# Patient Record
Sex: Male | Born: 1960 | Race: Black or African American | Hispanic: No | Marital: Single | Smoking: Current every day smoker
Health system: Southern US, Community
[De-identification: ages and names within clinical notes are randomized; demographics above are authoritative.]

## PROBLEM LIST (undated history)

## (undated) DIAGNOSIS — K219 Gastro-esophageal reflux disease without esophagitis: Secondary | ICD-10-CM

## (undated) DIAGNOSIS — J45909 Unspecified asthma, uncomplicated: Secondary | ICD-10-CM

## (undated) DIAGNOSIS — F1020 Alcohol dependence, uncomplicated: Secondary | ICD-10-CM

## (undated) DIAGNOSIS — J449 Chronic obstructive pulmonary disease, unspecified: Secondary | ICD-10-CM

## (undated) DIAGNOSIS — I10 Essential (primary) hypertension: Secondary | ICD-10-CM

## (undated) DIAGNOSIS — F1910 Other psychoactive substance abuse, uncomplicated: Secondary | ICD-10-CM

---

## 2004-10-13 ENCOUNTER — Emergency Department (HOSPITAL_COMMUNITY): Admission: EM | Admit: 2004-10-13 | Discharge: 2004-10-13 | Payer: Self-pay | Admitting: Emergency Medicine

## 2004-10-15 ENCOUNTER — Emergency Department (HOSPITAL_COMMUNITY): Admission: EM | Admit: 2004-10-15 | Discharge: 2004-10-15 | Payer: Self-pay | Admitting: Emergency Medicine

## 2004-10-18 ENCOUNTER — Emergency Department (HOSPITAL_COMMUNITY): Admission: EM | Admit: 2004-10-18 | Discharge: 2004-10-18 | Payer: Self-pay | Admitting: Emergency Medicine

## 2004-10-25 ENCOUNTER — Emergency Department (HOSPITAL_COMMUNITY): Admission: EM | Admit: 2004-10-25 | Discharge: 2004-10-25 | Payer: Self-pay | Admitting: Emergency Medicine

## 2008-01-20 ENCOUNTER — Emergency Department (HOSPITAL_COMMUNITY): Admission: EM | Admit: 2008-01-20 | Discharge: 2008-01-20 | Payer: Self-pay | Admitting: Emergency Medicine

## 2014-03-24 ENCOUNTER — Encounter (HOSPITAL_COMMUNITY): Payer: Self-pay | Admitting: Emergency Medicine

## 2014-03-24 ENCOUNTER — Emergency Department (HOSPITAL_COMMUNITY)
Admission: EM | Admit: 2014-03-24 | Discharge: 2014-03-24 | Disposition: A | Discharge status: Home / Self Care | Payer: Self-pay | Attending: Emergency Medicine | Admitting: Emergency Medicine

## 2014-03-24 DIAGNOSIS — L720 Epidermal cyst: Secondary | ICD-10-CM | POA: Insufficient documentation

## 2014-03-24 DIAGNOSIS — Z720 Tobacco use: Secondary | ICD-10-CM | POA: Insufficient documentation

## 2014-03-24 DIAGNOSIS — J45909 Unspecified asthma, uncomplicated: Secondary | ICD-10-CM | POA: Insufficient documentation

## 2014-03-24 HISTORY — DX: Unspecified asthma, uncomplicated: J45.909

## 2014-03-24 MED ORDER — LIDOCAINE HCL (PF) 1 % IJ SOLN
5.0000 mL | Freq: Once | INTRAMUSCULAR | Status: DC
  Filled 2014-03-24: qty 5

## 2014-03-24 MED ORDER — SULFAMETHOXAZOLE-TRIMETHOPRIM 800-160 MG PO TABS: 1.0000 | ORAL_TABLET | Freq: Two times a day (BID) | ORAL | Status: DC

## 2014-03-24 NOTE — ED Provider Notes (Signed)
CSN: [id number]     Arrival date & time 03/24/14  1941 History   First MD Initiated Contact with Patient 03/24/14 [phone]     Chief Complaint  Patient presents with  . Abscess     (Consider location/radiation/quality/duration/timing/severity/associated sxs/prior Treatment) HPI  Jesus Walker is a 53 y.o. male who presents to the Emergency Department complaining of recurring nodule to the right neck.  He states the symptoms have been present for two days.  He states the nodule usually swells up and then ruptures spontaneously.  He denies fever, chills, difficulty swallowing or breathing.     Past Medical History  Diagnosis Date  . Asthma    History reviewed. No pertinent past surgical history. History reviewed. No pertinent family history. History  Substance Use Topics  . Smoking status: Current Every Day Smoker -- 1.00 packs/day    Types: Cigarettes  . Smokeless tobacco: Not on file  . Alcohol Use: 7.2 oz/week    12 Cans of beer per week    Review of Systems  Constitutional: Negative for fever and chills.  Gastrointestinal: Negative for nausea and vomiting.  Musculoskeletal: Negative for arthralgias and joint swelling.  Skin: Positive for color change.       Abscess   Hematological: Negative for adenopathy.  All other systems reviewed and are negative.     Allergies  Review of patient's allergies indicates no known allergies.  Home Medications   Prior to Admission medications   Not on File   BP 177/71  Pulse 86  Temp(Src) 98.6 F (37 C) (Oral)  Resp 18  Ht 5\' 2"  (1.575 m)  Wt 130 lb (58.968 kg)  BMI 23.77 kg/m2  SpO2 97% Physical Exam  Nursing note and vitals reviewed. Constitutional: He is oriented to person, place, and time. He appears well-developed and well-nourished. No distress.  HENT:  Head: Normocephalic and atraumatic.  Mouth/Throat: Uvula is midline, oropharynx is clear and moist and mucous membranes are normal.  Neck: Normal range of motion.  Neck supple.  Cardiovascular: Normal rate, regular rhythm and normal heart sounds.   No murmur heard. Pulmonary/Chest: Effort normal and breath sounds normal. No respiratory distress.  Neurological: He is alert and oriented to person, place, and time. He exhibits normal muscle tone. Coordination normal.  Skin: Skin is warm and dry. No erythema.  Localized fluctuance nodule to the right superior neck just below the jawline.  No significant erythema, no drainage    ED Course  Procedures (including critical care time) Labs Review Labs Reviewed - No data to display  Imaging Review No results found.   EKG Interpretation None     INCISION AND DRAINAGE Performed by: Hale Bogus. Consent: Verbal consent obtained. Risks and benefits: risks, benefits and alternatives were discussed Type: abscess  Body area: right neck Anesthesia: local infiltration  Incision was made with a #11 scalpel.  Local anesthetic: lidocaine 1 % w/o epinephrine  Anesthetic total: 3 ml  Complexity: complex Blunt dissection to break up loculations  Drainage: purulent  Drainage amount: small Packing material: 1/4 in iodoform gauze  Patient tolerance: Patient tolerated the procedure well with no immediate complications.    MDM   Final diagnoses:  Epidermal cyst of neck   reoccurring abscess to the right neck.  Pt agrees to warm wet compresses, packing removal in 2 days, bactrim and recheck if needed    Joanie Duprey L. Vanessa [place], PA-C 03/25/14 2017

## 2014-03-24 NOTE — ED Notes (Signed)
Pt did not want to stay to have vitals reassessed- said he "needed to go."

## 2014-03-24 NOTE — Discharge Instructions (Signed)
Epidermal Cyst An epidermal cyst is usually a small, painless lump under the skin. Cysts often occur on the face, neck, stomach, chest, or genitals. The cyst may be filled with a bad smelling paste. Do not pop your cyst. Popping the cyst can cause pain and puffiness (swelling). HOME CARE   Only take medicines as told by your doctor.  Take your medicine (antibiotics) as told. Finish it even if you start to feel better. GET HELP RIGHT AWAY IF:  Your cyst is tender, red, or puffy.  You are not getting better, or you are getting worse.  You have any questions or concerns. MAKE SURE YOU:  Understand these instructions.  Will watch your condition.  Will get help right away if you are not doing well or get worse. Document Released: 06/26/2004 Document Revised: 11/18/2011 Document Reviewed: 11/25/2010 Exit[employer] Patient Information 2015 [employer]. This information is not intended to replace advice given to you by your health care provider. Make sure you discuss any questions you have with your health care provider.  

## 2014-03-24 NOTE — ED Notes (Signed)
Pt c/o abcess on right side of neck starting two days ago. Abscess is est. 1 in in diameter, just below jawline. Pt denies SOB (has asthma so some SOB is his baseline). Pt denies fevers or any other symptoms.

## 2014-03-28 NOTE — ED Provider Notes (Signed)
Medical screening examination/treatment/procedure(s) were performed by non-physician practitioner and as supervising physician I was immediately available for consultation/collaboration.   EKG Interpretation None        Maudry Diego, MD 03/28/14 [phone]

## 2014-05-02 ENCOUNTER — Emergency Department (HOSPITAL_COMMUNITY)
Admission: EM | Admit: 2014-05-02 | Discharge: 2014-05-02 | Disposition: A | Discharge status: Home / Self Care | Payer: Self-pay | Attending: Emergency Medicine | Admitting: Emergency Medicine

## 2014-05-02 ENCOUNTER — Emergency Department (HOSPITAL_COMMUNITY): Payer: Self-pay

## 2014-05-02 ENCOUNTER — Encounter (HOSPITAL_COMMUNITY): Payer: Self-pay | Admitting: Emergency Medicine

## 2014-05-02 DIAGNOSIS — Z792 Long term (current) use of antibiotics: Secondary | ICD-10-CM | POA: Insufficient documentation

## 2014-05-02 DIAGNOSIS — J40 Bronchitis, not specified as acute or chronic: Secondary | ICD-10-CM

## 2014-05-02 DIAGNOSIS — J45909 Unspecified asthma, uncomplicated: Secondary | ICD-10-CM | POA: Insufficient documentation

## 2014-05-02 DIAGNOSIS — Z79899 Other long term (current) drug therapy: Secondary | ICD-10-CM | POA: Insufficient documentation

## 2014-05-02 LAB — CBC WITH DIFFERENTIAL/PLATELET
BASOS ABS: 0 10*3/uL (ref 0.0–0.1)
Basophils Relative: 0 % (ref 0–1)
EOS PCT: 2 % (ref 0–5)
Eosinophils Absolute: 0.1 10*3/uL (ref 0.0–0.7)
HCT: 48 % (ref 39.0–52.0)
HEMOGLOBIN: 16.7 g/dL (ref 13.0–17.0)
Lymphocytes Relative: 30 % (ref 12–46)
Lymphs Abs: 1.9 10*3/uL (ref 0.7–4.0)
MCH: 34.3 pg — ABNORMAL HIGH (ref 26.0–34.0)
MCHC: 34.8 g/dL (ref 30.0–36.0)
MCV: 98.6 fL (ref 78.0–100.0)
MONOS PCT: 10 % (ref 3–12)
Monocytes Absolute: 0.6 10*3/uL (ref 0.1–1.0)
Neutro Abs: 3.9 10*3/uL (ref 1.7–7.7)
Neutrophils Relative %: 58 % (ref 43–77)
PLATELETS: 209 10*3/uL (ref 150–400)
RBC: 4.87 MIL/uL (ref 4.22–5.81)
RDW: 12.5 % (ref 11.5–15.5)
WBC: 6.6 10*3/uL (ref 4.0–10.5)

## 2014-05-02 LAB — BASIC METABOLIC PANEL
Anion gap: 10 (ref 5–15)
BUN: 7 mg/dL (ref 6–23)
CO2: 32 meq/L (ref 19–32)
Calcium: 10.1 mg/dL (ref 8.4–10.5)
Chloride: 96 mEq/L (ref 96–112)
Creatinine, Ser: 0.97 mg/dL (ref 0.50–1.35)
GFR calc Af Amer: >90 mL/min (ref >90)
GFR calc non Af Amer: >90 mL/min (ref >90)
GLUCOSE: 102 mg/dL — AB (ref 70–99)
POTASSIUM: 5.4 meq/L — AB (ref 3.7–5.3)
SODIUM: 138 meq/L (ref 137–147)

## 2014-05-02 LAB — TROPONIN I: Troponin I: <0.3 ng/mL (ref <0.30)

## 2014-05-02 MED ORDER — ALBUTEROL SULFATE HFA 108 (90 BASE) MCG/ACT IN AERS
1.0000 | INHALATION_SPRAY | RESPIRATORY_TRACT | Status: DC
  Administered 2014-05-02: 2 via RESPIRATORY_TRACT
  Filled 2014-05-02: qty 6.7

## 2014-05-02 MED ORDER — PREDNISONE 50 MG PO TABS: 50.0000 mg | ORAL_TABLET | Freq: Every day | ORAL | Status: DC

## 2014-05-02 NOTE — ED Notes (Signed)
Pt reports productive cough,chills x3 months. nad noted. Pt denies any known fevers.

## 2014-05-02 NOTE — ED Provider Notes (Signed)
CSN: [id number]     Arrival date & time 05/02/14  9485 History   First MD Initiated Contact with Patient 05/02/14 [phone]     Chief Complaint  Patient presents with  . Cough     (Consider location/radiation/quality/duration/timing/severity/associated sxs/prior Treatment) HPI Comments: Pt comes in with cough and left chest pain intermittently over the last 3 months. Denies fevers. Hasn't tried anything for the symptoms. States that the pain on his left chest is an aching and comes and goes, worse with palpation. Denies any medical problem but states that he hasn't gone to a doctor in several years. He came in today because family convinced him. He states that a lot of this feels like his asthma. He hasn't used any medications.  No n/v, diaphoresis with symptoms  The history is provided by the patient. No language interpreter was used.    Past Medical History  Diagnosis Date  . Asthma    History reviewed. No pertinent past surgical history. History reviewed. No pertinent family history. History  Substance Use Topics  . Smoking status: Current Every Day Smoker -- 1.00 packs/day    Types: Cigarettes  . Smokeless tobacco: Not on file  . Alcohol Use: 7.2 oz/week    12 Cans of beer per week    Review of Systems  All other systems reviewed and are negative.     Allergies  Review of patient's allergies indicates no known allergies.  Home Medications   Prior to Admission medications   Medication Sig Start Date End Date Taking? Authorizing Provider  sulfamethoxazole-trimethoprim (SEPTRA DS) 800-160 MG per tablet Take 1 tablet by mouth 2 (two) times daily. For 10 days Patient not taking: Reported on 05/02/2014 03/24/14   Tammy L. Triplett, PA-C   BP 180/66 mmHg  Pulse 92  Temp(Src) 97.6 F (36.4 C) (Oral)  Resp 18  Ht 5\' 2"  (1.575 m)  Wt 135 lb (61.236 kg)  BMI 24.69 kg/m2  SpO2 97% Physical Exam  Constitutional: He is oriented to person, place, and time. He appears  well-developed and well-nourished.  HENT:  Head: Normocephalic and atraumatic.  Left Ear: External ear normal.  Neck: Normal range of motion. Neck supple.  Cardiovascular: Normal rate and regular rhythm.   Pulmonary/Chest: Effort normal and breath sounds normal.  Musculoskeletal: Normal range of motion.  Neurological: He is alert and oriented to person, place, and time.  Skin: Skin is warm and dry.  Psychiatric: He has a normal mood and affect.  Nursing note and vitals reviewed.   ED Course  Procedures (including critical care time) Labs Review Labs Reviewed  CBC WITH DIFFERENTIAL - Abnormal; Notable for the following:    MCH 34.3 (*)    All other components within normal limits  BASIC METABOLIC PANEL - Abnormal; Notable for the following:    Potassium 5.4 (*)    Glucose, Bld 102 (*)    All other components within normal limits  TROPONIN I    Imaging Review Dg Chest 2 View  05/02/2014   CLINICAL DATA:  Cough, asthma, smoker  EXAM: CHEST  2 VIEW  COMPARISON:  None.  FINDINGS: Cardiomediastinal silhouette is unremarkable. No acute infiltrate or pleural effusion. No pulmonary edema. Central mild bronchitic changes. Bony thorax is unremarkable.  IMPRESSION: No acute infiltrate or pulmonary edema. Central mild bronchitic changes.   Electronically Signed   By: Lahoma Crocker M.D.   On: 05/02/2014 10:43     Date: 05/02/2014  Rate: 76  Rhythm: normal sinus rhythm  QRS Axis: left  Intervals: QT prolonged  ST/T Wave abnormalities: early repolarization  Conduction Disutrbances:none  Narrative Interpretation:   Old EKG Reviewed: none available    MDM   Final diagnoses:  Bronchitis    Likely acute on chronic bronchitis. Will give inhaler for the symptoms. Don't think antibiotics are needed at this time.    Glendell Docker, NP 05/02/14 [place], MD 05/02/14 [phone]

## 2015-12-26 ENCOUNTER — Emergency Department (HOSPITAL_COMMUNITY): Payer: Medicaid Other

## 2015-12-26 ENCOUNTER — Encounter (HOSPITAL_COMMUNITY): Payer: Self-pay | Admitting: Emergency Medicine

## 2015-12-26 ENCOUNTER — Emergency Department (HOSPITAL_COMMUNITY)
Admission: EM | Admit: 2015-12-26 | Discharge: 2015-12-26 | Disposition: A | Discharge status: Home / Self Care | Payer: Medicaid Other | Attending: Emergency Medicine | Admitting: Emergency Medicine

## 2015-12-26 DIAGNOSIS — R911 Solitary pulmonary nodule: Secondary | ICD-10-CM | POA: Insufficient documentation

## 2015-12-26 DIAGNOSIS — Z79899 Other long term (current) drug therapy: Secondary | ICD-10-CM | POA: Insufficient documentation

## 2015-12-26 DIAGNOSIS — R0789 Other chest pain: Secondary | ICD-10-CM | POA: Diagnosis not present

## 2015-12-26 DIAGNOSIS — R079 Chest pain, unspecified: Secondary | ICD-10-CM | POA: Diagnosis present

## 2015-12-26 DIAGNOSIS — J9819 Other pulmonary collapse: Secondary | ICD-10-CM

## 2015-12-26 DIAGNOSIS — R591 Generalized enlarged lymph nodes: Secondary | ICD-10-CM | POA: Diagnosis not present

## 2015-12-26 DIAGNOSIS — R072 Precordial pain: Secondary | ICD-10-CM

## 2015-12-26 DIAGNOSIS — I10 Essential (primary) hypertension: Secondary | ICD-10-CM | POA: Diagnosis not present

## 2015-12-26 DIAGNOSIS — R590 Localized enlarged lymph nodes: Secondary | ICD-10-CM

## 2015-12-26 DIAGNOSIS — J45909 Unspecified asthma, uncomplicated: Secondary | ICD-10-CM | POA: Diagnosis not present

## 2015-12-26 DIAGNOSIS — J189 Pneumonia, unspecified organism: Secondary | ICD-10-CM | POA: Diagnosis not present

## 2015-12-26 DIAGNOSIS — R918 Other nonspecific abnormal finding of lung field: Secondary | ICD-10-CM | POA: Diagnosis not present

## 2015-12-26 DIAGNOSIS — F17210 Nicotine dependence, cigarettes, uncomplicated: Secondary | ICD-10-CM | POA: Insufficient documentation

## 2015-12-26 HISTORY — DX: Essential (primary) hypertension: I10

## 2015-12-26 LAB — BASIC METABOLIC PANEL
ANION GAP: 6 (ref 5–15)
BUN: 8 mg/dL (ref 6–20)
CO2: 29 mmol/L (ref 22–32)
Calcium: 8.2 mg/dL — ABNORMAL LOW (ref 8.9–10.3)
Chloride: 95 mmol/L — ABNORMAL LOW (ref 101–111)
Creatinine, Ser: 0.79 mg/dL (ref 0.61–1.24)
GFR calc Af Amer: >60 mL/min (ref >60)
GLUCOSE: 117 mg/dL — AB (ref 65–99)
POTASSIUM: 3.1 mmol/L — AB (ref 3.5–5.1)
Sodium: 130 mmol/L — ABNORMAL LOW (ref 135–145)

## 2015-12-26 LAB — CBC
HCT: 36 % — ABNORMAL LOW (ref 39.0–52.0)
Hemoglobin: 12.1 g/dL — ABNORMAL LOW (ref 13.0–17.0)
MCH: 33.5 pg (ref 26.0–34.0)
MCHC: 33.6 g/dL (ref 30.0–36.0)
MCV: 99.7 fL (ref 78.0–100.0)
PLATELETS: 182 10*3/uL (ref 150–400)
RBC: 3.61 MIL/uL — ABNORMAL LOW (ref 4.22–5.81)
RDW: 12.2 % (ref 11.5–15.5)
WBC: 10.6 10*3/uL — AB (ref 4.0–10.5)

## 2015-12-26 LAB — I-STAT TROPONIN, ED: TROPONIN I, POC: 0 ng/mL (ref 0.00–0.08)

## 2015-12-26 LAB — D-DIMER, QUANTITATIVE (NOT AT ARMC): D DIMER QUANT: 0.84 ug{FEU}/mL — AB (ref 0.00–0.50)

## 2015-12-26 MED ORDER — SODIUM CHLORIDE 0.9 % IV BOLUS (SEPSIS)
1000.0000 mL | Freq: Once | INTRAVENOUS | Status: AC
Start: 2015-12-26 — End: 2015-12-26
  Administered 2015-12-26: 1000 mL via INTRAVENOUS

## 2015-12-26 MED ORDER — IOPAMIDOL (ISOVUE-370) INJECTION 76%
100.0000 mL | Freq: Once | INTRAVENOUS | Status: AC | PRN
  Administered 2015-12-26: 100 mL via INTRAVENOUS

## 2015-12-26 MED ORDER — DEXTROSE 5 % IV SOLN
1.0000 g | Freq: Once | INTRAVENOUS | Status: AC
  Administered 2015-12-26: 1 g via INTRAVENOUS
  Filled 2015-12-26: qty 10

## 2015-12-26 MED ORDER — LEVOFLOXACIN 750 MG PO TABS: 750.0000 mg | ORAL_TABLET | Freq: Every day | ORAL | 0 refills | Status: DC

## 2015-12-26 MED ORDER — AZITHROMYCIN 500 MG IV SOLR
500.0000 mg | Freq: Once | INTRAVENOUS | Status: AC
  Administered 2015-12-26: 500 mg via INTRAVENOUS
  Filled 2015-12-26: qty 500

## 2015-12-26 NOTE — Consult Note (Signed)
Requesting physician: Montine Circle, ED PA  Primary Care Physician: No PCP Per Patient  Reason for consultation: Chest pain, pneumonia, possible lung mass   History of Present Illness: Patient is a 55 year old man with history of tobacco abuse who comes into the hospital today with a 12 hour history of chest pain and productive cough. Chest pain is not related to exertion, he cannot pinpoint a particular area that bothers him the most, no shortness of breath. In the ED EKG shows no acute ischemia, first troponin is negative. CT chest was obtained to rule out pulmonary embolus and this shows a complete right middle lobe collapse with central occlusion of the right middle lobe bronchus, central obstructing process is a concern, right hilar and subcarinal lymphadenopathy. There is also 2.4 cm irregular nodular opacity in the posterior left lung that could represent metastatic disease or infectious process. Consultation has been requested for potential admission.  Allergies:  No Known Allergies    Past Medical History:  Diagnosis Date  . Asthma   . Hypertension     History reviewed. No pertinent surgical history.  Scheduled Meds:  Continuous Infusions: . azithromycin 500 mg (12/26/15 1343)   PRN Meds:.  Social History:  reports that he has been smoking Cigarettes.  He has been smoking about 1.00 pack per day. He has never used smokeless tobacco. He reports that he drinks about 7.2 oz of alcohol per week . He reports that he does not use drugs.  Family history: No history of cardiac disease or stroke in parents.  Review of Systems:  Constitutional: Denies fever, chills, diaphoresis, appetite change and fatigue.  HEENT: Denies photophobia, eye pain, redness, hearing loss, ear pain, congestion, sore throat, rhinorrhea, sneezing, mouth sores, trouble swallowing, neck pain, neck stiffness and tinnitus.   Respiratory: Denies SOB, DOE, , chest tightness,  and wheezing.     Cardiovascular: Denies chest pain, palpitations and leg swelling.  Gastrointestinal: Denies nausea, vomiting, abdominal pain, diarrhea, constipation, blood in stool and abdominal distention.  Genitourinary: Denies dysuria, urgency, frequency, hematuria, flank pain and difficulty urinating.  Endocrine: Denies: hot or cold intolerance, sweats, changes in hair or nails, polyuria, polydipsia. Musculoskeletal: Denies myalgias, back pain, joint swelling, arthralgias and gait problem.  Skin: Denies pallor, rash and wound.  Neurological: Denies dizziness, seizures, syncope, weakness, light-headedness, numbness and headaches.  Hematological: Denies adenopathy. Easy bruising, personal or family bleeding history  Psychiatric/Behavioral: Denies suicidal ideation, mood changes, confusion, nervousness, sleep disturbance and agitation   Physical Exam: Blood pressure 110/74, pulse 109, temperature 98.3 F (36.8 C), temperature source Oral, resp. rate 17, height '5\' 1"'$  (1.549 m), weight 52.6 kg (116 lb), SpO2 98 %. General: Alert, awake, oriented 3, no acute distress, appears chronically ill and malnourished HEENT: Normocephalic, atraumatic, pupils equal and reactive to light, extra movements intact Neck: Supple, no JVD, no lymphadenopathy, no bruits, no goiter Cardiovascular: Regular rate and rhythm, no murmurs, rubs or gallops Lungs: Clear to auscultation bilaterally Abdomen: Soft, nontender, nondistended, positive bowel sounds next line extremities: No clubbing, cyanosis or edema, positive pulses Neurologic: Grossly intact and nonfocal  Labs on Admission:  Results for orders placed or performed during the hospital encounter of 12/26/15 (from the past 48 hour(s))  Basic metabolic panel     Status: Abnormal   Collection Time: 12/26/15  8:15 AM  Result Value Ref Range   Sodium 130 (L) 135 - 145 mmol/L   Potassium 3.1 (L) 3.5 - 5.1 mmol/L   Chloride 95 (L) 101 -  111 mmol/L   CO2 29 22 - 32 mmol/L    Glucose, Bld 117 (H) 65 - 99 mg/dL   BUN 8 6 - 20 mg/dL   Creatinine, Ser 0.64 0.61 - 1.24 mg/dL   Calcium 8.2 (L) 8.9 - 10.3 mg/dL   GFR calc non Af Amer >60 >60 mL/min   GFR calc Af Amer >60 >60 mL/min    Comment: (NOTE) The eGFR has been calculated using the CKD EPI equation. This calculation has not been validated in all clinical situations. eGFR's persistently <60 mL/min signify possible Chronic Kidney Disease.    Anion gap 6 5 - 15  CBC     Status: Abnormal   Collection Time: 12/26/15  8:15 AM  Result Value Ref Range   WBC 10.6 (H) 4.0 - 10.5 K/uL   RBC 3.61 (L) 4.22 - 5.81 MIL/uL   Hemoglobin 12.1 (L) 13.0 - 17.0 g/dL   HCT 61.4 (L) 01.2 - 03.5 %   MCV 99.7 78.0 - 100.0 fL   MCH 33.5 26.0 - 34.0 pg   MCHC 33.6 30.0 - 36.0 g/dL   RDW 81.3 62.5 - 22.6 %   Platelets 182 150 - 400 K/uL  D-dimer, quantitative (not at [facility])     Status: Abnormal   Collection Time: 12/26/15  8:15 AM  Result Value Ref Range   D-Dimer, Quant 0.84 (H) 0.00 - 0.50 ug/mL-FEU    Comment: (NOTE) At the manufacturer cut-off of 0.50 ug/mL FEU, this assay has been documented to exclude PE with a sensitivity and negative predictive value of 97 to 99%.  At this time, this assay has not been approved by the FDA to exclude DVT/VTE. Results should be correlated with clinical presentation.   I-stat troponin, ED     Status: None   Collection Time: 12/26/15  8:17 AM  Result Value Ref Range   Troponin i, poc 0.00 0.00 - 0.08 ng/mL   Comment 3            Comment: Due to the release kinetics of cTnI, a negative result within the first hours of the onset of symptoms does not rule out myocardial infarction with certainty. If myocardial infarction is still suspected, repeat the test at appropriate intervals.     Radiological Exams on Admission: Dg Chest 2 View  Result Date: 12/26/2015 CLINICAL DATA:  Chest pain. EXAM: CHEST  2 VIEW COMPARISON:  05/02/2014. FINDINGS: Mediastinum hilar structures are  normal. Elliptical density is noted over the right moderate fissure. 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 Right lower lobe atelectasis and/or infiltrate. Heart size normal. Thoracic spine scoliosis. IMPRESSION: 1. Prominent elliptical density noted over the right minor fissure. 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 2. Right lower lobe atelectasis and or infiltrate. Follow-up chest x-rays recommended to demonstrate clearing of these findings. Electronically Signed   By: Maisie Fus  Register   On: 12/26/2015 09:18  Ct Angio Chest Pe W Or Wo Contrast  Result Date: 12/26/2015 CLINICAL DATA:  Left anterior side chest pain with left shoulder pain for 2 months. EXAM: CT ANGIOGRAPHY CHEST WITH CONTRAST TECHNIQUE: Multidetector CT imaging of the chest was performed using the standard protocol during bolus administration of intravenous contrast. Multiplanar CT image reconstructions and MIPs were obtained to evaluate the vascular anatomy. CONTRAST:  100 cc Isovue 370 down COMPARISON:  None. FINDINGS: Cardiovascular: Heart size normal. No pericardial effusion. No thoracic aortic dissection. No filling defects in the opacified pulmonary arteries to suggest the presence of an acute pulmonary  embolus. Mediastinum/Nodes: 14 mm short axis subcarinal lymph node evident. Otherwise no mediastinal lymphadenopathy. No left hilar lymphadenopathy. 17 mm short axis right hilar lymph node identified. Abnormal soft tissue attenuation identified in the inferior right hilum is confluent with the right basilar collapse/consolidation. Lungs/Pleura: Complete collapse right middle lobe noted. Right middle lobe bronchus is obliterated centrally. Patchy airspace disease noted posterior right lung base. Nodular peripheral airspace opacity in the left lower lobe measures 2.4 cm. Upper Abdomen: Unremarkable. Musculoskeletal: Bone windows reveal no worrisome lytic or sclerotic osseous lesions. IMPRESSION: 1. Complete right middle lobe collapse  with central occlusion of the right middle lobe bronchus. Although no discrete lesion is evident, central obstructing process is a concern. 2. Right hilar and subcarinal lymphadenopathy. 3. PET-CT may prove helpful to assess hypermetabolism has neoplastic process is a concern. 4. 2.4 cm irregular nodular opacity posterior left lung appears less nodular on the sagittal projections. This could represent metastatic disease or infectious process. This could also be further assessed at PET-CT imaging. 5. No CT evidence for acute pulmonary embolus. Electronically Signed   By: Misty Stanley M.D.   On: 12/26/2015 12:55   Assessment/Plan Active Problems:   Chest pain   Lung mass   CAP (community acquired pneumonia)    Chest pain/lung mass/community-acquired pneumonia -In this patient with long-standing tobacco abuse, primary lung malignancy is certainly a consideration and at the top of the list. -He also may have a postobstructive pneumonia, especially given his productive cough. -In absence of signs and symptoms concerning for sepsis, I believe it is safe to have him discharged home today to complete an outpatient course of antibiotics for his community-acquired pneumonia. I would suggest Levaquin 750 mg for 8 days. -It is important that he have follow-up arranged with oncology to further discuss workup for this potential lung malignancy. Patient has been informed of importance of compliance.  Time Spent on Consultation: 65 minutes  [facility]  [phone] 12/26/2015, 2:29 PM

## 2015-12-26 NOTE — ED Notes (Signed)
Pt ambulated in ER.  Pt states he did not become more short of breath than normal, but states if he goes a long ways he does get short of breath.

## 2015-12-26 NOTE — ED Notes (Signed)
Pt aware CT scanner is down until 12 for updating.  Provided him and significant other with crackers and gingerale.  Will order lunch tray due to wait time.  Pt in agreement with waiting.  Denies any needs at this time.

## 2015-12-26 NOTE — ED Triage Notes (Signed)
C/o chest pain and congestion for last 3-4 weeks.  C/o pressure.  Rates pain 10/10 early morning and pain decreases 5/10.  Pain radiates to left arm and lower back.  Not on any ASA currently.

## 2015-12-26 NOTE — ED Provider Notes (Signed)
1435:  T/C to Pulmonology Dr. Luan Pulling, case discussed, including:  HPI, pertinent PM/SHx, VS/PE, dx testing, ED course and treatment:  Agrees pt needs further evaluation with bronchoscopy, states he will have office call pt to schedule for next week.  T/C to Heme/Onc Dr. Whitney Muse, case discussed, including:  HPI, pertinent PM/SHx, VS/PE, dx testing, ED course and treatment:  Agrees pt needs f/u for pulmonary nodule, states she will have office call pt to schedule appt.    Francine Graven, DO 12/26/15 1456

## 2015-12-26 NOTE — ED Provider Notes (Signed)
[facility] DEPT Provider Note   CSN: [id number] Arrival date & time: 12/26/15  [id number]  First Provider Contact:  First MD Initiated Contact with Patient 12/26/15 [phone]        History   Chief Complaint Chief Complaint  Patient presents with  . Chest Pain    3 weeks    HPI Jesus Walker is a 55 y.o. male.  Patient with PMH of asthma and HTN presents to the ED with a chief complaint of chest pain.  He reports having gradually worsening chest pain x 1 month.  States that he has pain in the center of his chest and on the side of his ribs.  He denies having any wheezing, but has had a cough.  He denies any associated fevers, chills, nausea, or vomiting.  The symptoms are worsened with exertion and with inspiration.  He has tried taking aleve with no relief.  He denies any history of ACS/PE.  He denies any other associated symptoms.   The history is provided by the patient. No language interpreter was used.    Past Medical History:  Diagnosis Date  . Asthma   . Hypertension     There are no active problems to display for this patient.   History reviewed. No pertinent surgical history.     Home Medications    Prior to Admission medications   Medication Sig Start Date End Date Taking? Authorizing Provider  predniSONE (DELTASONE) 50 MG tablet Take 1 tablet (50 mg total) by mouth daily. 05/02/14   Glendell Docker, NP  sulfamethoxazole-trimethoprim (SEPTRA DS) 800-160 MG per tablet Take 1 tablet by mouth 2 (two) times daily. For 10 days Patient not taking: Reported on 05/02/2014 03/24/14   Kem Parkinson, PA-C    Family History History reviewed. No pertinent family history.  Social History Social History  Substance Use Topics  . Smoking status: Current Every Day Smoker    Packs/day: 1.00    Types: Cigarettes  . Smokeless tobacco: Never Used  . Alcohol use 7.2 oz/week    12 Cans of beer per week     Allergies   Review of patient's allergies indicates no known  allergies.   Review of Systems Review of Systems  Respiratory: Positive for shortness of breath.   Cardiovascular: Positive for chest pain.  All other systems reviewed and are negative.    Physical Exam Updated Vital Signs BP 162/66 (BP Location: Right Arm)   Pulse 104   Temp 98.3 F (36.8 C) (Oral)   Resp 22   Ht 5\' 1"  (1.549 m)   Wt 52.6 kg   SpO2 100%   BMI 21.92 kg/m   Physical Exam  Constitutional: He is oriented to person, place, and time. He appears well-developed and well-nourished.  HENT:  Head: Normocephalic and atraumatic.  Eyes: Conjunctivae and EOM are normal. Pupils are equal, round, and reactive to light. Right eye exhibits no discharge. Left eye exhibits no discharge. No scleral icterus.  Neck: Normal range of motion. Neck supple. No JVD present.  Cardiovascular: Regular rhythm and normal heart sounds.  Exam reveals no gallop and no friction rub.   No murmur heard. tachycardic  Pulmonary/Chest: Effort normal and breath sounds normal. No respiratory distress. He has no wheezes. He has no rales. He exhibits no tenderness.  Abdominal: Soft. He exhibits no distension and no mass. There is no tenderness. There is no rebound and no guarding.  Musculoskeletal: Normal range of motion. He exhibits no edema or tenderness.  Neurological: He is alert and oriented to person, place, and time.  Skin: Skin is warm and dry.  Psychiatric: He has a normal mood and affect. His behavior is normal. Judgment and thought content normal.  Nursing note and vitals reviewed.    ED Treatments / Results  Labs (all labs ordered are listed, but only abnormal results are displayed) Labs Reviewed  CBC - Abnormal; Notable for the following:       Result Value   WBC 10.6 (*)    RBC 3.61 (*)    Hemoglobin 12.1 (*)    HCT 36.0 (*)    All other components within normal limits  BASIC METABOLIC PANEL  D-DIMER, QUANTITATIVE (NOT AT [facility])  I-STAT TROPOININ, ED    EKG  EKG  Interpretation  Date/Time:  Wednesday December 26 2015 07:57:52 EDT Ventricular Rate:  104 PR Interval:    QRS Duration: 85 QT Interval:  335 QTC Calculation: 441 R Axis:   73 Text Interpretation:  Sinus tachycardia When compared with ECG of 05/02/2014 Rate faster QT has shortened Confirmed by [facility]  MD, Nunzio Cory ([id number]) on 12/26/2015 8:14:21 AM       Radiology No results found.  Procedures Procedures (including critical care time)  Medications Ordered in ED Medications - No data to display   Initial Impression / Assessment and Plan / ED Course  I have reviewed the triage vital signs and the nursing notes.  Pertinent labs & imaging results that were available during my care of the patient were reviewed by me and considered in my medical decision making (see chart for details).  Clinical Course    Patient with pleuritic chest pain x 1 month.  Gradually worsening.  Hx of asthma, but no wheezing.  Mildly tachycardic.  No leg swelling.  Cannot use PERC, will check d-dimer.    CT PE study as above.  Patient and workup discussed with Dr. Thurnell Garbe, who recommends hospitalist admission.  Patient may need bronchoscopy.  Will also start rocephin and azithromycin for pneumonia.  No recent hospitalizations.    2:26 PM Patient discussed with Dr. Jerilee Hoh from Hospitalist team.  Recommends treatment of pneumonia on outpatient basis with levaquin and follow-up with oncology.  Will consult formally on patient.    Will consult oncology to arrange for close follow-up.  Discussed patient with Dr. Whitney Muse of oncology who will see the patient in follow-up.  Dr. Thurnell Garbe also discussed the patient with Dr. Luan Pulling of pulmonology who will see patient for bronchoscopy.  Patient is not hypoxic or vomiting.  Stable for discharge and outpatient follow-up. Final Clinical Impressions(s) / ED Diagnoses   Final diagnoses:  Community acquired pneumonia  Lung nodule  Hilar lymphadenopathy  Collapsed lung     New Prescriptions New Prescriptions   LEVOFLOXACIN (LEVAQUIN) 750 MG TABLET    Take 1 tablet (750 mg total) by mouth daily.     Montine Circle, PA-C 12/26/15 [place], DO 12/29/15 [id number]

## 2016-01-03 ENCOUNTER — Ambulatory Visit (HOSPITAL_COMMUNITY)
Admission: RE | Admit: 2016-01-03 | Discharge: 2016-01-03 | Disposition: A | Discharge status: Home / Self Care | Payer: Medicaid Other | Source: Ambulatory Visit | Attending: Pulmonary Disease | Admitting: Pulmonary Disease

## 2016-01-03 ENCOUNTER — Encounter (HOSPITAL_COMMUNITY)
Admission: RE | Disposition: A | Discharge status: Home / Self Care | Payer: Self-pay | Source: Ambulatory Visit | Attending: Pulmonary Disease

## 2016-01-03 ENCOUNTER — Encounter (HOSPITAL_COMMUNITY): Payer: Self-pay | Admitting: Anesthesiology

## 2016-01-03 DIAGNOSIS — J45909 Unspecified asthma, uncomplicated: Secondary | ICD-10-CM | POA: Diagnosis not present

## 2016-01-03 DIAGNOSIS — Z79899 Other long term (current) drug therapy: Secondary | ICD-10-CM | POA: Insufficient documentation

## 2016-01-03 DIAGNOSIS — J9819 Other pulmonary collapse: Secondary | ICD-10-CM | POA: Diagnosis not present

## 2016-01-03 DIAGNOSIS — I10 Essential (primary) hypertension: Secondary | ICD-10-CM | POA: Diagnosis not present

## 2016-01-03 DIAGNOSIS — F17210 Nicotine dependence, cigarettes, uncomplicated: Secondary | ICD-10-CM | POA: Insufficient documentation

## 2016-01-03 DIAGNOSIS — M419 Scoliosis, unspecified: Secondary | ICD-10-CM | POA: Insufficient documentation

## 2016-01-03 DIAGNOSIS — R918 Other nonspecific abnormal finding of lung field: Secondary | ICD-10-CM | POA: Diagnosis present

## 2016-01-03 HISTORY — PX: FLEXIBLE BRONCHOSCOPY: SHX5094

## 2016-01-03 HISTORY — PX: BRONCHIAL BRUSHINGS: SHX5108

## 2016-01-03 HISTORY — PX: BRONCHIAL WASHINGS: SHX5105

## 2016-01-03 SURGERY — BRONCHOSCOPY, FLEXIBLE
Anesthesia: Moderate Sedation

## 2016-01-03 MED ORDER — IPRATROPIUM-ALBUTEROL 0.5-2.5 (3) MG/3ML IN SOLN
RESPIRATORY_TRACT | Status: AC
  Filled 2016-01-03: qty 3

## 2016-01-03 MED ORDER — MIDAZOLAM HCL 10 MG/2ML IJ SOLN
INTRAMUSCULAR | Status: DC | PRN
  Administered 2016-01-03 (×4): 2.5 mg via INTRAVENOUS

## 2016-01-03 MED ORDER — LIDOCAINE HCL (PF) 2 % IJ SOLN
INTRAMUSCULAR | Status: DC | PRN
  Administered 2016-01-03: 5 mL via INTRADERMAL

## 2016-01-03 MED ORDER — SODIUM CHLORIDE 0.9 % IV SOLN
INTRAVENOUS | Status: DC
Start: 2016-01-03 — End: 2016-01-03

## 2016-01-03 MED ORDER — MIDAZOLAM HCL 5 MG/5ML IJ SOLN
INTRAMUSCULAR | Status: AC
  Filled 2016-01-03: qty 5

## 2016-01-03 MED ORDER — IPRATROPIUM-ALBUTEROL 0.5-2.5 (3) MG/3ML IN SOLN
3.0000 mL | RESPIRATORY_TRACT | Status: DC | PRN
  Administered 2016-01-03: 3 mL via RESPIRATORY_TRACT

## 2016-01-03 MED ORDER — BUTAMBEN-TETRACAINE-BENZOCAINE 2-2-14 % EX AERO
INHALATION_SPRAY | CUTANEOUS | Status: DC | PRN
  Administered 2016-01-03: 2 via TOPICAL

## 2016-01-03 MED ORDER — LIDOCAINE HCL (PF) 2 % IJ SOLN
INTRAMUSCULAR | Status: AC
  Filled 2016-01-03: qty 20

## 2016-01-03 MED ORDER — LIDOCAINE VISCOUS 2 % MT SOLN
OROMUCOSAL | Status: AC
  Filled 2016-01-03: qty 15

## 2016-01-03 MED ORDER — LIDOCAINE VISCOUS 2 % MT SOLN
OROMUCOSAL | Status: DC | PRN
  Administered 2016-01-03 (×3): 2 mL via OROMUCOSAL

## 2016-01-03 SURGICAL SUPPLY — 16 items
BRUSH CYTOL CELLEBRITY 1.5X140 (MISCELLANEOUS) ×4 IMPLANT
CLOTH BEACON ORANGE TIMEOUT ST (SAFETY) ×4 IMPLANT
CONNECTOR 5 IN 1 STRAIGHT STRL (MISCELLANEOUS) ×4 IMPLANT
FORCEPS BIOP RJ4 1.8 (CUTTING FORCEPS) ×4 IMPLANT
GLOVE BIO SURGEON STRL SZ7.5 (GLOVE) ×4 IMPLANT
KIT CLEAN CATCH URINE (SET/KITS/TRAYS/PACK) IMPLANT
MARKER SKIN DUAL TIP RULER LAB (MISCELLANEOUS) ×4 IMPLANT
NS IRRIG 1000ML POUR BTL (IV SOLUTION) ×4 IMPLANT
SPONGE GAUZE 4X4 12PLY (GAUZE/BANDAGES/DRESSINGS) ×4 IMPLANT
SYR 20CC LL (SYRINGE) ×4 IMPLANT
SYR 30ML LL (SYRINGE) ×4 IMPLANT
SYR CONTROL 10ML LL (SYRINGE) ×4 IMPLANT
TRAP SPECIMEN CP (MISCELLANEOUS) ×4 IMPLANT
VALVE DISPOSABLE (MISCELLANEOUS) ×4 IMPLANT
WATER STERILE IRR 1000ML POUR (IV SOLUTION) ×4 IMPLANT
YANKAUER SUCT BULB TIP 10FT TU (MISCELLANEOUS) ×8 IMPLANT

## 2016-01-03 NOTE — Discharge Instructions (Signed)
Flexible Bronchoscopy, Care After °These instructions give you information on caring for yourself after your procedure. Your doctor may also give you more specific instructions. Call your doctor if you have any problems or questions after your procedure. °HOME CARE °· Do not eat or drink anything for 2 hours after your procedure. If you try to eat or drink before the medicine wears off, food or drink could go into your lungs. You could also burn yourself. °· After 2 hours have passed and when you can cough and gag normally, you may eat soft food and drink liquids slowly. °· The day after the test, you may eat your normal diet. °· You may do your normal activities. °· Keep all doctor visits. °GET HELP RIGHT AWAY IF: °· You get more and more short of breath. °· You get light-headed. °· You feel like you are going to pass out (faint). °· You have chest pain. °· You have new problems that worry you. °· You cough up more than a little blood. °· You cough up more blood than before. °MAKE SURE YOU: °· Understand these instructions. °· Will watch your condition. °· Will get help right away if you are not doing well or get worse. °  °This information is not intended to replace advice given to you by your health care provider. Make sure you discuss any questions you have with your health care provider. °  °Document Released: 03/16/2009 Document Revised: 05/24/2013 Document Reviewed: 01/21/2013 °Elsevier Interactive Patient Education ©2016 [employer] ° °

## 2016-01-03 NOTE — Progress Notes (Signed)
Patient awake and alert.  Able to ambulate to chair.  97% on room air and has no complaints.  Encouraged to cough up sputum which is pink tinged in color.  Dr Luan Pulling notified and is comfortable with discharging patient at this time.

## 2016-01-03 NOTE — Progress Notes (Signed)
O2 sats maintaining only at 83-85% on room air.  Minimal cough, with rhonchi to all lung fields.  Placed on 5 liters [place] O2 to bring sat to 97%.  Dr Luan Pulling notified.  Ordered Duoneb to be given now and in two hours, if status does not improve.

## 2016-01-03 NOTE — H&P (Signed)
Jesus Walker MRN: [id number] DOB/AGE: Oct 31, 1960 55 y.o. Primary Care Physician:No PCP Per Patient Admit date: 01/03/2016 Chief Complaint: Abnormal chest x-ray HPI: This is a 55 year old who was referred to me from the emergency department after being found to have abnormal chest x-ray in the emergency room. At that time he had chest pain and productive cough. CT of the chest at that time showed complete right middle lobe collapse with possible occlusion of the right middle lobe bronchus. Bronchoscopy is being planned for diagnostic purposes  Past Medical History:  Diagnosis Date  . Asthma   . Hypertension    History reviewed. No pertinent surgical history.      History reviewed. No pertinent family history. Apparently no family history of lung disease Social History:  reports that he has been smoking Cigarettes.  He has been smoking about 1.00 pack per day. He has never used smokeless tobacco. He reports that he drinks about 7.2 oz of alcohol per week . He reports that he does not use drugs.   Allergies: No Known Allergies  Medications Prior to Admission  Medication Sig Dispense Refill  . levofloxacin (LEVAQUIN) 750 MG tablet Take 1 tablet (750 mg total) by mouth daily. 10 tablet 0       [id number] from the symptoms mentioned above,there are no other symptoms referable to all systems reviewed.  Physical Exam: Blood pressure (!) 166/73, pulse 86, temperature 97.4 F (36.3 C), temperature source Oral, resp. rate (!) 23, height 5\' 1"  (1.549 m), weight 50.3 kg (111 lb), SpO2 99 %. He is awake and alert. Pupils are reactive. He has multiple missing teeth. His neck is supple. His chest shows some rhonchi. Heart is regular without gallop. Abdomen is soft no masses are felt extremities showed no edema. Central nervous system exam is grossly intact   No results for input(s): WBC, NEUTROABS, HGB, HCT, MCV, PLT in the last 72 hours. No results for input(s): NA, K, CL, CO2, GLUCOSE, BUN,  CREATININE, CALCIUM, MG in the last 72 hours.  Invalid input(s): PHOlablast2(ast:2,ALT:2,alkphos:2,bilitot:2,prot:2,albumin:2)@    No results found for this or any previous visit (from the past 240 hour(s)).   Dg Chest 2 View  Result Date: 12/26/2015 CLINICAL DATA:  Chest pain. EXAM: CHEST  2 VIEW COMPARISON:  05/02/2014. FINDINGS: Mediastinum hilar structures are normal. Elliptical density is noted over the right moderate fissure. 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 Right lower lobe atelectasis and/or infiltrate. Heart size normal. Thoracic spine scoliosis. IMPRESSION: 1. Prominent elliptical density noted over the right minor fissure. 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 2. Right lower lobe atelectasis and or infiltrate. Follow-up chest x-rays recommended to demonstrate clearing of these findings. Electronically Signed   By: Marcello Moores  Register   On: 12/26/2015 09:18  Ct Angio Chest Pe W Or Wo Contrast  Result Date: 12/26/2015 CLINICAL DATA:  Left anterior side chest pain with left shoulder pain for 2 months. EXAM: CT ANGIOGRAPHY CHEST WITH CONTRAST TECHNIQUE: Multidetector CT imaging of the chest was performed using the standard protocol during bolus administration of intravenous contrast. Multiplanar CT image reconstructions and MIPs were obtained to evaluate the vascular anatomy. CONTRAST:  100 cc Isovue 370 down COMPARISON:  None. FINDINGS: Cardiovascular: Heart size normal. No pericardial effusion. No thoracic aortic dissection. No filling defects in the opacified pulmonary arteries to suggest the presence of an acute pulmonary embolus. Mediastinum/Nodes: 14 mm short axis subcarinal lymph node evident. Otherwise no mediastinal lymphadenopathy. No left hilar lymphadenopathy. 17 mm short axis right hilar  lymph node identified. Abnormal soft tissue attenuation identified in the inferior right hilum is confluent with the right basilar collapse/consolidation. Lungs/Pleura: Complete collapse  right middle lobe noted. Right middle lobe bronchus is obliterated centrally. Patchy airspace disease noted posterior right lung base. Nodular peripheral airspace opacity in the left lower lobe measures 2.4 cm. Upper Abdomen: Unremarkable. Musculoskeletal: Bone windows reveal no worrisome lytic or sclerotic osseous lesions. IMPRESSION: 1. Complete right middle lobe collapse with central occlusion of the right middle lobe bronchus. Although no discrete lesion is evident, central obstructing process is a concern. 2. Right hilar and subcarinal lymphadenopathy. 3. PET-CT may prove helpful to assess hypermetabolism has neoplastic process is a concern. 4. 2.4 cm irregular nodular opacity posterior left lung appears less nodular on the sagittal projections. This could represent metastatic disease or infectious process. This could also be further assessed at PET-CT imaging. 5. No CT evidence for acute pulmonary embolus. Electronically Signed   By: Misty Stanley M.D.   On: 12/26/2015 12:55  Impression: He has an abnormal chest CT with right middle lobe collapse. This could be infectious or could be malignant. Active Problems:   * No active hospital problems. *     Plan: Fiber-optic bronchoscopy for diagnostic purposes      Jesus Walker L   01/03/2016, 9:04 AM

## 2016-01-03 NOTE — Op Note (Signed)
Bronchoscopy Procedure Note CLARK DAMUTH OF:[id number] Jan 03, 1961  Procedure: Bronchoscopy Indications: Diagnostic evaluation of the airways and Obtain specimens for culture and/or other diagnostic studies  Procedure Details Consent: Risks of procedure as well as the alternatives and risks of each were explained to the (patient/caregiver).  Consent for procedure obtained. Time Out: Verified patient identification, verified procedure, site/side was marked, verified correct patient position, special equipment/implants available, medications/allergies/relevent history reviewed, required imaging and test results available.  Performed  In preparation for procedure, bronchoscope lubricated. Sedation: Benzodiazepines  Airway entered and the following bronchi were examined: RUL, RML, RLL, LUL and LLL.   Procedures performed: Brushings performed to right middle lobe. Washings from right middle lobe were made also. He did not have any endobronchial lesions in the right middle lobe bronchus was open Bronchoscope removed.    Evaluation Hemodynamic Status: BP stable throughout; O2 sats: transiently fell during during procedure Patient's Current Condition: stable Specimens:  Sent serosanguinous fluid Complications: No apparent complications Patient did tolerate procedure well.   Meredith Kilbride L 01/03/2016

## 2016-01-10 ENCOUNTER — Encounter (HOSPITAL_COMMUNITY): Payer: Self-pay | Admitting: Pulmonary Disease

## 2016-02-29 ENCOUNTER — Ambulatory Visit (HOSPITAL_COMMUNITY)
Admission: RE | Admit: 2016-02-29 | Discharge: 2016-02-29 | Disposition: A | Discharge status: Home / Self Care | Payer: Medicaid Other | Source: Ambulatory Visit | Attending: Internal Medicine | Admitting: Internal Medicine

## 2016-02-29 ENCOUNTER — Other Ambulatory Visit (HOSPITAL_COMMUNITY): Payer: Self-pay | Admitting: Internal Medicine

## 2016-02-29 ENCOUNTER — Other Ambulatory Visit: Payer: Self-pay

## 2016-02-29 DIAGNOSIS — R0789 Other chest pain: Secondary | ICD-10-CM

## 2016-03-03 ENCOUNTER — Other Ambulatory Visit (HOSPITAL_COMMUNITY): Payer: Self-pay | Admitting: Internal Medicine

## 2016-03-03 DIAGNOSIS — R9389 Abnormal findings on diagnostic imaging of other specified body structures: Secondary | ICD-10-CM

## 2016-03-03 DIAGNOSIS — J9819 Other pulmonary collapse: Secondary | ICD-10-CM

## 2016-03-07 ENCOUNTER — Ambulatory Visit (HOSPITAL_COMMUNITY)
Admission: RE | Admit: 2016-03-07 | Discharge: 2016-03-07 | Disposition: A | Discharge status: Home / Self Care | Payer: Medicaid Other | Source: Ambulatory Visit | Attending: Internal Medicine | Admitting: Internal Medicine

## 2016-03-07 DIAGNOSIS — R938 Abnormal findings on diagnostic imaging of other specified body structures: Secondary | ICD-10-CM | POA: Insufficient documentation

## 2016-03-07 DIAGNOSIS — J479 Bronchiectasis, uncomplicated: Secondary | ICD-10-CM | POA: Diagnosis not present

## 2016-03-07 DIAGNOSIS — J9819 Other pulmonary collapse: Secondary | ICD-10-CM | POA: Diagnosis present

## 2016-03-07 DIAGNOSIS — R9389 Abnormal findings on diagnostic imaging of other specified body structures: Secondary | ICD-10-CM

## 2016-05-21 ENCOUNTER — Emergency Department (HOSPITAL_COMMUNITY): Payer: Medicaid Other

## 2016-05-21 ENCOUNTER — Encounter (HOSPITAL_COMMUNITY): Payer: Self-pay | Admitting: Emergency Medicine

## 2016-05-21 ENCOUNTER — Inpatient Hospital Stay (HOSPITAL_COMMUNITY)
Admission: EM | Admit: 2016-05-21 | Discharge: 2016-05-28 | DRG: 641 | Disposition: A | Discharge status: Home / Self Care | Payer: Medicaid Other | Attending: Internal Medicine | Admitting: Internal Medicine

## 2016-05-21 DIAGNOSIS — K219 Gastro-esophageal reflux disease without esophagitis: Secondary | ICD-10-CM | POA: Diagnosis present

## 2016-05-21 DIAGNOSIS — Z79899 Other long term (current) drug therapy: Secondary | ICD-10-CM | POA: Diagnosis not present

## 2016-05-21 DIAGNOSIS — R262 Difficulty in walking, not elsewhere classified: Secondary | ICD-10-CM

## 2016-05-21 DIAGNOSIS — R2681 Unsteadiness on feet: Secondary | ICD-10-CM

## 2016-05-21 DIAGNOSIS — E440 Moderate protein-calorie malnutrition: Secondary | ICD-10-CM | POA: Insufficient documentation

## 2016-05-21 DIAGNOSIS — I10 Essential (primary) hypertension: Secondary | ICD-10-CM | POA: Diagnosis present

## 2016-05-21 DIAGNOSIS — R918 Other nonspecific abnormal finding of lung field: Secondary | ICD-10-CM | POA: Diagnosis present

## 2016-05-21 DIAGNOSIS — D696 Thrombocytopenia, unspecified: Secondary | ICD-10-CM | POA: Diagnosis present

## 2016-05-21 DIAGNOSIS — F10231 Alcohol dependence with withdrawal delirium: Secondary | ICD-10-CM | POA: Diagnosis present

## 2016-05-21 DIAGNOSIS — Z6822 Body mass index (BMI) 22.0-22.9, adult: Secondary | ICD-10-CM | POA: Diagnosis not present

## 2016-05-21 DIAGNOSIS — E512 Wernicke's encephalopathy: Secondary | ICD-10-CM | POA: Diagnosis present

## 2016-05-21 DIAGNOSIS — R4182 Altered mental status, unspecified: Secondary | ICD-10-CM

## 2016-05-21 DIAGNOSIS — Z23 Encounter for immunization: Secondary | ICD-10-CM

## 2016-05-21 DIAGNOSIS — G629 Polyneuropathy, unspecified: Secondary | ICD-10-CM | POA: Diagnosis present

## 2016-05-21 DIAGNOSIS — F17200 Nicotine dependence, unspecified, uncomplicated: Secondary | ICD-10-CM | POA: Diagnosis present

## 2016-05-21 DIAGNOSIS — R569 Unspecified convulsions: Secondary | ICD-10-CM

## 2016-05-21 DIAGNOSIS — E871 Hypo-osmolality and hyponatremia: Principal | ICD-10-CM | POA: Diagnosis present

## 2016-05-21 DIAGNOSIS — F1020 Alcohol dependence, uncomplicated: Secondary | ICD-10-CM | POA: Diagnosis present

## 2016-05-21 DIAGNOSIS — E441 Mild protein-calorie malnutrition: Secondary | ICD-10-CM | POA: Diagnosis present

## 2016-05-21 DIAGNOSIS — F17210 Nicotine dependence, cigarettes, uncomplicated: Secondary | ICD-10-CM | POA: Diagnosis present

## 2016-05-21 DIAGNOSIS — E876 Hypokalemia: Secondary | ICD-10-CM | POA: Diagnosis present

## 2016-05-21 DIAGNOSIS — F1010 Alcohol abuse, uncomplicated: Secondary | ICD-10-CM | POA: Diagnosis present

## 2016-05-21 DIAGNOSIS — M6281 Muscle weakness (generalized): Secondary | ICD-10-CM

## 2016-05-21 HISTORY — DX: Gastro-esophageal reflux disease without esophagitis: K21.9

## 2016-05-21 HISTORY — DX: Alcohol dependence, uncomplicated: F10.20

## 2016-05-21 LAB — CBC WITH DIFFERENTIAL/PLATELET
Basophils Absolute: 0 10*3/uL (ref 0.0–0.1)
Basophils Relative: 0 %
EOS PCT: 1 %
Eosinophils Absolute: 0 10*3/uL (ref 0.0–0.7)
HCT: 37.1 % — ABNORMAL LOW (ref 39.0–52.0)
Hemoglobin: 14 g/dL (ref 13.0–17.0)
LYMPHS ABS: 0.8 10*3/uL (ref 0.7–4.0)
Lymphocytes Relative: 20 %
MCH: 36.1 pg — AB (ref 26.0–34.0)
MCHC: 37.5 g/dL — AB (ref 30.0–36.0)
MCV: 95.6 fL (ref 78.0–100.0)
MONOS PCT: 10 %
Monocytes Absolute: 0.4 10*3/uL (ref 0.1–1.0)
Neutro Abs: 2.8 10*3/uL (ref 1.7–7.7)
Neutrophils Relative %: 70 %
Platelets: 144 10*3/uL — ABNORMAL LOW (ref 150–400)
RBC: 3.88 MIL/uL — AB (ref 4.22–5.81)
RDW: 12.7 % (ref 11.5–15.5)
WBC: 4 10*3/uL (ref 4.0–10.5)

## 2016-05-21 LAB — MAGNESIUM: Magnesium: 1.4 mg/dL — ABNORMAL LOW (ref 1.7–2.4)

## 2016-05-21 LAB — URINALYSIS, ROUTINE W REFLEX MICROSCOPIC
Bilirubin Urine: NEGATIVE
Glucose, UA: NEGATIVE mg/dL
HGB URINE DIPSTICK: NEGATIVE
Ketones, ur: NEGATIVE mg/dL
Leukocytes, UA: NEGATIVE
Nitrite: NEGATIVE
Protein, ur: NEGATIVE mg/dL
pH: 6 (ref 5.0–8.0)

## 2016-05-21 LAB — COMPREHENSIVE METABOLIC PANEL
ALBUMIN: 3.2 g/dL — AB (ref 3.5–5.0)
ALT: 32 U/L (ref 17–63)
ANION GAP: 11 (ref 5–15)
AST: 68 U/L — ABNORMAL HIGH (ref 15–41)
Alkaline Phosphatase: 78 U/L (ref 38–126)
BILIRUBIN TOTAL: 1.7 mg/dL — AB (ref 0.3–1.2)
BUN: 8 mg/dL (ref 6–20)
CO2: 32 mmol/L (ref 22–32)
Calcium: 8.7 mg/dL — ABNORMAL LOW (ref 8.9–10.3)
Chloride: 72 mmol/L — ABNORMAL LOW (ref 101–111)
Creatinine, Ser: 0.59 mg/dL — ABNORMAL LOW (ref 0.61–1.24)
GFR calc Af Amer: >60 mL/min (ref >60)
GFR calc non Af Amer: >60 mL/min (ref >60)
GLUCOSE: 88 mg/dL (ref 65–99)
POTASSIUM: 3.2 mmol/L — AB (ref 3.5–5.1)
SODIUM: 115 mmol/L — AB (ref 135–145)
TOTAL PROTEIN: 7.3 g/dL (ref 6.5–8.1)

## 2016-05-21 LAB — BASIC METABOLIC PANEL
ANION GAP: 10 (ref 5–15)
ANION GAP: 10 (ref 5–15)
BUN: 6 mg/dL (ref 6–20)
BUN: 7 mg/dL (ref 6–20)
CALCIUM: 8.4 mg/dL — AB (ref 8.9–10.3)
CO2: 27 mmol/L (ref 22–32)
CO2: 28 mmol/L (ref 22–32)
Calcium: 8.5 mg/dL — ABNORMAL LOW (ref 8.9–10.3)
Chloride: 82 mmol/L — ABNORMAL LOW (ref 101–111)
Chloride: 83 mmol/L — ABNORMAL LOW (ref 101–111)
Creatinine, Ser: 0.56 mg/dL — ABNORMAL LOW (ref 0.61–1.24)
Creatinine, Ser: 0.65 mg/dL (ref 0.61–1.24)
GFR calc Af Amer: >60 mL/min (ref >60)
GFR calc Af Amer: >60 mL/min (ref >60)
GFR calc non Af Amer: >60 mL/min (ref >60)
GFR calc non Af Amer: >60 mL/min (ref >60)
GLUCOSE: 91 mg/dL (ref 65–99)
GLUCOSE: 86 mg/dL (ref 65–99)
POTASSIUM: 3.2 mmol/L — AB (ref 3.5–5.1)
POTASSIUM: 3.5 mmol/L (ref 3.5–5.1)
Sodium: 119 mmol/L — CL (ref 135–145)
Sodium: 121 mmol/L — ABNORMAL LOW (ref 135–145)

## 2016-05-21 LAB — RAPID URINE DRUG SCREEN, HOSP PERFORMED
AMPHETAMINES: NOT DETECTED
AMPHETAMINES: NOT DETECTED
BARBITURATES: NOT DETECTED
BARBITURATES: NOT DETECTED
BENZODIAZEPINES: NOT DETECTED
Benzodiazepines: NOT DETECTED
Cocaine: NOT DETECTED
Cocaine: NOT DETECTED
OPIATES: NOT DETECTED
Opiates: NOT DETECTED
TETRAHYDROCANNABINOL: NOT DETECTED
TETRAHYDROCANNABINOL: NOT DETECTED

## 2016-05-21 LAB — PHOSPHORUS: Phosphorus: 2.2 mg/dL — ABNORMAL LOW (ref 2.5–4.6)

## 2016-05-21 LAB — PROTIME-INR
INR: 0.91
INR: 0.95
PROTHROMBIN TIME: 12.2 s (ref 11.4–15.2)
Prothrombin Time: 12.6 seconds (ref 11.4–15.2)

## 2016-05-21 LAB — ETHANOL: Alcohol, Ethyl (B): 45 mg/dL — ABNORMAL HIGH (ref <5)

## 2016-05-21 LAB — LIPASE, BLOOD: Lipase: 20 U/L (ref 11–51)

## 2016-05-21 LAB — AMMONIA: Ammonia: <9 umol/L — ABNORMAL LOW (ref 9–35)

## 2016-05-21 MED ORDER — LORAZEPAM 2 MG/ML IJ SOLN
1.0000 mg | INTRAMUSCULAR | Status: DC | PRN
  Administered 2016-05-26 – 2016-05-28 (×7): 2 mg via INTRAVENOUS
  Filled 2016-05-21 (×8): qty 1

## 2016-05-21 MED ORDER — SODIUM CHLORIDE 0.9 % IV SOLN
INTRAVENOUS | Status: DC
  Administered 2016-05-22 – 2016-05-24 (×5): via INTRAVENOUS

## 2016-05-21 MED ORDER — THIAMINE HCL 100 MG/ML IJ SOLN
100.0000 mg | Freq: Every day | INTRAMUSCULAR | Status: DC
  Administered 2016-05-22 – 2016-05-28 (×7): 100 mg via INTRAVENOUS
  Filled 2016-05-21 (×7): qty 2

## 2016-05-21 MED ORDER — ONDANSETRON HCL 4 MG PO TABS: 4.0000 mg | ORAL_TABLET | Freq: Four times a day (QID) | ORAL | Status: DC | PRN

## 2016-05-21 MED ORDER — SODIUM CHLORIDE 0.9 % IV BOLUS (SEPSIS)
250.0000 mL | Freq: Once | INTRAVENOUS | Status: AC
  Administered 2016-05-21: 250 mL via INTRAVENOUS

## 2016-05-21 MED ORDER — ACETAMINOPHEN 650 MG RE SUPP
650.0000 mg | RECTAL | Status: DC | PRN
  Administered 2016-05-26: 650 mg via RECTAL
  Filled 2016-05-21: qty 1

## 2016-05-21 MED ORDER — SODIUM CHLORIDE 0.9 % IV SOLN: INTRAVENOUS | Status: DC

## 2016-05-21 MED ORDER — PANTOPRAZOLE SODIUM 40 MG PO TBEC
40.0000 mg | DELAYED_RELEASE_TABLET | Freq: Every day | ORAL | Status: DC
  Administered 2016-05-22 – 2016-05-28 (×7): 40 mg via ORAL
  Filled 2016-05-21 (×7): qty 1

## 2016-05-21 MED ORDER — ONDANSETRON HCL 4 MG/2ML IJ SOLN
4.0000 mg | Freq: Four times a day (QID) | INTRAMUSCULAR | Status: DC | PRN
  Administered 2016-05-25: 4 mg via INTRAVENOUS
  Filled 2016-05-21: qty 2

## 2016-05-21 MED ORDER — SODIUM CHLORIDE 0.9 % IV SOLN
75.0000 mL/h | INTRAVENOUS | Status: DC
  Administered 2016-05-21: 75 mL/h via INTRAVENOUS

## 2016-05-21 MED ORDER — ENOXAPARIN SODIUM 40 MG/0.4ML ~~LOC~~ SOLN
40.0000 mg | SUBCUTANEOUS | Status: DC
  Administered 2016-05-21 – 2016-05-27 (×7): 40 mg via SUBCUTANEOUS
  Filled 2016-05-21 (×7): qty 0.4

## 2016-05-21 MED ORDER — SODIUM CHLORIDE 0.9 % IV SOLN
INTRAVENOUS | Status: DC
  Administered 2016-05-21: 13:00:00 via INTRAVENOUS

## 2016-05-21 MED ORDER — ACETAMINOPHEN 325 MG PO TABS: 650.0000 mg | ORAL_TABLET | ORAL | Status: DC | PRN

## 2016-05-21 MED ORDER — AMLODIPINE BESYLATE 5 MG PO TABS
5.0000 mg | ORAL_TABLET | Freq: Every day | ORAL | Status: DC
  Administered 2016-05-22 – 2016-05-28 (×7): 5 mg via ORAL
  Filled 2016-05-21 (×7): qty 1

## 2016-05-21 MED ORDER — FOLIC ACID 5 MG/ML IJ SOLN
1.0000 mg | Freq: Every day | INTRAMUSCULAR | Status: DC
  Administered 2016-05-22: 1 mg via INTRAVENOUS
  Filled 2016-05-21 (×5): qty 0.2

## 2016-05-21 MED ORDER — LORAZEPAM 2 MG/ML IJ SOLN
2.0000 mg | INTRAMUSCULAR | Status: DC | PRN
  Administered 2016-05-23: 3 mg via INTRAVENOUS
  Administered 2016-05-24 (×2): 2 mg via INTRAVENOUS
  Administered 2016-05-24: 3 mg via INTRAVENOUS
  Administered 2016-05-24: 2 mg via INTRAVENOUS
  Administered 2016-05-24 – 2016-05-25 (×2): 3 mg via INTRAVENOUS
  Administered 2016-05-25: 2 mg via INTRAVENOUS
  Administered 2016-05-25: 3 mg via INTRAVENOUS
  Administered 2016-05-25 – 2016-05-26 (×2): 2 mg via INTRAVENOUS
  Administered 2016-05-26: 3 mg via INTRAVENOUS
  Administered 2016-05-26 – 2016-05-27 (×5): 2 mg via INTRAVENOUS
  Filled 2016-05-21: qty 2
  Filled 2016-05-21 (×4): qty 1
  Filled 2016-05-21 (×2): qty 2
  Filled 2016-05-21: qty 1
  Filled 2016-05-21: qty 2
  Filled 2016-05-21 (×2): qty 1
  Filled 2016-05-21: qty 2
  Filled 2016-05-21: qty 1
  Filled 2016-05-21: qty 2
  Filled 2016-05-21 (×2): qty 1
  Filled 2016-05-21: qty 2
  Filled 2016-05-21 (×3): qty 1

## 2016-05-21 MED ORDER — POTASSIUM CHLORIDE CRYS ER 20 MEQ PO TBCR
40.0000 meq | EXTENDED_RELEASE_TABLET | Freq: Once | ORAL | Status: AC
  Administered 2016-05-22: 40 meq via ORAL
  Filled 2016-05-21: qty 2

## 2016-05-21 NOTE — ED Notes (Signed)
CRITICAL VALUE ALERT  Critical value received:  Sodium 115  Date of notification:  05/21/2016  Time of notification:  1226  Critical value read back:  Yes  Nurse who received alert:  Cena Benton  MD notified (1st page):  Dr. Rogene Houston  Time of first page:  1228  Time MD responded:  1228

## 2016-05-21 NOTE — ED Triage Notes (Signed)
Per EMS no seizure and family states that he has no seizures.  Family states that he was hallucinating on yesterday seeing people walking in front of him.  His complaint today is blood in urine and dizziness. Pt had a collapsed lung on left side pt states that happened a month ago and was hospitalized.  Pt has hx of ETOH, but denies having alcohol today.  Pt states that he hasn't taken his amlodipine or lisinopril since day before yesterday.  O2 91 cbg 113

## 2016-05-21 NOTE — H&P (Signed)
History and Physical    Jesus Walker [id number] DOB: 1960-06-19 DOA: 05/21/2016  PCP: Rosita Fire, MD Consultants:  Luan Pulling - pulmonology Patient coming from: home - lives with girlfriend; NOK: girlfriend, [phone]  Chief Complaint: seizures  HPI: Jesus Walker is a 55 y.o. male with medical history significant of HTN, GERD, asthma, and ETOH dependence presenting with seizures.  He reports that he has been having a headache and feeling dizzy.  Occasional in the past, but worse now than usual. His wife reports that he has been having audiovisual hallucinations since Monday night.  Has had 3 seizures since then.  Last drink was yesterday. Usually drinks 12 beers per day plus wine, 1/5 daily. Drank as usual Sunday and Monday but still had hallucinations/seizures.  Last time he DID NOT drink for 2 days was in December of last year.  No h/o seizures in the past.  Has also vomited a little bit.  Has never been to ETOH rehab in the past.  Prescribed Amoxicllin TID for 7 days on 12/8 - but this was a refilled bottle and it is mostly full so it is not clear how much he took.  Pharmacy notes that he has not started taking it.  It is not clear what this is for.   ED Course: Per Dr. Rogene Houston: Patient with history of altered mental status for the past 2 days. History of significant alcohol intake. Patient reportedly had a seizure yesterday and questionable one today no known history of seizures.  Workup here shows market hyponatremia. Patient now with IV fluids more awake and alert. Head CT raised concern for an acute stroke. MRI of the brain negative for stroke. Patient will require admission for IV hydration with normal saline to correct the hyponatremia. Patient's sodium was 115.   Review of Systems: As per HPI; otherwise 10 point review of systems reviewed and negative.   Ambulatory Status:  Occasionally ambulates with cane/walker  Past Medical History:  Diagnosis Date  . Alcohol  dependence ([place])   . Asthma   . GERD (gastroesophageal reflux disease)   . Hypertension     Past Surgical History:  Procedure Laterality Date  . BRONCHIAL BRUSHINGS  01/03/2016   Procedure: BRONCHIAL BRUSHINGS;  Surgeon: Sinda Du, MD;  Location: [facility];  Service: Cardiopulmonary;;  . BRONCHIAL WASHINGS  01/03/2016   Procedure: BRONCHIAL WASHINGS;  Surgeon: Sinda Du, MD;  Location: [facility];  Service: Cardiopulmonary;;  . FLEXIBLE BRONCHOSCOPY N/A 01/03/2016   Procedure: FLEXIBLE BRONCHOSCOPY;  Surgeon: Sinda Du, MD;  Location: [facility];  Service: Cardiopulmonary;  Laterality: N/A;    Social History   Social History  . Marital status: Single    Spouse name: N/A  . Number of children: N/A  . Years of education: N/A   Occupational History  . unemployed    Social History Main Topics  . Smoking status: Current Every Day Smoker    Packs/day: 1.00    Types: Cigarettes  . Smokeless tobacco: Never Used  . Alcohol use 7.2 oz/week    12 Cans of beer per week     Comment: drinks 12 pack + 1/5 wine daily  . Drug use: No  . Sexual activity: Not on file   Other Topics Concern  . Not on file   Social History Narrative   Caught driving without a license, which he lost for DWI.  Does not drive.    No Known Allergies  Family History  Problem Relation Age of  Onset  . Asthma Mother 30  . Asthma Father 93    Prior to Admission medications   Medication Sig Start Date End Date Taking? Authorizing Provider  amLODipine (NORVASC) 5 MG tablet Take 5 mg by mouth daily.   Yes Historical Provider, MD  amoxicillin (AMOXIL) 500 MG tablet Take 500 mg by mouth 3 (three) times daily.   Yes Historical Provider, MD  lisinopril-hydrochlorothiazide (PRINZIDE,ZESTORETIC) 20-12.5 MG tablet Take 1 tablet by mouth daily.   Yes Historical Provider, MD  omeprazole (PRILOSEC) 20 MG capsule Take 20 mg by mouth daily.   Yes Historical Provider, MD    Physical Exam: Vitals:     05/21/16 2100 05/21/16 2200 05/21/16 2230 05/21/16 2300  BP: 114/66 127/66 121/65 118/66  Pulse: 98 99 99 98  Resp:   23 22  Temp:      TempSrc:      SpO2: 99% 99% 97% 100%  Weight:      Height:         General:  Appears somnolent and a little confused but is NAD; he is able to answer questions appropriately Eyes:  PERRL, EOMI, normal lids, iris ENT:  grossly normal hearing, lips & tongue, mmm; very poor dentition Neck:  no LAD, masses or thyromegaly Cardiovascular:  RRR, no m/r/g. No LE edema.  Respiratory:  CTA bilaterally, no w/r/r. Normal respiratory effort. Abdomen:  soft, ntnd, NABS Skin:  no rash or induration seen on limited exam Musculoskeletal:  grossly normal tone BUE/BLE, good ROM, no bony abnormality Psychiatric:  grossly normal mood and affect, speech fluent and appropriate, AOx3; slight tremor and restlessness Neurologic:  CN 2-12 grossly intact, moves all extremities in coordinated fashion, sensation intact  Labs on Admission: I have personally reviewed following labs and imaging studies  CBC:  Recent Labs Lab 05/21/16 1145  WBC 4.0  NEUTROABS 2.8  HGB 14.0  HCT 37.1*  MCV 95.6  PLT 123456*   Basic Metabolic Panel:  Recent Labs Lab 05/21/16 1145 05/21/16 1755 05/21/16 2042 05/21/16 2043  NA 115* 119*  --  121*  K 3.2* 3.2*  --  3.5  CL 72* 82*  --  83*  CO2 32 27  --  28  GLUCOSE 88 91  --  86  BUN 8 6  --  7  CREATININE 0.59* 0.56*  --  0.65  CALCIUM 8.7* 8.4*  --  8.5*  MG  --   --  1.4*  --   PHOS  --   --  2.2*  --    GFR: Estimated Creatinine Clearance: 75.4 mL/min (by C-G formula based on SCr of 0.65 mg/dL). Liver Function Tests:  Recent Labs Lab 05/21/16 1145  AST 68*  ALT 32  ALKPHOS 78  BILITOT 1.7*  PROT 7.3  ALBUMIN 3.2*    Recent Labs Lab 05/21/16 1145  LIPASE 20    Recent Labs Lab 05/21/16 1145  AMMONIA <9*   Coagulation Profile:  Recent Labs Lab 05/21/16 1145 05/21/16 2042  INR 0.91 0.95   Cardiac  Enzymes: No results for input(s): CKTOTAL, CKMB, CKMBINDEX, TROPONINI in the last 168 hours. BNP (last 3 results) No results for input(s): PROBNP in the last 8760 hours. HbA1C: No results for input(s): HGBA1C in the last 72 hours. CBG: No results for input(s): GLUCAP in the last 168 hours. Lipid Profile: No results for input(s): CHOL, HDL, LDLCALC, TRIG, CHOLHDL, LDLDIRECT in the last 72 hours. Thyroid Function Tests: No results for input(s): TSH, T4TOTAL, FREET4, T3FREE,  THYROIDAB in the last 72 hours. Anemia Panel: No results for input(s): VITAMINB12, FOLATE, FERRITIN, TIBC, IRON, RETICCTPCT in the last 72 hours. Urine analysis:    Component Value Date/Time   COLORURINE AMBER (A) 05/21/2016 1145   APPEARANCEUR CLEAR 05/21/2016 1145   LABSPEC <1.005 (L) 05/21/2016 1145   PHURINE 6.0 05/21/2016 1145   GLUCOSEU NEGATIVE 05/21/2016 1145   HGBUR NEGATIVE 05/21/2016 1145   BILIRUBINUR NEGATIVE 05/21/2016 1145   KETONESUR NEGATIVE 05/21/2016 1145   PROTEINUR NEGATIVE 05/21/2016 1145   NITRITE NEGATIVE 05/21/2016 1145   LEUKOCYTESUR NEGATIVE 05/21/2016 1145    Creatinine Clearance: Estimated Creatinine Clearance: 75.4 mL/min (by C-G formula based on SCr of 0.65 mg/dL).  Sepsis Labs: @LABRCNTIP (procalcitonin:4,lacticidven:4) )No results found for this or any previous visit (from the past 240 hour(s)).   Radiological Exams on Admission: Dg Chest 2 View  Result Date: 05/21/2016 CLINICAL DATA:  Altered mental status EXAM: CHEST  2 VIEW COMPARISON:  02/29/2016 FINDINGS: Right middle lobe collapse unchanged. Elevated right hemidiaphragm unchanged. Left lung remains clear. Heart size and vascularity normal. Pulmonary hyperinflation. IMPRESSION: Right middle lobe collapse unchanged. Electronically Signed   By: Franchot Gallo M.D.   On: 05/21/2016 13:43   Ct Head Wo Contrast  Result Date: 05/21/2016 CLINICAL DATA:  Mild vascular calcification noted.  There are foci EXAM: CT HEAD  WITHOUT CONTRAST TECHNIQUE: Contiguous axial images were obtained from the base of the skull through the vertex without intravenous contrast. COMPARISON:  None. FINDINGS: Brain: There is mild diffuse atrophy. There is no intracranial mass, hemorrhage, extra-axial fluid collection, or midline shift. There is decreased attenuation in the inferior right basal ganglia region involving the inferior aspect of the right globus pallidum and putamen on the right as well as portions of the claustrum and extreme capsule on the right. This finding is best appreciated on axial slice 14 series 2. No other findings concerning for potential acute infarct elsewhere noted. Vascular: There is no hyperdense vessel. There is calcification in both carotid siphon regions. Skull: Bones are osteoporotic. No focal bone lesions are evident in the calvarium. Sinuses/Orbits: There is mucosal thickening in several ethmoid air cell regions bilaterally. Visualized paranasal sinuses elsewhere clear. There is leftward deviation of the nasal septum. Orbits appear symmetric bilaterally. Other: Mastoid air cells are clear. IMPRESSION: Decreased attenuation in the inferior somewhat posterior aspects of the right basal is suspicious for acute infarction in this area. Elsewhere there is mild atrophy with patchy periventricular small vessel disease. No hemorrhage. No well-defined mass or extra-axial fluid collection. Foci of arterial vascular calcification noted. Ethmoid sinus disease noted bilaterally. Leftward deviation of nasal septum. Electronically Signed   By: Lowella Grip III M.D.   On: 05/21/2016 13:36   Mr Brain Wo Contrast (neuro Protocol)  Result Date: 05/21/2016 CLINICAL DATA:  Altered mental status.  Rule out CVA EXAM: MRI HEAD WITHOUT CONTRAST TECHNIQUE: Multiplanar, multiecho pulse sequences of the brain and surrounding structures were obtained without intravenous contrast. COMPARISON:  CT head 05/21/2016 FINDINGS: Brain: Image  quality degraded by mild motion. Negative for acute infarct. Mild chronic microvascular ischemic change in the white matter. Mild cerebral atrophy. Negative for hydrocephalus. Negative for hemorrhage or mass. No shift of the midline structures. Vascular: Normal arterial flow voids. Skull and upper cervical spine: Negative Sinuses/Orbits: Mucosal edema paranasal sinuses.  Normal orbit. Other: None IMPRESSION: Atrophy and chronic microvascular ischemic change. No acute abnormality. Negative for acute infarct. Electronically Signed   By: Franchot Gallo M.D.   On: 05/21/2016  15:17    EKG: Independently reviewed.  NSR with rate 88; nonspecific ST changes with no evidence of acute ischemia  Assessment/Plan Principal Problem:   Hyponatremia Active Problems:   Lung mass   Hypokalemia   Mild malnutrition (HCC)   Thrombocytopenia (HCC)   Seizure (HCC)   Alcohol dependence (HCC)   Tobacco dependence   Severe hyponatremia -Etiology appears to be hypovolemic hyponatremia in the setting of chronic alcoholism -Suspect beer potomania with low daily solute intake and excessive ETOH ingestion -Will check urinary and serum Osm as well as urinary sodium and potassium levels -While his seizures certainly could be attributed to this issue (see below), there are also other considerations -His mental status was generally intact at the time of my evaluation despite Na 115 -Dr. Rogene Houston felt that he was not continuing to seize and that his mental status was appropriate enough that he did not need hypertonic saline (hot salts) for rapid correction -However, he did need NS infused at a rate that would decrease his risk for ongoing seizures -This rate has been adjusted to currently 25 cc/hr in an attempt to avoid overcorrection (>63mEq/L/day) so as to avoid central pontine myelinolysis -Will monitor sodium q4h x 5   Seizures -This may be solely attributed to his hyponatremia -However, it is concerning that the  patient has also been having audiovisual hallucinations simultaneously -It raises concern that his seizures are at least partly due to ETOH withdrawal - despite ongoing ETOH ingestion -Will place on seizure precautions -Will need driving restrictions - although this is moot because the patient does not have a license -Ativan prn seizures -If ongoing seizures, would suggest neurology consultation  ETOH dependence -This may be the source of the aforementioned problems and the most serious problem of all -The patient is uncertain if he even desires to quit drinking -Unfortunately, given his current situation, he is almost certainly going to have to stop drinking during the hospitalization at a minimum -He is at extremely high-risk for serious DTs -Will place in step-down unit for now on CIWA protocol -Given his high risk of DTs, if his symptoms are not controlled with Ativan he may require a Precedex infusion -I did call and discuss the patient with Dr. Luan Pulling, who is comfortable with the patient staying here at [facility]  Hypokalemia -Mild -Replete and follow  Mild malnutrition -Albumin 3.2 -Nutrition consult  Thrombocytopenia -Plt 144 -This is new and may be indicative of progressive hepatic failure resulting from alcohol dependence -Will follow -If platelets fall <100, will need to hold Lovenox -Will check coags  H/o possible lung mass -Bronchial brushings found no malignant cells -Consider repeat chest CT   DVT prophylaxis: Lovenox Code Status: Full - confirmed with patient/family Family Communication: Girlfriend present throughout evaluation Disposition Plan:  Home once clinically improved Consults called: None Admission status: Admit - It is my clinical opinion that admission to INPATIENT is reasonable and necessary because this patient will require at least 2 midnights in the hospital to treat this condition based on the medical complexity of the problems presented.  Given the  aforementioned information, the predictability of an adverse outcome is felt to be significant.    Karmen Bongo MD [facility]  If 7PM-7AM, please contact night-coverage [email or web address] Password [facility]  05/21/2016, 11:48 PM

## 2016-05-21 NOTE — ED Provider Notes (Addendum)
[facility] DEPT Provider Note   CSN: [id number] Arrival date & time: 05/21/16  1029  By signing my name below, I, Rayna Sexton, attest that this documentation has been prepared under the direction and in the presence of Fredia Sorrow, MD. Electronically Signed: Rayna Sexton, ED Scribe. 05/21/16. 11:22 AM.   History   Chief Complaint Chief Complaint  Patient presents with  . Dizziness    blood in urine    LEVEL FIVE CAVEAT: AMS  HPI HPI Comments: Jesus Walker is a 55 y.o. male who presents to the Emergency Department due to [place]. His relative states "he was seeing stuff yesterday and taking off his clothes". Today she states he has been ambulatory "but isn't walking as much". They report he is experiencing a fever (98.5 F in triage), "had a seizure last night", productive cough and decreased appetite. His family states he consumes ETOH daily with his last drink being ~11 hours ago. His family informed nursing staff that he has not taken his amlodipine or lisinopril for two days. He denies CP and hallucinations.    The history is provided by a relative, medical records and the patient. The history is limited by the condition of the patient. No language interpreter was used.    Past Medical History:  Diagnosis Date  . Asthma   . Hypertension     Patient Active Problem List   Diagnosis Date Noted  . Chest pain 12/26/2015  . 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 CAP (community acquired pneumonia) 12/26/2015    Past Surgical History:  Procedure Laterality Date  . BRONCHIAL BRUSHINGS  01/03/2016   Procedure: BRONCHIAL BRUSHINGS;  Surgeon: Sinda Du, MD;  Location: [facility];  Service: Cardiopulmonary;;  . BRONCHIAL WASHINGS  01/03/2016   Procedure: BRONCHIAL WASHINGS;  Surgeon: Sinda Du, MD;  Location: [facility];  Service: Cardiopulmonary;;  . FLEXIBLE BRONCHOSCOPY N/A 01/03/2016   Procedure: FLEXIBLE BRONCHOSCOPY;  Surgeon: Sinda Du, MD;  Location: [facility];  Service: Cardiopulmonary;  Laterality: N/A;       Home Medications    Prior to Admission medications   Medication Sig Start Date End Date Taking? Authorizing Provider  amLODipine (NORVASC) 5 MG tablet Take 5 mg by mouth daily.   Yes Historical Provider, MD  amoxicillin (AMOXIL) 500 MG tablet Take 500 mg by mouth 3 (three) times daily.   Yes Historical Provider, MD  lisinopril-hydrochlorothiazide (PRINZIDE,ZESTORETIC) 20-12.5 MG tablet Take 1 tablet by mouth daily.   Yes Historical Provider, MD  omeprazole (PRILOSEC) 20 MG capsule Take 20 mg by mouth daily.   Yes Historical Provider, MD    Family History No family history on file.  Social History Social History  Substance Use Topics  . Smoking status: Current Every Day Smoker    Packs/day: 1.00    Types: Cigarettes  . Smokeless tobacco: Never Used  . Alcohol use 7.2 oz/week    12 Cans of beer per week     Allergies   Patient has no known allergies.   Review of Systems Review of Systems  Unable to perform ROS: Mental status change   Physical Exam Updated Vital Signs BP 131/70   Pulse 89   Temp 97.8 F (36.6 C)   Resp 22   Ht 5\' 1"  (1.549 m)   Wt 53.1 kg   SpO2 94%   BMI 22.11 kg/m   Physical Exam  Constitutional: He appears well-developed.  HENT:  Head: Normocephalic and atraumatic.  Mouth/Throat: Oropharynx is clear and  moist. Mucous membranes are dry.  Eyes: EOM are normal. Pupils are equal, round, and reactive to light. Right conjunctiva is injected. Left conjunctiva is injected. No scleral icterus.  Neck: Normal range of motion. Neck supple. No tracheal deviation present.  Cardiovascular: Normal rate, regular rhythm, normal heart sounds and intact distal pulses.  Exam reveals no gallop and no friction rub.   No murmur heard. Pulmonary/Chest: Effort normal and breath sounds normal. No respiratory distress. He has no wheezes. He has no rales.  Abdominal: Soft. Bowel sounds are normal. There is no  tenderness.  Abdomen is flat but firm.   Musculoskeletal: Normal range of motion. He exhibits no edema.  Neurological: He is alert. No cranial nerve deficit. He exhibits normal muscle tone. Coordination normal.  Moves all extremities w/o difficulty.   Skin: Skin is warm and dry.  Nursing note and vitals reviewed.   ED Treatments / Results  Labs (all labs ordered are listed, but only abnormal results are displayed) Labs Reviewed  COMPREHENSIVE METABOLIC PANEL - Abnormal; Notable for the following:       Result Value   Sodium 115 (*)    Potassium 3.2 (*)    Chloride 72 (*)    Creatinine, Ser 0.59 (*)    Calcium 8.7 (*)    Albumin 3.2 (*)    AST 68 (*)    Total Bilirubin 1.7 (*)    All other components within normal limits  CBC WITH DIFFERENTIAL/PLATELET - Abnormal; Notable for the following:    RBC 3.88 (*)    HCT 37.1 (*)    MCH 36.1 (*)    MCHC 37.5 (*)    Platelets 144 (*)    All other components within normal limits  ETHANOL - Abnormal; Notable for the following:    Alcohol, Ethyl (B) 45 (*)    All other components within normal limits  AMMONIA - Abnormal; Notable for the following:    Ammonia <9 (*)    All other components within normal limits  LIPASE, BLOOD  RAPID URINE DRUG SCREEN, HOSP PERFORMED  PROTIME-INR  URINALYSIS, ROUTINE W REFLEX MICROSCOPIC    EKG  EKG Interpretation  Date/Time:  Wednesday May 21 2016 10:37:59 EST Ventricular Rate:  88 PR Interval:    QRS Duration: 99 QT Interval:  425 QTC Calculation: 515 R Axis:   83 Text Interpretation:  Sinus rhythm Short PR interval Nonspecific T abnormalities, lateral leads Minimal ST elevation, inferior leads Prolonged QT interval No significant change since last tracing Confirmed by Amariya Liskey  MD, Mieko Kneebone [phone]) on 05/21/2016 11:25:37 AM       Radiology Dg Chest 2 View  Result Date: 05/21/2016 CLINICAL DATA:  Altered mental status EXAM: CHEST  2 VIEW COMPARISON:  02/29/2016 FINDINGS: Right  middle lobe collapse unchanged. Elevated right hemidiaphragm unchanged. Left lung remains clear. Heart size and vascularity normal. Pulmonary hyperinflation. IMPRESSION: Right middle lobe collapse unchanged. Electronically Signed   By: Franchot Gallo M.D.   On: 05/21/2016 13:43   Ct Head Wo Contrast  Result Date: 05/21/2016 CLINICAL DATA:  Mild vascular calcification noted.  There are foci EXAM: CT HEAD WITHOUT CONTRAST TECHNIQUE: Contiguous axial images were obtained from the base of the skull through the vertex without intravenous contrast. COMPARISON:  None. FINDINGS: Brain: There is mild diffuse atrophy. There is no intracranial mass, hemorrhage, extra-axial fluid collection, or midline shift. There is decreased attenuation in the inferior right basal ganglia region involving the inferior aspect of the right globus pallidum and putamen  on the right as well as portions of the claustrum and extreme capsule on the right. This finding is best appreciated on axial slice 14 series 2. No other findings concerning for potential acute infarct elsewhere noted. Vascular: There is no hyperdense vessel. There is calcification in both carotid siphon regions. Skull: Bones are osteoporotic. No focal bone lesions are evident in the calvarium. Sinuses/Orbits: There is mucosal thickening in several ethmoid air cell regions bilaterally. Visualized paranasal sinuses elsewhere clear. There is leftward deviation of the nasal septum. Orbits appear symmetric bilaterally. Other: Mastoid air cells are clear. IMPRESSION: Decreased attenuation in the inferior somewhat posterior aspects of the right basal is suspicious for acute infarction in this area. Elsewhere there is mild atrophy with patchy periventricular small vessel disease. No hemorrhage. No well-defined mass or extra-axial fluid collection. Foci of arterial vascular calcification noted. Ethmoid sinus disease noted bilaterally. Leftward deviation of nasal septum.  Electronically Signed   By: Lowella Grip III M.D.   On: 05/21/2016 13:36   Mr Brain Wo Contrast (neuro Protocol)  Result Date: 05/21/2016 CLINICAL DATA:  Altered mental status.  Rule out CVA EXAM: MRI HEAD WITHOUT CONTRAST TECHNIQUE: Multiplanar, multiecho pulse sequences of the brain and surrounding structures were obtained without intravenous contrast. COMPARISON:  CT head 05/21/2016 FINDINGS: Brain: Image quality degraded by mild motion. Negative for acute infarct. Mild chronic microvascular ischemic change in the white matter. Mild cerebral atrophy. Negative for hydrocephalus. Negative for hemorrhage or mass. No shift of the midline structures. Vascular: Normal arterial flow voids. Skull and upper cervical spine: Negative Sinuses/Orbits: Mucosal edema paranasal sinuses.  Normal orbit. Other: None IMPRESSION: Atrophy and chronic microvascular ischemic change. No acute abnormality. Negative for acute infarct. Electronically Signed   By: Franchot Gallo M.D.   On: 05/21/2016 15:17    Procedures Procedures  COORDINATION OF CARE: 11:21 AM Discussed next steps with pt and his family. They verbalized understanding and are agreeable with the plan.    Medications Ordered in ED Medications  0.9 %  sodium chloride infusion ( Intravenous New Bag/Given 05/21/16 1256)  sodium chloride 0.9 % bolus 250 mL (0 mLs Intravenous Stopped 05/21/16 1256)     Initial Impression / Assessment and Plan / ED Course  I have reviewed the triage vital signs and the nursing notes.  Pertinent labs & imaging results that were available during my care of the patient were reviewed by me and considered in my medical decision making (see chart for details).  Clinical Course    CRITICAL CARE Performed by: Fredia Sorrow Total critical care time: 45 minutes Critical care time was exclusive of separately billable procedures and treating other patients. Critical care was necessary to treat or prevent imminent or  life-threatening deterioration. Critical care was time spent personally by me on the following activities: development of treatment plan with patient and/or surrogate as well as nursing, discussions with consultants, evaluation of patient's response to treatment, examination of patient, obtaining history from patient or surrogate, ordering and performing treatments and interventions, ordering and review of laboratory studies, ordering and review of radiographic studies, pulse oximetry and re-evaluation of patient's condition.   Patient with history of altered mental status for the past 2 days. History of significant alcohol intake. Patient reportedly had a seizure yesterday and questionable one today  no known history of seizures.  Workup here shows market hyponatremia. Patient now with IV fluids more awake and alert. Head CT raised concern for an acute stroke. MRI of the brain negative for stroke.  Patient will require admission for IV hydration with normal saline to correct the hyponatremia. Patient's sodium was 115.    I personally performed the services described in this documentation, which was scribed in my presence. The recorded information has been reviewed and is accurate.    Final Clinical Impressions(s) / ED Diagnoses   Final diagnoses:  Altered mental status, unspecified altered mental status type  Hyponatremia  Alcohol abuse    New Prescriptions New Prescriptions   No medications on file     Fredia Sorrow, MD 05/21/16 Rafael Capo, MD 05/21/16 1534

## 2016-05-22 LAB — BASIC METABOLIC PANEL WITH GFR
Anion gap: 10 (ref 5–15)
Anion gap: 8 (ref 5–15)
BUN: 8 mg/dL (ref 6–20)
BUN: 9 mg/dL (ref 6–20)
CO2: 26 mmol/L (ref 22–32)
CO2: 28 mmol/L (ref 22–32)
Calcium: 8.5 mg/dL — ABNORMAL LOW (ref 8.9–10.3)
Calcium: 8.6 mg/dL — ABNORMAL LOW (ref 8.9–10.3)
Chloride: 87 mmol/L — ABNORMAL LOW (ref 101–111)
Chloride: 88 mmol/L — ABNORMAL LOW (ref 101–111)
Creatinine, Ser: 0.67 mg/dL (ref 0.61–1.24)
Creatinine, Ser: 0.73 mg/dL (ref 0.61–1.24)
GFR calc Af Amer: >60 mL/min
GFR calc Af Amer: >60 mL/min
GFR calc non Af Amer: >60 mL/min
GFR calc non Af Amer: >60 mL/min
Glucose, Bld: 76 mg/dL (ref 65–99)
Glucose, Bld: 108 mg/dL — ABNORMAL HIGH (ref 65–99)
Potassium: 3.6 mmol/L (ref 3.5–5.1)
Potassium: 3.4 mmol/L — ABNORMAL LOW (ref 3.5–5.1)
Sodium: 123 mmol/L — ABNORMAL LOW (ref 135–145)
Sodium: 124 mmol/L — ABNORMAL LOW (ref 135–145)

## 2016-05-22 LAB — NA AND K (SODIUM & POTASSIUM), RAND UR
Potassium Urine: 29 mmol/L
Sodium, Ur: <10 mmol/L

## 2016-05-22 LAB — MRSA PCR SCREENING: MRSA by PCR: NEGATIVE

## 2016-05-22 LAB — OSMOLALITY: Osmolality: 260 mOsm/kg — ABNORMAL LOW (ref 275–295)

## 2016-05-22 LAB — BASIC METABOLIC PANEL
ANION GAP: 9 (ref 5–15)
BUN: 7 mg/dL (ref 6–20)
CO2: 27 mmol/L (ref 22–32)
Calcium: 8.2 mg/dL — ABNORMAL LOW (ref 8.9–10.3)
Chloride: 86 mmol/L — ABNORMAL LOW (ref 101–111)
Creatinine, Ser: 0.64 mg/dL (ref 0.61–1.24)
Glucose, Bld: 83 mg/dL (ref 65–99)
POTASSIUM: 3.1 mmol/L — AB (ref 3.5–5.1)
SODIUM: 122 mmol/L — AB (ref 135–145)

## 2016-05-22 LAB — OSMOLALITY, URINE: OSMOLALITY UR: 293 mosm/kg — AB (ref 300–900)

## 2016-05-22 MED ORDER — PNEUMOCOCCAL VAC POLYVALENT 25 MCG/0.5ML IJ INJ
0.5000 mL | INJECTION | INTRAMUSCULAR | Status: AC
  Administered 2016-05-23: 0.5 mL via INTRAMUSCULAR
  Filled 2016-05-22: qty 0.5

## 2016-05-22 MED ORDER — ENSURE ENLIVE PO LIQD
237.0000 mL | Freq: Two times a day (BID) | ORAL | Status: DC
  Administered 2016-05-22 – 2016-05-28 (×10): 237 mL via ORAL

## 2016-05-22 MED ORDER — INFLUENZA VAC SPLIT QUAD 0.5 ML IM SUSY
0.5000 mL | PREFILLED_SYRINGE | INTRAMUSCULAR | Status: AC
  Administered 2016-05-23: 0.5 mL via INTRAMUSCULAR
  Filled 2016-05-22: qty 0.5

## 2016-05-22 NOTE — Progress Notes (Signed)
Subjective: 55 years old male known case of alcohol abuse was admitted due to seizure and change in mental status. He has severe hyponatremia. He is started on treatment and his hyponatremia is improving, Patient is more alert and awake.  Objective: Vital signs in last 24 hours: Temp:  [97.8 F (36.6 C)-98.4 F (36.9 C)] 98.2 F (36.8 C) (12/21 1201) Pulse Rate:  [92-106] 97 (12/21 1100) Resp:  [16-29] 23 (12/21 1100) BP: (114-134)/(56-88) 123/59 (12/21 1100) SpO2:  [94 %-100 %] 100 % (12/21 1100) Weight:  [49.4 kg (108 lb 14.5 oz)] 49.4 kg (108 lb 14.5 oz) (12/21 0834) Weight change:  Last BM Date:  (unknown)  Intake/Output from previous day: 12/20 0701 - 12/21 0700 In: 1750 [I.V.:1500; IV Piggyback:250] Out: 1350 [Urine:1350]  PHYSICAL EXAM General appearance: alert and no distress Resp: diminished breath sounds bilaterally and rhonchi bilaterally Cardio: S1, S2 normal GI: soft, non-tender; bowel sounds normal; no masses,  no organomegaly Extremities: extremities normal, atraumatic, no cyanosis or edema  Lab Results:  Results for orders placed or performed during the hospital encounter of 05/21/16 (from the past 48 hour(s))  Comprehensive metabolic panel     Status: Abnormal   Collection Time: 05/21/16 11:45 AM  Result Value Ref Range   Sodium 115 (LL) 135 - 145 mmol/L    Comment: CRITICAL RESULT CALLED TO, READ BACK BY AND VERIFIED WITH: SWANSON,J AT 12:25PM ON 05/21/16 BY FESTERMAN,C    Potassium 3.2 (L) 3.5 - 5.1 mmol/L   Chloride 72 (L) 101 - 111 mmol/L   CO2 32 22 - 32 mmol/L   Glucose, Bld 88 65 - 99 mg/dL   BUN 8 6 - 20 mg/dL   Creatinine, Ser 0.59 (L) 0.61 - 1.24 mg/dL   Calcium 8.7 (L) 8.9 - 10.3 mg/dL   Total Protein 7.3 6.5 - 8.1 g/dL   Albumin 3.2 (L) 3.5 - 5.0 g/dL   AST 68 (H) 15 - 41 U/L   ALT 32 17 - 63 U/L   Alkaline Phosphatase 78 38 - 126 U/L   Total Bilirubin 1.7 (H) 0.3 - 1.2 mg/dL   GFR calc non Af Amer >60 >60 mL/min   GFR calc Af Amer >60  >60 mL/min    Comment: (NOTE) The eGFR has been calculated using the CKD EPI equation. This calculation has not been validated in all clinical situations. eGFR's persistently <60 mL/min signify possible Chronic Kidney Disease.    Anion gap 11 5 - 15  Lipase, blood     Status: None   Collection Time: 05/21/16 11:45 AM  Result Value Ref Range   Lipase 20 11 - 51 U/L  CBC with Differential/Platelet     Status: Abnormal   Collection Time: 05/21/16 11:45 AM  Result Value Ref Range   WBC 4.0 4.0 - 10.5 K/uL   RBC 3.88 (L) 4.22 - 5.81 MIL/uL   Hemoglobin 14.0 13.0 - 17.0 g/dL   HCT 37.1 (L) 39.0 - 52.0 %   MCV 95.6 78.0 - 100.0 fL   MCH 36.1 (H) 26.0 - 34.0 pg   MCHC 37.5 (H) 30.0 - 36.0 g/dL    Comment: RULED OUT INTERFERING SUBSTANCES   RDW 12.7 11.5 - 15.5 %   Platelets 144 (L) 150 - 400 K/uL   Neutrophils Relative % 70 %   Neutro Abs 2.8 1.7 - 7.7 K/uL   Lymphocytes Relative 20 %   Lymphs Abs 0.8 0.7 - 4.0 K/uL   Monocytes Relative 10 %  Monocytes Absolute 0.4 0.1 - 1.0 K/uL   Eosinophils Relative 1 %   Eosinophils Absolute 0.0 0.0 - 0.7 K/uL   Basophils Relative 0 %   Basophils Absolute 0.0 0.0 - 0.1 K/uL  Urinalysis, Routine w reflex microscopic     Status: Abnormal   Collection Time: 05/21/16 11:45 AM  Result Value Ref Range   Color, Urine AMBER (A) YELLOW    Comment: BIOCHEMICALS MAY BE AFFECTED BY COLOR   APPearance CLEAR CLEAR   Specific Gravity, Urine <1.005 (L) 1.005 - 1.030   pH 6.0 5.0 - 8.0   Glucose, UA NEGATIVE NEGATIVE mg/dL   Hgb urine dipstick NEGATIVE NEGATIVE   Bilirubin Urine NEGATIVE NEGATIVE   Ketones, ur NEGATIVE NEGATIVE mg/dL   Protein, ur NEGATIVE NEGATIVE mg/dL   Nitrite NEGATIVE NEGATIVE   Leukocytes, UA NEGATIVE NEGATIVE    Comment: Microscopic not done on urines with negative protein, blood, leukocytes, nitrite, or glucose < 500 mg/dL.  Ethanol     Status: Abnormal   Collection Time: 05/21/16 11:45 AM  Result Value Ref Range   Alcohol,  Ethyl (B) 45 (H) <5 mg/dL    Comment:        LOWEST DETECTABLE LIMIT FOR SERUM ALCOHOL IS 5 mg/dL FOR MEDICAL PURPOSES ONLY   Rapid urine drug screen (hospital performed)     Status: None   Collection Time: 05/21/16 11:45 AM  Result Value Ref Range   Opiates NONE DETECTED NONE DETECTED   Cocaine NONE DETECTED NONE DETECTED   Benzodiazepines NONE DETECTED NONE DETECTED   Amphetamines NONE DETECTED NONE DETECTED   Tetrahydrocannabinol NONE DETECTED NONE DETECTED   Barbiturates NONE DETECTED NONE DETECTED    Comment:        DRUG SCREEN FOR MEDICAL PURPOSES ONLY.  IF CONFIRMATION IS NEEDED FOR ANY PURPOSE, NOTIFY LAB WITHIN 5 DAYS.        LOWEST DETECTABLE LIMITS FOR URINE DRUG SCREEN Drug Class       Cutoff (ng/mL) Amphetamine      1000 Barbiturate      200 Benzodiazepine   601 Tricyclics       093 Opiates          300 Cocaine          300 THC              50   Protime-INR     Status: None   Collection Time: 05/21/16 11:45 AM  Result Value Ref Range   Prothrombin Time 12.2 11.4 - 15.2 seconds   INR 0.91   Ammonia     Status: Abnormal   Collection Time: 05/21/16 11:45 AM  Result Value Ref Range   Ammonia <9 (L) 9 - 35 umol/L  Basic metabolic panel     Status: Abnormal   Collection Time: 05/21/16  5:55 PM  Result Value Ref Range   Sodium 119 (LL) 135 - 145 mmol/L    Comment: CRITICAL RESULT CALLED TO, READ BACK BY AND VERIFIED WITH: VOGLER,T ON 05/21/16 AT 1845 BY LOY,C    Potassium 3.2 (L) 3.5 - 5.1 mmol/L   Chloride 82 (L) 101 - 111 mmol/L   CO2 27 22 - 32 mmol/L   Glucose, Bld 91 65 - 99 mg/dL   BUN 6 6 - 20 mg/dL   Creatinine, Ser 0.56 (L) 0.61 - 1.24 mg/dL   Calcium 8.4 (L) 8.9 - 10.3 mg/dL   GFR calc non Af Amer >60 >60 mL/min   GFR calc Af Amer >  60 >60 mL/min    Comment: (NOTE) The eGFR has been calculated using the CKD EPI equation. This calculation has not been validated in all clinical situations. eGFR's persistently <60 mL/min signify possible Chronic  Kidney Disease.    Anion gap 10 5 - 15  Urine rapid drug screen (hosp performed)not at [facility]     Status: None   Collection Time: 05/21/16  8:35 PM  Result Value Ref Range   Opiates NONE DETECTED NONE DETECTED   Cocaine NONE DETECTED NONE DETECTED   Benzodiazepines NONE DETECTED NONE DETECTED   Amphetamines NONE DETECTED NONE DETECTED   Tetrahydrocannabinol NONE DETECTED NONE DETECTED   Barbiturates NONE DETECTED NONE DETECTED    Comment:        DRUG SCREEN FOR MEDICAL PURPOSES ONLY.  IF CONFIRMATION IS NEEDED FOR ANY PURPOSE, NOTIFY LAB WITHIN 5 DAYS.        LOWEST DETECTABLE LIMITS FOR URINE DRUG SCREEN Drug Class       Cutoff (ng/mL) Amphetamine      1000 Barbiturate      200 Benzodiazepine   643 Tricyclics       329 Opiates          300 Cocaine          300 THC              50   Magnesium     Status: Abnormal   Collection Time: 05/21/16  8:42 PM  Result Value Ref Range   Magnesium 1.4 (L) 1.7 - 2.4 mg/dL  Phosphorus     Status: Abnormal   Collection Time: 05/21/16  8:42 PM  Result Value Ref Range   Phosphorus 2.2 (L) 2.5 - 4.6 mg/dL  Protime-INR     Status: None   Collection Time: 05/21/16  8:42 PM  Result Value Ref Range   Prothrombin Time 12.6 11.4 - 15.2 seconds   INR 5.18   Basic metabolic panel     Status: Abnormal   Collection Time: 05/21/16  8:43 PM  Result Value Ref Range   Sodium 121 (L) 135 - 145 mmol/L   Potassium 3.5 3.5 - 5.1 mmol/L   Chloride 83 (L) 101 - 111 mmol/L   CO2 28 22 - 32 mmol/L   Glucose, Bld 86 65 - 99 mg/dL   BUN 7 6 - 20 mg/dL   Creatinine, Ser 0.65 0.61 - 1.24 mg/dL   Calcium 8.5 (L) 8.9 - 10.3 mg/dL   GFR calc non Af Amer >60 >60 mL/min   GFR calc Af Amer >60 >60 mL/min    Comment: (NOTE) The eGFR has been calculated using the CKD EPI equation. This calculation has not been validated in all clinical situations. eGFR's persistently <60 mL/min signify possible Chronic Kidney Disease.    Anion gap 10 5 - 15  Na and K (sodium  & potassium), rand urine     Status: None   Collection Time: 05/21/16 11:45 PM  Result Value Ref Range   Sodium, Ur <10 mmol/L   Potassium Urine 29 mmol/L  Basic metabolic panel     Status: Abnormal   Collection Time: 05/22/16  1:03 AM  Result Value Ref Range   Sodium 122 (L) 135 - 145 mmol/L   Potassium 3.1 (L) 3.5 - 5.1 mmol/L   Chloride 86 (L) 101 - 111 mmol/L   CO2 27 22 - 32 mmol/L   Glucose, Bld 83 65 - 99 mg/dL   BUN 7 6 - 20 mg/dL  Creatinine, Ser 0.64 0.61 - 1.24 mg/dL   Calcium 8.2 (L) 8.9 - 10.3 mg/dL   GFR calc non Af Amer >60 >60 mL/min   GFR calc Af Amer >60 >60 mL/min    Comment: (NOTE) The eGFR has been calculated using the CKD EPI equation. This calculation has not been validated in all clinical situations. eGFR's persistently <60 mL/min signify possible Chronic Kidney Disease.    Anion gap 9 5 - 15  Basic metabolic panel     Status: Abnormal   Collection Time: 05/22/16  5:11 AM  Result Value Ref Range   Sodium 123 (L) 135 - 145 mmol/L   Potassium 3.6 3.5 - 5.1 mmol/L   Chloride 87 (L) 101 - 111 mmol/L   CO2 26 22 - 32 mmol/L   Glucose, Bld 76 65 - 99 mg/dL   BUN 8 6 - 20 mg/dL   Creatinine, Ser 0.67 0.61 - 1.24 mg/dL   Calcium 8.5 (L) 8.9 - 10.3 mg/dL   GFR calc non Af Amer >60 >60 mL/min   GFR calc Af Amer >60 >60 mL/min    Comment: (NOTE) The eGFR has been calculated using the CKD EPI equation. This calculation has not been validated in all clinical situations. eGFR's persistently <60 mL/min signify possible Chronic Kidney Disease.    Anion gap 10 5 - 15  Basic metabolic panel     Status: Abnormal   Collection Time: 05/22/16  9:31 AM  Result Value Ref Range   Sodium 124 (L) 135 - 145 mmol/L   Potassium 3.4 (L) 3.5 - 5.1 mmol/L   Chloride 88 (L) 101 - 111 mmol/L   CO2 28 22 - 32 mmol/L   Glucose, Bld 108 (H) 65 - 99 mg/dL   BUN 9 6 - 20 mg/dL   Creatinine, Ser 0.73 0.61 - 1.24 mg/dL   Calcium 8.6 (L) 8.9 - 10.3 mg/dL   GFR calc non Af Amer  >60 >60 mL/min   GFR calc Af Amer >60 >60 mL/min    Comment: (NOTE) The eGFR has been calculated using the CKD EPI equation. This calculation has not been validated in all clinical situations. eGFR's persistently <60 mL/min signify possible Chronic Kidney Disease.    Anion gap 8 5 - 15    ABGS No results for input(s): PHART, PO2ART, TCO2, HCO3 in the last 72 hours.  Invalid input(s): PCO2 CULTURES No results found for this or any previous visit (from the past 240 hour(s)). Studies/Results: Dg Chest 2 View  Result Date: 05/21/2016 CLINICAL DATA:  Altered mental status EXAM: CHEST  2 VIEW COMPARISON:  02/29/2016 FINDINGS: Right middle lobe collapse unchanged. Elevated right hemidiaphragm unchanged. Left lung remains clear. Heart size and vascularity normal. Pulmonary hyperinflation. IMPRESSION: Right middle lobe collapse unchanged. Electronically Signed   By: Franchot Gallo M.D.   On: 05/21/2016 13:43   Ct Head Wo Contrast  Result Date: 05/21/2016 CLINICAL DATA:  Mild vascular calcification noted.  There are foci EXAM: CT HEAD WITHOUT CONTRAST TECHNIQUE: Contiguous axial images were obtained from the base of the skull through the vertex without intravenous contrast. COMPARISON:  None. FINDINGS: Brain: There is mild diffuse atrophy. There is no intracranial mass, hemorrhage, extra-axial fluid collection, or midline shift. There is decreased attenuation in the inferior right basal ganglia region involving the inferior aspect of the right globus pallidum and putamen on the right as well as portions of the claustrum and extreme capsule on the right. This finding is best appreciated on axial  slice 14 series 2. No other findings concerning for potential acute infarct elsewhere noted. Vascular: There is no hyperdense vessel. There is calcification in both carotid siphon regions. Skull: Bones are osteoporotic. No focal bone lesions are evident in the calvarium. Sinuses/Orbits: There is mucosal  thickening in several ethmoid air cell regions bilaterally. Visualized paranasal sinuses elsewhere clear. There is leftward deviation of the nasal septum. Orbits appear symmetric bilaterally. Other: Mastoid air cells are clear. IMPRESSION: Decreased attenuation in the inferior somewhat posterior aspects of the right basal is suspicious for acute infarction in this area. Elsewhere there is mild atrophy with patchy periventricular small vessel disease. No hemorrhage. No well-defined mass or extra-axial fluid collection. Foci of arterial vascular calcification noted. Ethmoid sinus disease noted bilaterally. Leftward deviation of nasal septum. Electronically Signed   By: Lowella Grip III M.D.   On: 05/21/2016 13:36   Mr Brain Wo Contrast (neuro Protocol)  Result Date: 05/21/2016 CLINICAL DATA:  Altered mental status.  Rule out CVA EXAM: MRI HEAD WITHOUT CONTRAST TECHNIQUE: Multiplanar, multiecho pulse sequences of the brain and surrounding structures were obtained without intravenous contrast. COMPARISON:  CT head 05/21/2016 FINDINGS: Brain: Image quality degraded by mild motion. Negative for acute infarct. Mild chronic microvascular ischemic change in the white matter. Mild cerebral atrophy. Negative for hydrocephalus. Negative for hemorrhage or mass. No shift of the midline structures. Vascular: Normal arterial flow voids. Skull and upper cervical spine: Negative Sinuses/Orbits: Mucosal edema paranasal sinuses.  Normal orbit. Other: None IMPRESSION: Atrophy and chronic microvascular ischemic change. No acute abnormality. Negative for acute infarct. Electronically Signed   By: Franchot Gallo M.D.   On: 05/21/2016 15:17    Medications: I have reviewed the patient's current medications.  Assesment:  Principal Problem:   Hyponatremia Active Problems:   Lung mass   Hypokalemia   Mild malnutrition (HCC)   Thrombocytopenia (HCC)   Seizure (HCC)   Alcohol dependence ([place])   Tobacco  dependence    Plan:  Medications reviewed Will increase N/S to 100 cc/hr Will monitor BMP Neurology consult    LOS: 1 day   Jesus Walker 05/22/2016, 12:11 PM

## 2016-05-22 NOTE — Progress Notes (Signed)
Initial Nutrition Assessment  DOCUMENTATION CODES:  Not applicable, Non-severe (moderate) malnutrition in context of social or environmental circumstances   Pt meets criteria for MODERATE MALNUTRITION in the context of social/environmental circumstances as evidenced by Moderate muscle/fat wasting and an estimated PO intake that met < / =  50% of needs for >/ = to 1 month.   INTERVENTION:  MVI with minerals  Ensure Enlive po BID, each supplement provides 350 kcal and 20 grams of protein  NUTRITION DIAGNOSIS:  Poor nurtition Quality of Life related to  ETOH dependence as evidenced by Consuming 12 beers, wine, and 1/5th liquor each day.   GOAL:  Patient will meet greater than or equal to 90% of their needs  MONITOR:  PO intake, Supplement acceptance, Labs, Weight trends, I & O's  REASON FOR ASSESSMENT:  Consult Assessment of nutrition requirement/status  ASSESSMENT:  55 y/o male PMHx HTN, GERD, ETOH dependence presenting with seizures and hallucinations. Worked up hyponatremia and for possible CVA.   Per admitting note, pt has not gone more then 2 days w/o alcohol in a year.    He is drinking an incessant amount of alcohol. Today, pt says he was not eating well at home and obtaining the vast majority of his calories from alcohol. He says his ETOH consumption has gotten worse in the past few months. He does eat some foods such as chicken or pinto beans. He did not take any vitamins, mineral or oral supplements.   He denies frank nausea/vomiting, though notes he has been "spitting up some". Denies Constipation/diarrhea  He does not sound to have a UBW. He says he was last weighed at 116 lbs, but cannot remember when this was.   He is still able to walk ok.   He is agreeable to Ensure/mvi supplementation. He denied having any specific food requests.   Pt certainly has had exceedingly poor nutrition for the forseeable past, but based on physical exam and objective data, only meets  criteria for moderate malnutrition  NFPE: Moderate fat/muscle wasting.   Medications:ppi, folate, thiamin, NS  IVF Labs: Severe hyponatremia resolving, Phos:2.2, MG: 1.4, Albumin: 3.2   Recent Labs Lab 05/21/16 2042  05/22/16 0103 05/22/16 0511 05/22/16 0931  NA  --   < > 122* 123* 124*  K  --   < > 3.1* 3.6 3.4*  CL  --   < > 86* 87* 88*  CO2  --   < > '27 26 28  '$ BUN  --   < > '7 8 9  '$ CREATININE  --   < > 0.64 0.67 0.73  CALCIUM  --   < > 8.2* 8.5* 8.6*  MG 1.4*  --   --   --   --   PHOS 2.2*  --   --   --   --   GLUCOSE  --   < > 83 76 108*  < > = values in this interval not displayed.  Diet Order:  Diet regular Room service appropriate? Yes; Fluid consistency: Thin  Skin:  Reviewed, no issues  Last BM:  Unknown  Height:  Ht Readings from Last 1 Encounters:  05/21/16 '5\' 1"'$  (1.549 m)   Weight:  Wt Readings from Last 1 Encounters:  05/22/16 108 lb 14.5 oz (49.4 kg)   Wt Readings from Last 10 Encounters:  05/22/16 108 lb 14.5 oz (49.4 kg)  01/03/16 111 lb (50.3 kg)  12/26/15 116 lb (52.6 kg)  05/02/14 135 lb (61.2 kg)  03/24/14 130 lb (59 kg)   Ideal Body Weight:  50.91 kg  BMI:  Body mass index is 20.58 kg/m.  Estimated Nutritional Needs:  Kcal:  1500-1700 kcals (30-34 kcal/kg bw) Protein:  55-65 g Pro (1.1-1.3 g/kg bw) Fluid:  >1.5 L fluid (30 ml/kg bw)  EDUCATION NEEDS:  Education needs no appropriate at this time  Burtis Junes RD, LDN, [place] [facility] Pager: [phone] 05/22/2016 2:15 PM

## 2016-05-23 ENCOUNTER — Inpatient Hospital Stay (HOSPITAL_COMMUNITY)
Admit: 2016-05-23 | Discharge: 2016-05-23 | Disposition: A | Discharge status: Home / Self Care | Payer: Medicaid Other | Attending: Neurology | Admitting: Neurology

## 2016-05-23 DIAGNOSIS — E440 Moderate protein-calorie malnutrition: Secondary | ICD-10-CM | POA: Insufficient documentation

## 2016-05-23 LAB — TSH: TSH: 2.694 u[IU]/mL (ref 0.350–4.500)

## 2016-05-23 LAB — VITAMIN B12: Vitamin B-12: 837 pg/mL (ref 180–914)

## 2016-05-23 MED ORDER — FOLIC ACID 1 MG PO TABS
1.0000 mg | ORAL_TABLET | Freq: Every day | ORAL | Status: DC
  Administered 2016-05-23 – 2016-05-28 (×6): 1 mg via ORAL
  Filled 2016-05-23 (×6): qty 1

## 2016-05-23 NOTE — Progress Notes (Signed)
PHARMACIST - PHYSICIAN COMMUNICATION  DR:     CONCERNING: IV to Oral Route Change Policy  RECOMMENDATION: This patient is receiving Folic acid by the intravenous route.  Based on criteria approved by the Pharmacy and Therapeutics Committee, the intravenous medication(s) is/are being converted to the equivalent oral dose form(s).   DESCRIPTION: These criteria include:  The patient is eating (either orally or via tube) and/or has been taking other orally administered medications for a least 24 hours  The patient has no evidence of active gastrointestinal bleeding or impaired GI absorption (gastrectomy, short bowel, patient on TNA or NPO).  If you have questions about this conversion, please contact the Pharmacy Department  [x]   [phone] )  [facility] []   [phone] )  [facility] []   [phone] )  [facility] []   [phone] )  [facility] []   [phone] )  [place], Tonette K[place] [place], [facility] 05/23/2016 10:50 AM

## 2016-05-23 NOTE — Consult Note (Addendum)
[place] A. Merlene Laughter, MD     [email or web address]          Jesus Walker is an 55 y.o. male.   ASSESSMENT/PLAN: Acute encephalopathy: The primary etiology is from multiple metabolic derangements. 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  Abnormal movements/shaking likely due to metabolic disturbance in particular hyponatremia. I doubt this patient has had convulsions.  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 Observe     RECOMMENDATION: EEG. No need for seizure medication. Alcohol cessation. Dementia labs. Continue thiamine.  The patient is a 55 year old black male who has a long-standing history of alcoholism. The patient has had some confusion over the last few days. The patient is aware of being confused. Most notable is the patient stated he has been having visual hallucinations such as seeing elephants. The family got concerned and sought medical attention. It appears the patient has also had some shaking of the extremities bilaterally not associated with loss of consciousness. The patient presented in the hospital and has had multiple metabolic derangements including hyponatremia of 115. The patient reports drinking 3" beverages per day" but other history in the chart reveals that he drinks a 12 pack a day in addition to whine and possibly even hard liquor. The patient does not report focal neurological symptoms. He denies headaches, numbness or tingling. He denies chest pain specialists of breath. He is noted to have atrophy of intrinsic hand muscles on the left side. He tells me this has been a long-standing issue along with left upper extremity pain. The patient does not report having any additional symptoms at this time. The review of systems is otherwise negative.  GENERAL: This a pleasant thin man who appears in no acute distress. He is somewhat disheveled. He does have chronic stigmata of alcoholism.  HEENT: Supple.  Atraumatic normocephalic.   ABDOMEN: soft  EXTREMITIES: No edema   BACK: Normal.  SKIN: Normal by inspection.    MENTAL STATUS: Alert and oriented to person and place, month and year. Speech is normal. Insight is limited. He does seem to have some difficulty with memory.  CRANIAL NERVES: Pupils are equal, round and reactive to light and accommodation; extra ocular movements are full, there is no significant nystagmus; visual fields are full; upper and lower facial muscles are normal in strength and symmetric, there is no flattening of the nasolabial folds; tongue is midline; uvula is midline; shoulder elevation is normal.  MOTOR: Atrophy of The dorsal interossei muscles on the left side particularly the first dorsal interossei. Other muscles and extremities show normal tone, bulk and strength; no pronator drift.  COORDINATION: Left finger to nose is normal, right finger to nose is normal, No rest tremor; no intention tremor; no postural tremor; no bradykinesia.  REFLEXES: Deep tendon reflexes are symmetrical and normal. Babinski reflexes are flexor bilaterally.   SENSATION: Normal to light touch.      Blood pressure 115/63, pulse 80, temperature 98.4 F (36.9 C), temperature source Oral, resp. rate 20, height '5\' 1"'$  (1.549 m), weight 115 lb 15.4 oz (52.6 kg), SpO2 98 %.  Past Medical History:  Diagnosis Date  . Alcohol dependence ([place])   . Asthma   . GERD (gastroesophageal reflux disease)   . Hypertension     Past Surgical History:  Procedure Laterality Date  . BRONCHIAL BRUSHINGS  01/03/2016   Procedure: BRONCHIAL BRUSHINGS;  Surgeon: Sinda Du, MD;  [facility];  Service: Cardiopulmonary;;  . BRONCHIAL WASHINGS  01/03/2016  Procedure: BRONCHIAL WASHINGS;  Surgeon: Sinda Du, MD;  Location: [facility];  Service: Cardiopulmonary;;  . FLEXIBLE BRONCHOSCOPY N/A 01/03/2016   Procedure: FLEXIBLE BRONCHOSCOPY;  Surgeon: Sinda Du, MD;  Location[facility];  Service: Cardiopulmonary;  Laterality: N/A;    Family History  Problem Relation Age of Onset  . Asthma Mother 25  . Asthma Father 16    Social History:  reports that he has been smoking Cigarettes.  He has been smoking about 1.00 pack per day. He has never used smokeless tobacco. He reports that he drinks about 7.2 oz of alcohol per week . He reports that he does not use drugs.  Allergies: No Known Allergies  Medications: Prior to Admission medications   Medication Sig Start Date End Date Taking? Authorizing Provider  amLODipine (NORVASC) 5 MG tablet Take 5 mg by mouth daily.   Yes Historical Provider, MD  amoxicillin (AMOXIL) 500 MG tablet Take 500 mg by mouth 3 (three) times daily.   Yes Historical Provider, MD  omeprazole (PRILOSEC) 20 MG capsule Take 20 mg by mouth daily.   Yes Historical Provider, MD    Scheduled Meds: . amLODipine  5 mg Oral Daily  . enoxaparin (LOVENOX) injection  40 mg Subcutaneous Q24H  . feeding supplement (ENSURE ENLIVE)  237 mL Oral BID BM  . folic acid  1 mg Oral Daily  . Influenza vac split quadrivalent PF  0.5 mL Intramuscular Tomorrow-1000  . pantoprazole  40 mg Oral Daily  . pneumococcal 23 valent vaccine  0.5 mL Intramuscular Tomorrow-1000  . thiamine  100 mg Intravenous Daily   Continuous Infusions: . sodium chloride 100 mL/hr at 05/23/16 0612   PRN Meds:.acetaminophen **OR** acetaminophen, LORazepam, LORazepam, ondansetron **OR** ondansetron (ZOFRAN) IV     Results for orders placed or performed during the hospital encounter of 05/21/16 (from the past 48 hour(s))  Comprehensive metabolic panel     Status: Abnormal   Collection Time: 05/21/16 11:45 AM  Result Value Ref Range   Sodium 115 (LL) 135 - 145 mmol/L    Comment: CRITICAL RESULT CALLED TO, READ BACK BY AND VERIFIED WITH: SWANSON,J AT 12:25PM ON 05/21/16 BY FESTERMAN,C    Potassium 3.2 (L) 3.5 - 5.1 mmol/L   Chloride 72 (L) 101 - 111 mmol/L   CO2 32 22 - 32 mmol/L    Glucose, Bld 88 65 - 99 mg/dL   BUN 8 6 - 20 mg/dL   Creatinine, Ser 0.59 (L) 0.61 - 1.24 mg/dL   Calcium 8.7 (L) 8.9 - 10.3 mg/dL   Total Protein 7.3 6.5 - 8.1 g/dL   Albumin 3.2 (L) 3.5 - 5.0 g/dL   AST 68 (H) 15 - 41 U/L   ALT 32 17 - 63 U/L   Alkaline Phosphatase 78 38 - 126 U/L   Total Bilirubin 1.7 (H) 0.3 - 1.2 mg/dL   GFR calc non Af Amer >60 >60 mL/min   GFR calc Af Amer >60 >60 mL/min    Comment: (NOTE) The eGFR has been calculated using the CKD EPI equation. This calculation has not been validated in all clinical situations. eGFR's persistently <60 mL/min signify possible Chronic Kidney Disease.    Anion gap 11 5 - 15  Lipase, blood     Status: None   Collection Time: 05/21/16 11:45 AM  Result Value Ref Range   Lipase 20 11 - 51 U/L  CBC with Differential/Platelet     Status: Abnormal   Collection Time: 05/21/16 11:45 AM  Result Value Ref Range   WBC 4.0 4.0 - 10.5 K/uL   RBC 3.88 (L) 4.22 - 5.81 MIL/uL   Hemoglobin 14.0 13.0 - 17.0 g/dL   HCT 37.1 (L) 39.0 - 52.0 %   MCV 95.6 78.0 - 100.0 fL   MCH 36.1 (H) 26.0 - 34.0 pg   MCHC 37.5 (H) 30.0 - 36.0 g/dL    Comment: RULED OUT INTERFERING SUBSTANCES   RDW 12.7 11.5 - 15.5 %   Platelets 144 (L) 150 - 400 K/uL   Neutrophils Relative % 70 %   Neutro Abs 2.8 1.7 - 7.7 K/uL   Lymphocytes Relative 20 %   Lymphs Abs 0.8 0.7 - 4.0 K/uL   Monocytes Relative 10 %   Monocytes Absolute 0.4 0.1 - 1.0 K/uL   Eosinophils Relative 1 %   Eosinophils Absolute 0.0 0.0 - 0.7 K/uL   Basophils Relative 0 %   Basophils Absolute 0.0 0.0 - 0.1 K/uL  Urinalysis, Routine w reflex microscopic     Status: Abnormal   Collection Time: 05/21/16 11:45 AM  Result Value Ref Range   Color, Urine AMBER (A) YELLOW    Comment: BIOCHEMICALS MAY BE AFFECTED BY COLOR   APPearance CLEAR CLEAR   Specific Gravity, Urine <1.005 (L) 1.005 - 1.030   pH 6.0 5.0 - 8.0   Glucose, UA NEGATIVE NEGATIVE mg/dL   Hgb urine dipstick NEGATIVE NEGATIVE    Bilirubin Urine NEGATIVE NEGATIVE   Ketones, ur NEGATIVE NEGATIVE mg/dL   Protein, ur NEGATIVE NEGATIVE mg/dL   Nitrite NEGATIVE NEGATIVE   Leukocytes, UA NEGATIVE NEGATIVE    Comment: Microscopic not done on urines with negative protein, blood, leukocytes, nitrite, or glucose < 500 mg/dL.  Ethanol     Status: Abnormal   Collection Time: 05/21/16 11:45 AM  Result Value Ref Range   Alcohol, Ethyl (B) 45 (H) <5 mg/dL    Comment:        LOWEST DETECTABLE LIMIT FOR SERUM ALCOHOL IS 5 mg/dL FOR MEDICAL PURPOSES ONLY   Rapid urine drug screen (hospital performed)     Status: None   Collection Time: 05/21/16 11:45 AM  Result Value Ref Range   Opiates NONE DETECTED NONE DETECTED   Cocaine NONE DETECTED NONE DETECTED   Benzodiazepines NONE DETECTED NONE DETECTED   Amphetamines NONE DETECTED NONE DETECTED   Tetrahydrocannabinol NONE DETECTED NONE DETECTED   Barbiturates NONE DETECTED NONE DETECTED    Comment:        DRUG SCREEN FOR MEDICAL PURPOSES ONLY.  IF CONFIRMATION IS NEEDED FOR ANY PURPOSE, NOTIFY LAB WITHIN 5 DAYS.        LOWEST DETECTABLE LIMITS FOR URINE DRUG SCREEN Drug Class       Cutoff (ng/mL) Amphetamine      1000 Barbiturate      200 Benzodiazepine   030 Tricyclics       092 Opiates          300 Cocaine          300 THC              50   Protime-INR     Status: None   Collection Time: 05/21/16 11:45 AM  Result Value Ref Range   Prothrombin Time 12.2 11.4 - 15.2 seconds   INR 0.91   Ammonia     Status: Abnormal   Collection Time: 05/21/16 11:45 AM  Result Value Ref Range   Ammonia <9 (L) 9 - 35 umol/L  Osmolality  Status: Abnormal   Collection Time: 05/21/16 11:45 AM  Result Value Ref Range   Osmolality 260 (L) 275 - 295 mOsm/kg    Comment: Performed at [place] metabolic panel     Status: Abnormal   Collection Time: 05/21/16  5:55 PM  Result Value Ref Range   Sodium 119 (LL) 135 - 145 mmol/L    Comment: CRITICAL RESULT CALLED TO,  READ BACK BY AND VERIFIED WITH: VOGLER,T ON 05/21/16 AT 1845 BY LOY,C    Potassium 3.2 (L) 3.5 - 5.1 mmol/L   Chloride 82 (L) 101 - 111 mmol/L   CO2 27 22 - 32 mmol/L   Glucose, Bld 91 65 - 99 mg/dL   BUN 6 6 - 20 mg/dL   Creatinine, Ser 0.56 (L) 0.61 - 1.24 mg/dL   Calcium 8.4 (L) 8.9 - 10.3 mg/dL   GFR calc non Af Amer >60 >60 mL/min   GFR calc Af Amer >60 >60 mL/min    Comment: (NOTE) The eGFR has been calculated using the CKD EPI equation. This calculation has not been validated in all clinical situations. eGFR's persistently <60 mL/min signify possible Chronic Kidney Disease.    Anion gap 10 5 - 15  Urine rapid drug screen (hosp performed)not at [facility]     Status: None   Collection Time: 05/21/16  8:35 PM  Result Value Ref Range   Opiates NONE DETECTED NONE DETECTED   Cocaine NONE DETECTED NONE DETECTED   Benzodiazepines NONE DETECTED NONE DETECTED   Amphetamines NONE DETECTED NONE DETECTED   Tetrahydrocannabinol NONE DETECTED NONE DETECTED   Barbiturates NONE DETECTED NONE DETECTED    Comment:        DRUG SCREEN FOR MEDICAL PURPOSES ONLY.  IF CONFIRMATION IS NEEDED FOR ANY PURPOSE, NOTIFY LAB WITHIN 5 DAYS.        LOWEST DETECTABLE LIMITS FOR URINE DRUG SCREEN Drug Class       Cutoff (ng/mL) Amphetamine      1000 Barbiturate      200 Benzodiazepine   419 Tricyclics       622 Opiates          300 Cocaine          300 THC              50   Magnesium     Status: Abnormal   Collection Time: 05/21/16  8:42 PM  Result Value Ref Range   Magnesium 1.4 (L) 1.7 - 2.4 mg/dL  Phosphorus     Status: Abnormal   Collection Time: 05/21/16  8:42 PM  Result Value Ref Range   Phosphorus 2.2 (L) 2.5 - 4.6 mg/dL  Protime-INR     Status: None   Collection Time: 05/21/16  8:42 PM  Result Value Ref Range   Prothrombin Time 12.6 11.4 - 15.2 seconds   INR 2.97   Basic metabolic panel     Status: Abnormal   Collection Time: 05/21/16  8:43 PM  Result Value Ref Range   Sodium 121  (L) 135 - 145 mmol/L   Potassium 3.5 3.5 - 5.1 mmol/L   Chloride 83 (L) 101 - 111 mmol/L   CO2 28 22 - 32 mmol/L   Glucose, Bld 86 65 - 99 mg/dL   BUN 7 6 - 20 mg/dL   Creatinine, Ser 0.65 0.61 - 1.24 mg/dL   Calcium 8.5 (L) 8.9 - 10.3 mg/dL   GFR calc non Af Amer >60 >60 mL/min   GFR  calc Af Amer >60 >60 mL/min    Comment: (NOTE) The eGFR has been calculated using the CKD EPI equation. This calculation has not been validated in all clinical situations. eGFR's persistently <60 mL/min signify possible Chronic Kidney Disease.    Anion gap 10 5 - 15  Osmolality, urine     Status: Abnormal   Collection Time: 05/21/16 11:43 PM  Result Value Ref Range   Osmolality, Ur 293 (L) 300 - 900 mOsm/kg    Comment: Performed at [facility]  Na and K (sodium & potassium), rand urine     Status: None   Collection Time: 05/21/16 11:45 PM  Result Value Ref Range   Sodium, Ur <10 mmol/L   Potassium Urine 29 mmol/L  Basic metabolic panel     Status: Abnormal   Collection Time: 05/22/16  1:03 AM  Result Value Ref Range   Sodium 122 (L) 135 - 145 mmol/L   Potassium 3.1 (L) 3.5 - 5.1 mmol/L   Chloride 86 (L) 101 - 111 mmol/L   CO2 27 22 - 32 mmol/L   Glucose, Bld 83 65 - 99 mg/dL   BUN 7 6 - 20 mg/dL   Creatinine, Ser 0.64 0.61 - 1.24 mg/dL   Calcium 8.2 (L) 8.9 - 10.3 mg/dL   GFR calc non Af Amer >60 >60 mL/min   GFR calc Af Amer >60 >60 mL/min    Comment: (NOTE) The eGFR has been calculated using the CKD EPI equation. This calculation has not been validated in all clinical situations. eGFR's persistently <60 mL/min signify possible Chronic Kidney Disease.    Anion gap 9 5 - 15  Basic metabolic panel     Status: Abnormal   Collection Time: 05/22/16  5:11 AM  Result Value Ref Range   Sodium 123 (L) 135 - 145 mmol/L   Potassium 3.6 3.5 - 5.1 mmol/L   Chloride 87 (L) 101 - 111 mmol/L   CO2 26 22 - 32 mmol/L   Glucose, Bld 76 65 - 99 mg/dL   BUN 8 6 - 20 mg/dL   Creatinine, Ser  0.67 0.61 - 1.24 mg/dL   Calcium 8.5 (L) 8.9 - 10.3 mg/dL   GFR calc non Af Amer >60 >60 mL/min   GFR calc Af Amer >60 >60 mL/min    Comment: (NOTE) The eGFR has been calculated using the CKD EPI equation. This calculation has not been validated in all clinical situations. eGFR's persistently <60 mL/min signify possible Chronic Kidney Disease.    Anion gap 10 5 - 15  MRSA PCR Screening     Status: None   Collection Time: 05/22/16  8:44 AM  Result Value Ref Range   MRSA by PCR NEGATIVE NEGATIVE    Comment:        The GeneXpert MRSA Assay (FDA approved for NASAL specimens only), is one component of a comprehensive MRSA colonization surveillance program. It is not intended to diagnose MRSA infection nor to guide or monitor treatment for MRSA infections.   Basic metabolic panel     Status: Abnormal   Collection Time: 05/22/16  9:31 AM  Result Value Ref Range   Sodium 124 (L) 135 - 145 mmol/L   Potassium 3.4 (L) 3.5 - 5.1 mmol/L   Chloride 88 (L) 101 - 111 mmol/L   CO2 28 22 - 32 mmol/L   Glucose, Bld 108 (H) 65 - 99 mg/dL   BUN 9 6 - 20 mg/dL   Creatinine, Ser 0.73 0.61 -  1.24 mg/dL   Calcium 8.6 (L) 8.9 - 10.3 mg/dL   GFR calc non Af Amer >60 >60 mL/min   GFR calc Af Amer >60 >60 mL/min    Comment: (NOTE) The eGFR has been calculated using the CKD EPI equation. This calculation has not been validated in all clinical situations. eGFR's persistently <60 mL/min signify possible Chronic Kidney Disease.    Anion gap 8 5 - 15    Studies/Results:  BRAIN MRI FINDINGS: Brain: Image quality degraded by mild motion.  Negative for acute infarct. Mild chronic microvascular ischemic change in the white matter. Mild cerebral atrophy. Negative for hydrocephalus. Negative for hemorrhage or mass. No shift of the midline structures.  Vascular: Normal arterial flow voids.  Skull and upper cervical spine: Negative  Sinuses/Orbits: Mucosal edema paranasal sinuses.  Normal  orbit.  Other: None  IMPRESSION: Atrophy and chronic microvascular ischemic change. No acute abnormality. Negative for acute infarct.       The brain MRI scan is reviewed in person. No acute infarcts are seen on DWI. 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 There is moderate global atrophy. No hemorrhages seen on SWI.     Jaloni Davoli A. Merlene Laughter, M.D.  Diplomate, Tax adviser of Psychiatry and Neurology ( Neurology). 05/23/2016, 10:52 AM

## 2016-05-23 NOTE — Progress Notes (Signed)
EEG completed, results pending. 

## 2016-05-23 NOTE — Care Management Note (Signed)
Case Management Note  Patient Details  Name: ASHAI BUNCE MRN: [id number] Date of Birth: 03/08/1961  Subjective/Objective:                  Pt admitted with hyponatremia. He is from home, lives with his gf. He is ind with ADL's at baseline. He has PCP, uses [facility] for transportation and has medicaid that covers medications. Pt is asking forcane to use them ambulation. He has chosen [facility] from list of DME providers. Blake Divine, of [facility] is aware of referral and will deliver cane to pt's room today. Pt may benefit from PT eval prior to DC. Pt with ETOH abuse PTA.   Action/Plan: Pt plans to return home with self care.   Expected Discharge Date:    05/26/2016              Expected Discharge Plan:  Home/Self Care / In-House Referral:  NA/  Discharge planning Services  CM Consult  Post Acute Care Choice:  Durable Medical Equipment Choice offered to:  Patient  DME Arranged:  Kasandra Knudsen DME Agency:  [facility].  Status of Service:  In process, will continue to follow  Sherald Barge, RN 05/23/2016, 12:35 PM

## 2016-05-23 NOTE — Progress Notes (Signed)
Subjective: Patient feels better today. His mental status has improved. No seizure since admission. No new complaint.  Objective: Vital signs in last 24 hours: Temp:  [98 F (36.7 C)-98.6 F (37 C)] 98.4 F (36.9 C) (12/22 0746) Pulse Rate:  [80-112] 80 (12/22 0600) Resp:  [15-26] 20 (12/22 0600) BP: (104-135)/(56-69) 115/63 (12/22 0600) SpO2:  [95 %-100 %] 98 % (12/22 0600) Weight:  [49.4 kg (108 lb 14.5 oz)-52.6 kg (115 lb 15.4 oz)] 52.6 kg (115 lb 15.4 oz) (12/22 0520) Weight change: -3.671 kg (-8 lb 1.5 oz) Last BM Date: 05/22/16  Intake/Output from previous day: 12/21 0701 - 12/22 0700 In: 2370.8 [P.O.:240; I.V.:2130.8] Out: 300 [Urine:300]  PHYSICAL EXAM General appearance: alert and no distress Resp: diminished breath sounds bilaterally and rhonchi bilaterally Cardio: S1, S2 normal GI: soft, non-tender; bowel sounds normal; no masses,  no organomegaly Extremities: extremities normal, atraumatic, no cyanosis or edema  Lab Results:  Results for orders placed or performed during the hospital encounter of 05/21/16 (from the past 48 hour(s))  Comprehensive metabolic panel     Status: Abnormal   Collection Time: 05/21/16 11:45 AM  Result Value Ref Range   Sodium 115 (LL) 135 - 145 mmol/L    Comment: CRITICAL RESULT CALLED TO, READ BACK BY AND VERIFIED WITH: SWANSON,J AT 12:25PM ON 05/21/16 BY FESTERMAN,C    Potassium 3.2 (L) 3.5 - 5.1 mmol/L   Chloride 72 (L) 101 - 111 mmol/L   CO2 32 22 - 32 mmol/L   Glucose, Bld 88 65 - 99 mg/dL   BUN 8 6 - 20 mg/dL   Creatinine, Ser 0.59 (L) 0.61 - 1.24 mg/dL   Calcium 8.7 (L) 8.9 - 10.3 mg/dL   Total Protein 7.3 6.5 - 8.1 g/dL   Albumin 3.2 (L) 3.5 - 5.0 g/dL   AST 68 (H) 15 - 41 U/L   ALT 32 17 - 63 U/L   Alkaline Phosphatase 78 38 - 126 U/L   Total Bilirubin 1.7 (H) 0.3 - 1.2 mg/dL   GFR calc non Af Amer >60 >60 mL/min   GFR calc Af Amer >60 >60 mL/min    Comment: (NOTE) The eGFR has been calculated using the CKD EPI  equation. This calculation has not been validated in all clinical situations. eGFR's persistently <60 mL/min signify possible Chronic Kidney Disease.    Anion gap 11 5 - 15  Lipase, blood     Status: None   Collection Time: 05/21/16 11:45 AM  Result Value Ref Range   Lipase 20 11 - 51 U/L  CBC with Differential/Platelet     Status: Abnormal   Collection Time: 05/21/16 11:45 AM  Result Value Ref Range   WBC 4.0 4.0 - 10.5 K/uL   RBC 3.88 (L) 4.22 - 5.81 MIL/uL   Hemoglobin 14.0 13.0 - 17.0 g/dL   HCT 37.1 (L) 39.0 - 52.0 %   MCV 95.6 78.0 - 100.0 fL   MCH 36.1 (H) 26.0 - 34.0 pg   MCHC 37.5 (H) 30.0 - 36.0 g/dL    Comment: RULED OUT INTERFERING SUBSTANCES   RDW 12.7 11.5 - 15.5 %   Platelets 144 (L) 150 - 400 K/uL   Neutrophils Relative % 70 %   Neutro Abs 2.8 1.7 - 7.7 K/uL   Lymphocytes Relative 20 %   Lymphs Abs 0.8 0.7 - 4.0 K/uL   Monocytes Relative 10 %   Monocytes Absolute 0.4 0.1 - 1.0 K/uL   Eosinophils Relative 1 %  Eosinophils Absolute 0.0 0.0 - 0.7 K/uL   Basophils Relative 0 %   Basophils Absolute 0.0 0.0 - 0.1 K/uL  Urinalysis, Routine w reflex microscopic     Status: Abnormal   Collection Time: 05/21/16 11:45 AM  Result Value Ref Range   Color, Urine AMBER (A) YELLOW    Comment: BIOCHEMICALS MAY BE AFFECTED BY COLOR   APPearance CLEAR CLEAR   Specific Gravity, Urine <1.005 (L) 1.005 - 1.030   pH 6.0 5.0 - 8.0   Glucose, UA NEGATIVE NEGATIVE mg/dL   Hgb urine dipstick NEGATIVE NEGATIVE   Bilirubin Urine NEGATIVE NEGATIVE   Ketones, ur NEGATIVE NEGATIVE mg/dL   Protein, ur NEGATIVE NEGATIVE mg/dL   Nitrite NEGATIVE NEGATIVE   Leukocytes, UA NEGATIVE NEGATIVE    Comment: Microscopic not done on urines with negative protein, blood, leukocytes, nitrite, or glucose < 500 mg/dL.  Ethanol     Status: Abnormal   Collection Time: 05/21/16 11:45 AM  Result Value Ref Range   Alcohol, Ethyl (B) 45 (H) <5 mg/dL    Comment:        LOWEST DETECTABLE LIMIT  FOR SERUM ALCOHOL IS 5 mg/dL FOR MEDICAL PURPOSES ONLY   Rapid urine drug screen (hospital performed)     Status: None   Collection Time: 05/21/16 11:45 AM  Result Value Ref Range   Opiates NONE DETECTED NONE DETECTED   Cocaine NONE DETECTED NONE DETECTED   Benzodiazepines NONE DETECTED NONE DETECTED   Amphetamines NONE DETECTED NONE DETECTED   Tetrahydrocannabinol NONE DETECTED NONE DETECTED   Barbiturates NONE DETECTED NONE DETECTED    Comment:        DRUG SCREEN FOR MEDICAL PURPOSES ONLY.  IF CONFIRMATION IS NEEDED FOR ANY PURPOSE, NOTIFY LAB WITHIN 5 DAYS.        LOWEST DETECTABLE LIMITS FOR URINE DRUG SCREEN Drug Class       Cutoff (ng/mL) Amphetamine      1000 Barbiturate      200 Benzodiazepine   335 Tricyclics       456 Opiates          300 Cocaine          300 THC              50   Protime-INR     Status: None   Collection Time: 05/21/16 11:45 AM  Result Value Ref Range   Prothrombin Time 12.2 11.4 - 15.2 seconds   INR 0.91   Ammonia     Status: Abnormal   Collection Time: 05/21/16 11:45 AM  Result Value Ref Range   Ammonia <9 (L) 9 - 35 umol/L  Osmolality     Status: Abnormal   Collection Time: 05/21/16 11:45 AM  Result Value Ref Range   Osmolality 260 (L) 275 - 295 mOsm/kg    Comment: Performed at [facility] metabolic panel     Status: Abnormal   Collection Time: 05/21/16  5:55 PM  Result Value Ref Range   Sodium 119 (LL) 135 - 145 mmol/L    Comment: CRITICAL RESULT CALLED TO, READ BACK BY AND VERIFIED WITH: VOGLER,T ON 05/21/16 AT 1845 BY LOY,C    Potassium 3.2 (L) 3.5 - 5.1 mmol/L   Chloride 82 (L) 101 - 111 mmol/L   CO2 27 22 - 32 mmol/L   Glucose, Bld 91 65 - 99 mg/dL   BUN 6 6 - 20 mg/dL   Creatinine, Ser 0.56 (L) 0.61 - 1.24 mg/dL  Calcium 8.4 (L) 8.9 - 10.3 mg/dL   GFR calc non Af Amer >60 >60 mL/min   GFR calc Af Amer >60 >60 mL/min    Comment: (NOTE) The eGFR has been calculated using the CKD EPI equation. This  calculation has not been validated in all clinical situations. eGFR's persistently <60 mL/min signify possible Chronic Kidney Disease.    Anion gap 10 5 - 15  Urine rapid drug screen (hosp performed)not at [facility]     Status: None   Collection Time: 05/21/16  8:35 PM  Result Value Ref Range   Opiates NONE DETECTED NONE DETECTED   Cocaine NONE DETECTED NONE DETECTED   Benzodiazepines NONE DETECTED NONE DETECTED   Amphetamines NONE DETECTED NONE DETECTED   Tetrahydrocannabinol NONE DETECTED NONE DETECTED   Barbiturates NONE DETECTED NONE DETECTED    Comment:        DRUG SCREEN FOR MEDICAL PURPOSES ONLY.  IF CONFIRMATION IS NEEDED FOR ANY PURPOSE, NOTIFY LAB WITHIN 5 DAYS.        LOWEST DETECTABLE LIMITS FOR URINE DRUG SCREEN Drug Class       Cutoff (ng/mL) Amphetamine      1000 Barbiturate      200 Benzodiazepine   200 Tricyclics       300 Opiates          300 Cocaine          300 THC              50   Magnesium     Status: Abnormal   Collection Time: 05/21/16  8:42 PM  Result Value Ref Range   Magnesium 1.4 (L) 1.7 - 2.4 mg/dL  Phosphorus     Status: Abnormal   Collection Time: 05/21/16  8:42 PM  Result Value Ref Range   Phosphorus 2.2 (L) 2.5 - 4.6 mg/dL  Protime-INR     Status: None   Collection Time: 05/21/16  8:42 PM  Result Value Ref Range   Prothrombin Time 12.6 11.4 - 15.2 seconds   INR 0.95   Basic metabolic panel     Status: Abnormal   Collection Time: 05/21/16  8:43 PM  Result Value Ref Range   Sodium 121 (L) 135 - 145 mmol/L   Potassium 3.5 3.5 - 5.1 mmol/L   Chloride 83 (L) 101 - 111 mmol/L   CO2 28 22 - 32 mmol/L   Glucose, Bld 86 65 - 99 mg/dL   BUN 7 6 - 20 mg/dL   Creatinine, Ser 2.75 0.61 - 1.24 mg/dL   Calcium 8.5 (L) 8.9 - 10.3 mg/dL   GFR calc non Af Amer >60 >60 mL/min   GFR calc Af Amer >60 >60 mL/min    Comment: (NOTE) The eGFR has been calculated using the CKD EPI equation. This calculation has not been validated in all clinical  situations. eGFR's persistently <60 mL/min signify possible Chronic Kidney Disease.    Anion gap 10 5 - 15  Osmolality, urine     Status: Abnormal   Collection Time: 05/21/16 11:43 PM  Result Value Ref Range   Osmolality, Ur 293 (L) 300 - 900 mOsm/kg    Comment: Performed at [facility]  Na and K (sodium & potassium), rand urine     Status: None   Collection Time: 05/21/16 11:45 PM  Result Value Ref Range   Sodium, Ur <10 mmol/L   Potassium Urine 29 mmol/L  Basic metabolic panel     Status: Abnormal   Collection  Time: 05/22/16  1:03 AM  Result Value Ref Range   Sodium 122 (L) 135 - 145 mmol/L   Potassium 3.1 (L) 3.5 - 5.1 mmol/L   Chloride 86 (L) 101 - 111 mmol/L   CO2 27 22 - 32 mmol/L   Glucose, Bld 83 65 - 99 mg/dL   BUN 7 6 - 20 mg/dL   Creatinine, Ser 0.64 0.61 - 1.24 mg/dL   Calcium 8.2 (L) 8.9 - 10.3 mg/dL   GFR calc non Af Amer >60 >60 mL/min   GFR calc Af Amer >60 >60 mL/min    Comment: (NOTE) The eGFR has been calculated using the CKD EPI equation. This calculation has not been validated in all clinical situations. eGFR's persistently <60 mL/min signify possible Chronic Kidney Disease.    Anion gap 9 5 - 15  Basic metabolic panel     Status: Abnormal   Collection Time: 05/22/16  5:11 AM  Result Value Ref Range   Sodium 123 (L) 135 - 145 mmol/L   Potassium 3.6 3.5 - 5.1 mmol/L   Chloride 87 (L) 101 - 111 mmol/L   CO2 26 22 - 32 mmol/L   Glucose, Bld 76 65 - 99 mg/dL   BUN 8 6 - 20 mg/dL   Creatinine, Ser 0.67 0.61 - 1.24 mg/dL   Calcium 8.5 (L) 8.9 - 10.3 mg/dL   GFR calc non Af Amer >60 >60 mL/min   GFR calc Af Amer >60 >60 mL/min    Comment: (NOTE) The eGFR has been calculated using the CKD EPI equation. This calculation has not been validated in all clinical situations. eGFR's persistently <60 mL/min signify possible Chronic Kidney Disease.    Anion gap 10 5 - 15  MRSA PCR Screening     Status: None   Collection Time: 05/22/16  8:44 AM   Result Value Ref Range   MRSA by PCR NEGATIVE NEGATIVE    Comment:        The GeneXpert MRSA Assay (FDA approved for NASAL specimens only), is one component of a comprehensive MRSA colonization surveillance program. It is not intended to diagnose MRSA infection nor to guide or monitor treatment for MRSA infections.   Basic metabolic panel     Status: Abnormal   Collection Time: 05/22/16  9:31 AM  Result Value Ref Range   Sodium 124 (L) 135 - 145 mmol/L   Potassium 3.4 (L) 3.5 - 5.1 mmol/L   Chloride 88 (L) 101 - 111 mmol/L   CO2 28 22 - 32 mmol/L   Glucose, Bld 108 (H) 65 - 99 mg/dL   BUN 9 6 - 20 mg/dL   Creatinine, Ser 0.73 0.61 - 1.24 mg/dL   Calcium 8.6 (L) 8.9 - 10.3 mg/dL   GFR calc non Af Amer >60 >60 mL/min   GFR calc Af Amer >60 >60 mL/min    Comment: (NOTE) The eGFR has been calculated using the CKD EPI equation. This calculation has not been validated in all clinical situations. eGFR's persistently <60 mL/min signify possible Chronic Kidney Disease.    Anion gap 8 5 - 15    ABGS No results for input(s): PHART, PO2ART, TCO2, HCO3 in the last 72 hours.  Invalid input(s): PCO2 CULTURES Recent Results (from the past 240 hour(s))  MRSA PCR Screening     Status: None   Collection Time: 05/22/16  8:44 AM  Result Value Ref Range Status   MRSA by PCR NEGATIVE NEGATIVE Final    Comment:  The GeneXpert MRSA Assay (FDA approved for NASAL specimens only), is one component of a comprehensive MRSA colonization surveillance program. It is not intended to diagnose MRSA infection nor to guide or monitor treatment for MRSA infections.    Studies/Results: Dg Chest 2 View  Result Date: 05/21/2016 CLINICAL DATA:  Altered mental status EXAM: CHEST  2 VIEW COMPARISON:  02/29/2016 FINDINGS: Right middle lobe collapse unchanged. Elevated right hemidiaphragm unchanged. Left lung remains clear. Heart size and vascularity normal. Pulmonary hyperinflation.  IMPRESSION: Right middle lobe collapse unchanged. Electronically Signed   By: Franchot Gallo M.D.   On: 05/21/2016 13:43   Ct Head Wo Contrast  Result Date: 05/21/2016 CLINICAL DATA:  Mild vascular calcification noted.  There are foci EXAM: CT HEAD WITHOUT CONTRAST TECHNIQUE: Contiguous axial images were obtained from the base of the skull through the vertex without intravenous contrast. COMPARISON:  None. FINDINGS: Brain: There is mild diffuse atrophy. There is no intracranial mass, hemorrhage, extra-axial fluid collection, or midline shift. There is decreased attenuation in the inferior right basal ganglia region involving the inferior aspect of the right globus pallidum and putamen on the right as well as portions of the claustrum and extreme capsule on the right. This finding is best appreciated on axial slice 14 series 2. No other findings concerning for potential acute infarct elsewhere noted. Vascular: There is no hyperdense vessel. There is calcification in both carotid siphon regions. Skull: Bones are osteoporotic. No focal bone lesions are evident in the calvarium. Sinuses/Orbits: There is mucosal thickening in several ethmoid air cell regions bilaterally. Visualized paranasal sinuses elsewhere clear. There is leftward deviation of the nasal septum. Orbits appear symmetric bilaterally. Other: Mastoid air cells are clear. IMPRESSION: Decreased attenuation in the inferior somewhat posterior aspects of the right basal is suspicious for acute infarction in this area. Elsewhere there is mild atrophy with patchy periventricular small vessel disease. No hemorrhage. No well-defined mass or extra-axial fluid collection. Foci of arterial vascular calcification noted. Ethmoid sinus disease noted bilaterally. Leftward deviation of nasal septum. Electronically Signed   By: Lowella Grip III M.D.   On: 05/21/2016 13:36   Mr Brain Wo Contrast (neuro Protocol)  Result Date: 05/21/2016 CLINICAL DATA:  Altered  mental status.  Rule out CVA EXAM: MRI HEAD WITHOUT CONTRAST TECHNIQUE: Multiplanar, multiecho pulse sequences of the brain and surrounding structures were obtained without intravenous contrast. COMPARISON:  CT head 05/21/2016 FINDINGS: Brain: Image quality degraded by mild motion. Negative for acute infarct. Mild chronic microvascular ischemic change in the white matter. Mild cerebral atrophy. Negative for hydrocephalus. Negative for hemorrhage or mass. No shift of the midline structures. Vascular: Normal arterial flow voids. Skull and upper cervical spine: Negative Sinuses/Orbits: Mucosal edema paranasal sinuses.  Normal orbit. Other: None IMPRESSION: Atrophy and chronic microvascular ischemic change. No acute abnormality. Negative for acute infarct. Electronically Signed   By: Franchot Gallo M.D.   On: 05/21/2016 15:17    Medications: I have reviewed the patient's current medications.  Assesment:  Principal Problem:   Hyponatremia Active Problems:   Lung mass   Hypokalemia   Mild malnutrition (HCC)   Thrombocytopenia (HCC)   Seizure (HCC)   Alcohol dependence (HCC)   Tobacco dependence   Malnutrition of moderate degree    Plan:  Medications reviewed Continue IV fluid Neurology consult pending BMP in AM   LOS: 2 days   Vee Bahe 05/23/2016, 8:26 AM

## 2016-05-24 LAB — BASIC METABOLIC PANEL
Anion gap: 5 (ref 5–15)
BUN: 6 mg/dL (ref 6–20)
CALCIUM: 8.3 mg/dL — AB (ref 8.9–10.3)
CO2: 32 mmol/L (ref 22–32)
CREATININE: 0.61 mg/dL (ref 0.61–1.24)
Chloride: 90 mmol/L — ABNORMAL LOW (ref 101–111)
Glucose, Bld: 98 mg/dL (ref 65–99)
Potassium: 3.1 mmol/L — ABNORMAL LOW (ref 3.5–5.1)
SODIUM: 127 mmol/L — AB (ref 135–145)

## 2016-05-24 LAB — RPR: RPR: NONREACTIVE

## 2016-05-24 LAB — HIV ANTIBODY (ROUTINE TESTING W REFLEX): HIV SCREEN 4TH GENERATION: NONREACTIVE

## 2016-05-24 MED ORDER — KCL IN DEXTROSE-NACL 20-5-0.9 MEQ/L-%-% IV SOLN
INTRAVENOUS | Status: DC
  Administered 2016-05-24 – 2016-05-26 (×4): via INTRAVENOUS

## 2016-05-24 NOTE — Progress Notes (Signed)
Pt is sleeping at this time.

## 2016-05-24 NOTE — Progress Notes (Signed)
Subjective: Patient has developed signs of DT. He is on ativan and sedated. No seizure since admission. EEG done but result pending  Objective: Vital signs in last 24 hours: Temp:  [98.1 F (36.7 C)-99.5 F (37.5 C)] 98.1 F (36.7 C) (12/23 0735) Pulse Rate:  [77-80] 78 (12/23 0800) Resp:  [22-27] 24 (12/23 0800) BP: (118-138)/(54-113) 137/113 (12/23 0800) SpO2:  [95 %-98 %] 95 % (12/23 0800) Weight:  [52.1 kg (114 lb 13.8 oz)] 52.1 kg (114 lb 13.8 oz) (12/23 0458) Weight change: 2.7 kg (5 lb 15.2 oz) Last BM Date: 05/23/16  Intake/Output from previous day: 12/22 0701 - 12/23 0700 In: 600 [I.V.:600] Out: 550 [Urine:550]  PHYSICAL EXAM General appearance: alert and no distress Resp: diminished breath sounds bilaterally and rhonchi bilaterally Cardio: S1, S2 normal GI: soft, non-tender; bowel sounds normal; no masses,  no organomegaly Extremities: extremities normal, atraumatic, no cyanosis or edema  Lab Results:  Results for orders placed or performed during the hospital encounter of 05/21/16 (from the past 48 hour(s))  RPR     Status: None   Collection Time: 05/23/16  1:38 PM  Result Value Ref Range   RPR Ser Ql Non Reactive Non Reactive    Comment: (NOTE) Performed At: [facility] [place], [place] [id number] Lindon Romp MD [id number]   Vitamin B12     Status: None   Collection Time: 05/23/16  1:38 PM  Result Value Ref Range   Vitamin B-12 837 180 - 914 pg/mL    Comment: (NOTE) This assay is not validated for testing neonatal or myeloproliferative syndrome specimens for Vitamin B12 levels. Performed at [facility]   TSH     Status: None   Collection Time: 05/23/16  1:38 PM  Result Value Ref Range   TSH 2.694 0.350 - 4.500 uIU/mL    Comment: Performed by a 3rd Generation assay with a functional sensitivity of <=0.01 uIU/mL.  HIV antibody (routine testing) (NOT for [facility])     Status: None   Collection Time: 05/23/16  1:38 PM   Result Value Ref Range   HIV Screen 4th Generation wRfx Non Reactive Non Reactive    Comment: (NOTE) Performed At: [facility] [place], [place] [id number] Lindon Romp MD [id number]   Basic metabolic panel     Status: Abnormal   Collection Time: 05/24/16 12:31 AM  Result Value Ref Range   Sodium 127 (L) 135 - 145 mmol/L   Potassium 3.1 (L) 3.5 - 5.1 mmol/L   Chloride 90 (L) 101 - 111 mmol/L   CO2 32 22 - 32 mmol/L   Glucose, Bld 98 65 - 99 mg/dL   BUN 6 6 - 20 mg/dL   Creatinine, Ser 0.61 0.61 - 1.24 mg/dL   Calcium 8.3 (L) 8.9 - 10.3 mg/dL   GFR calc non Af Amer >60 >60 mL/min   GFR calc Af Amer >60 >60 mL/min    Comment: (NOTE) The eGFR has been calculated using the CKD EPI equation. This calculation has not been validated in all clinical situations. eGFR's persistently <60 mL/min signify possible Chronic Kidney Disease.    Anion gap 5 5 - 15    ABGS No results for input(s): PHART, PO2ART, TCO2, HCO3 in the last 72 hours.  Invalid input(s): PCO2 CULTURES Recent Results (from the past 240 hour(s))  MRSA PCR Screening     Status: None   Collection Time: 05/22/16  8:44 AM  Result Value Ref Range  Status   MRSA by PCR NEGATIVE NEGATIVE Final    Comment:        The GeneXpert MRSA Assay (FDA approved for NASAL specimens only), is one component of a comprehensive MRSA colonization surveillance program. It is not intended to diagnose MRSA infection nor to guide or monitor treatment for MRSA infections.    Studies/Results: No results found.  Medications: I have reviewed the patient's current medications.  Assesment:  Principal Problem:   Hyponatremia Active Problems:   Lung mass   Hypokalemia   Mild malnutrition (HCC)   Thrombocytopenia (HCC)   Seizure (HCC)   Alcohol dependence (HCC)   Tobacco dependence   Malnutrition of moderate degree Delirium  Tremens  hypokalemia   Plan:  Medications reviewed Will adjust iv  fluid Will supplement K+ DT treatment ac cording protocol Neurology consult appreciated   LOS: 3 days   Jeyli Zwicker 05/24/2016, 10:13 AM

## 2016-05-25 LAB — BASIC METABOLIC PANEL
ANION GAP: 8 (ref 5–15)
BUN: 3 mg/dL — ABNORMAL LOW (ref 6–20)
CALCIUM: 8.5 mg/dL — AB (ref 8.9–10.3)
CO2: 29 mmol/L (ref 22–32)
Chloride: 90 mmol/L — ABNORMAL LOW (ref 101–111)
Creatinine, Ser: 0.55 mg/dL — ABNORMAL LOW (ref 0.61–1.24)
Glucose, Bld: 101 mg/dL — ABNORMAL HIGH (ref 65–99)
POTASSIUM: 3 mmol/L — AB (ref 3.5–5.1)
Sodium: 127 mmol/L — ABNORMAL LOW (ref 135–145)

## 2016-05-25 MED ORDER — POTASSIUM CHLORIDE 10 MEQ/100ML IV SOLN
10.0000 meq | INTRAVENOUS | Status: AC
  Administered 2016-05-25 (×3): 10 meq via INTRAVENOUS
  Filled 2016-05-25 (×3): qty 100

## 2016-05-25 NOTE — Progress Notes (Signed)
Subjective: Patient more awake today. He is not agitated. His K+and Na+ are still low. He is receiving supplements.  Objective: Vital signs in last 24 hours: Temp:  [97.7 F (36.5 C)-99.1 F (37.3 C)] 98 F (36.7 C) (12/24 0400) Pulse Rate:  [75-88] 76 (12/23 1800) Resp:  [16-31] 27 (12/23 1800) BP: (119-159)/(53-133) 141/65 (12/23 1800) SpO2:  [92 %-98 %] 97 % (12/23 1800) Weight:  [51 kg (112 lb 7 oz)] 51 kg (112 lb 7 oz) (12/24 0500) Weight change: -1.1 kg (-2 lb 6.8 oz) Last BM Date: 05/23/16  Intake/Output from previous day: 12/23 0701 - 12/24 0700 In: 865 [P.O.:240; I.V.:625] Out: 2850 [Urine:2850]  PHYSICAL EXAM General appearance: alert and no distress Resp: diminished breath sounds bilaterally and rhonchi bilaterally Cardio: S1, S2 normal GI: soft, non-tender; bowel sounds normal; no masses,  no organomegaly Extremities: extremities normal, atraumatic, no cyanosis or edema  Lab Results:  Results for orders placed or performed during the hospital encounter of 05/21/16 (from the past 48 hour(s))  RPR     Status: None   Collection Time: 05/23/16  1:38 PM  Result Value Ref Range   RPR Ser Ql Non Reactive Non Reactive    Comment: (NOTE) Performed At: [facility] [place], [place] [id number] Lindon Romp MD [id number]   Vitamin B12     Status: None   Collection Time: 05/23/16  1:38 PM  Result Value Ref Range   Vitamin B-12 837 180 - 914 pg/mL    Comment: (NOTE) This assay is not validated for testing neonatal or myeloproliferative syndrome specimens for Vitamin B12 levels. Performed at [facility]   TSH     Status: None   Collection Time: 05/23/16  1:38 PM  Result Value Ref Range   TSH 2.694 0.350 - 4.500 uIU/mL    Comment: Performed by a 3rd Generation assay with a functional sensitivity of <=0.01 uIU/mL.  HIV antibody (routine testing) (NOT for [facility])     Status: None   Collection Time: 05/23/16  1:38 PM  Result Value  Ref Range   HIV Screen 4th Generation wRfx Non Reactive Non Reactive    Comment: (NOTE) Performed At: [facility] [place], [place] [id number] Lindon Romp MD OE:[id number]   Basic metabolic panel     Status: Abnormal   Collection Time: 05/24/16 12:31 AM  Result Value Ref Range   Sodium 127 (L) 135 - 145 mmol/L   Potassium 3.1 (L) 3.5 - 5.1 mmol/L   Chloride 90 (L) 101 - 111 mmol/L   CO2 32 22 - 32 mmol/L   Glucose, Bld 98 65 - 99 mg/dL   BUN 6 6 - 20 mg/dL   Creatinine, Ser 0.61 0.61 - 1.24 mg/dL   Calcium 8.3 (L) 8.9 - 10.3 mg/dL   GFR calc non Af Amer >60 >60 mL/min   GFR calc Af Amer >60 >60 mL/min    Comment: (NOTE) The eGFR has been calculated using the CKD EPI equation. This calculation has not been validated in all clinical situations. eGFR's persistently <60 mL/min signify possible Chronic Kidney Disease.    Anion gap 5 5 - 15  Basic metabolic panel     Status: Abnormal   Collection Time: 05/25/16  1:01 AM  Result Value Ref Range   Sodium 127 (L) 135 - 145 mmol/L   Potassium 3.0 (L) 3.5 - 5.1 mmol/L   Chloride 90 (L) 101 - 111 mmol/L   CO2 29  22 - 32 mmol/L   Glucose, Bld 101 (H) 65 - 99 mg/dL   BUN 3 (L) 6 - 20 mg/dL    Comment: DELTA CHECK NOTED   Creatinine, Ser 0.55 (L) 0.61 - 1.24 mg/dL   Calcium 8.5 (L) 8.9 - 10.3 mg/dL   GFR calc non Af Amer >60 >60 mL/min   GFR calc Af Amer >60 >60 mL/min    Comment: (NOTE) The eGFR has been calculated using the CKD EPI equation. This calculation has not been validated in all clinical situations. eGFR's persistently <60 mL/min signify possible Chronic Kidney Disease.    Anion gap 8 5 - 15    ABGS No results for input(s): PHART, PO2ART, TCO2, HCO3 in the last 72 hours.  Invalid input(s): PCO2 CULTURES Recent Results (from the past 240 hour(s))  MRSA PCR Screening     Status: None   Collection Time: 05/22/16  8:44 AM  Result Value Ref Range Status   MRSA by PCR NEGATIVE NEGATIVE  Final    Comment:        The GeneXpert MRSA Assay (FDA approved for NASAL specimens only), is one component of a comprehensive MRSA colonization surveillance program. It is not intended to diagnose MRSA infection nor to guide or monitor treatment for MRSA infections.    Studies/Results: No results found.  Medications: I have reviewed the patient's current medications.  Assesment:  Principal Problem:   Hyponatremia Active Problems:   Lung mass   Hypokalemia   Mild malnutrition (HCC)   Thrombocytopenia (HCC)   Seizure (HCC)   Alcohol dependence (HCC)   Tobacco dependence   Malnutrition of moderate degree Delirium  Tremens  hypokalemia   Plan:  Medications reviewed Will continue iv fluid K+ supplement DT treatment according to protocol   LOS: 4 days   Cash Duce 05/25/2016, 6:48 AM

## 2016-05-26 LAB — BASIC METABOLIC PANEL
ANION GAP: 7 (ref 5–15)
BUN: 4 mg/dL — ABNORMAL LOW (ref 6–20)
CALCIUM: 8.4 mg/dL — AB (ref 8.9–10.3)
CO2: 27 mmol/L (ref 22–32)
CREATININE: 0.65 mg/dL (ref 0.61–1.24)
Chloride: 88 mmol/L — ABNORMAL LOW (ref 101–111)
Glucose, Bld: 115 mg/dL — ABNORMAL HIGH (ref 65–99)
Potassium: 3.5 mmol/L (ref 3.5–5.1)
SODIUM: 122 mmol/L — AB (ref 135–145)

## 2016-05-26 MED ORDER — DEXTROSE-NACL 5-0.9 % IV SOLN
INTRAVENOUS | Status: DC
  Administered 2016-05-26 – 2016-05-28 (×5): via INTRAVENOUS

## 2016-05-26 MED ORDER — POTASSIUM CHLORIDE CRYS ER 20 MEQ PO TBCR
40.0000 meq | EXTENDED_RELEASE_TABLET | Freq: Two times a day (BID) | ORAL | Status: AC
  Administered 2016-05-26 (×2): 40 meq via ORAL
  Filled 2016-05-26 (×2): qty 2

## 2016-05-26 NOTE — Progress Notes (Signed)
Subjective: Patient awake but his po intake remained poor. His K+ is uimproving but Na+ is low..  Objective: Vital signs in last 24 hours: Temp:  [97.9 F (36.6 C)-100.2 F (37.9 C)] 98.3 F (36.8 C) (12/25 1115) Pulse Rate:  [79-120] 85 (12/25 1115) Resp:  [20-32] 31 (12/25 1115) BP: (98-130)/(45-99) 104/58 (12/25 0900) SpO2:  [93 %-98 %] 97 % (12/25 1115) Weight:  [51.2 kg (112 lb 14 oz)] 51.2 kg (112 lb 14 oz) (12/25 0400) Weight change: 0.2 kg (7.1 oz) Last BM Date: 05/23/16  Intake/Output from previous day: 12/24 0701 - 12/25 0700 In: 578 [P.O.:240; I.V.:675] Out: 2800 [Urine:2800]  PHYSICAL EXAM General appearance: alert and no distress Resp: diminished breath sounds bilaterally and rhonchi bilaterally Cardio: S1, S2 normal GI: soft, non-tender; bowel sounds normal; no masses,  no organomegaly Extremities: extremities normal, atraumatic, no cyanosis or edema  Lab Results:  Results for orders placed or performed during the hospital encounter of 05/21/16 (from the past 48 hour(s))  Basic metabolic panel     Status: Abnormal   Collection Time: 05/25/16  1:01 AM  Result Value Ref Range   Sodium 127 (L) 135 - 145 mmol/L   Potassium 3.0 (L) 3.5 - 5.1 mmol/L   Chloride 90 (L) 101 - 111 mmol/L   CO2 29 22 - 32 mmol/L   Glucose, Bld 101 (H) 65 - 99 mg/dL   BUN 3 (L) 6 - 20 mg/dL    Comment: DELTA CHECK NOTED   Creatinine, Ser 0.55 (L) 0.61 - 1.24 mg/dL   Calcium 8.5 (L) 8.9 - 10.3 mg/dL   GFR calc non Af Amer >60 >60 mL/min   GFR calc Af Amer >60 >60 mL/min    Comment: (NOTE) The eGFR has been calculated using the CKD EPI equation. This calculation has not been validated in all clinical situations. eGFR's persistently <60 mL/min signify possible Chronic Kidney Disease.    Anion gap 8 5 - 15  Basic metabolic panel     Status: Abnormal   Collection Time: 05/26/16 12:07 AM  Result Value Ref Range   Sodium 122 (L) 135 - 145 mmol/L   Potassium 3.5 3.5 - 5.1 mmol/L   Chloride 88 (L) 101 - 111 mmol/L   CO2 27 22 - 32 mmol/L   Glucose, Bld 115 (H) 65 - 99 mg/dL   BUN 4 (L) 6 - 20 mg/dL    Comment: DELTA CHECK NOTED   Creatinine, Ser 0.65 0.61 - 1.24 mg/dL   Calcium 8.4 (L) 8.9 - 10.3 mg/dL   GFR calc non Af Amer >60 >60 mL/min   GFR calc Af Amer >60 >60 mL/min    Comment: (NOTE) The eGFR has been calculated using the CKD EPI equation. This calculation has not been validated in all clinical situations. eGFR's persistently <60 mL/min signify possible Chronic Kidney Disease.    Anion gap 7 5 - 15    ABGS No results for input(s): PHART, PO2ART, TCO2, HCO3 in the last 72 hours.  Invalid input(s): PCO2 CULTURES Recent Results (from the past 240 hour(s))  MRSA PCR Screening     Status: None   Collection Time: 05/22/16  8:44 AM  Result Value Ref Range Status   MRSA by PCR NEGATIVE NEGATIVE Final    Comment:        The GeneXpert MRSA Assay (FDA approved for NASAL specimens only), is one component of a comprehensive MRSA colonization surveillance program. It is not intended to diagnose MRSA infection nor to  guide or monitor treatment for MRSA infections.    Studies/Results: No results found.  Medications: I have reviewed the patient's current medications.  Assesment:  Principal Problem:   Hyponatremia Active Problems:   Lung mass   Hypokalemia   Mild malnutrition (HCC)   Thrombocytopenia (HCC)   Seizure (HCC)   Alcohol dependence (HCC)   Tobacco dependence   Malnutrition of moderate degree Delirium  Tremens  hypokalemia   Plan:  Medications reviewed Will continue iv fluid K+ supplement DT treatment according to protocol   LOS: 5 days   Galileah Piggee 05/26/2016, 11:32 AM

## 2016-05-26 NOTE — Progress Notes (Signed)
Patient has been more alert today. Consumed most of his dinner tonight and drunk half of his boost from lunch. Ativan seems to be working for patient's tremors.  He states he is just sleepy. Was able to remove mitts for a short period of time today, and then replaced for patient safety and IV safety.   Margaret Pyle, RN

## 2016-05-27 LAB — BASIC METABOLIC PANEL
ANION GAP: 5 (ref 5–15)
BUN: <5 mg/dL — ABNORMAL LOW (ref 6–20)
CALCIUM: 8.3 mg/dL — AB (ref 8.9–10.3)
CHLORIDE: 96 mmol/L — AB (ref 101–111)
CO2: 28 mmol/L (ref 22–32)
Creatinine, Ser: 0.58 mg/dL — ABNORMAL LOW (ref 0.61–1.24)
GFR calc non Af Amer: >60 mL/min (ref >60)
Glucose, Bld: 102 mg/dL — ABNORMAL HIGH (ref 65–99)
Potassium: 4 mmol/L (ref 3.5–5.1)
SODIUM: 129 mmol/L — AB (ref 135–145)

## 2016-05-27 LAB — HOMOCYSTEINE: Homocysteine: 11 umol/L (ref 0.0–15.0)

## 2016-05-27 MED ORDER — POLYETHYLENE GLYCOL 3350 17 G PO PACK
17.0000 g | PACK | Freq: Every day | ORAL | Status: DC
  Administered 2016-05-27 – 2016-05-28 (×2): 17 g via ORAL
  Filled 2016-05-27 (×2): qty 1

## 2016-05-27 NOTE — Procedures (Signed)
  [place] A. Merlene Laughter, MD     [email or web address]           HISTORY: 55 YO with new onset SZ.  MEDICATIONS: Scheduled Meds: . amLODipine  5 mg Oral Daily  . enoxaparin (LOVENOX) injection  40 mg Subcutaneous Q24H  . feeding supplement (ENSURE ENLIVE)  237 mL Oral BID BM  . folic acid  1 mg Oral Daily  . pantoprazole  40 mg Oral Daily  . polyethylene glycol  17 g Oral Daily  . thiamine  100 mg Intravenous Daily   Continuous Infusions: . dextrose 5 % and 0.9% NaCl 100 mL/hr at 05/27/16 1300   PRN Meds:.acetaminophen **OR** acetaminophen, LORazepam, LORazepam, ondansetron **OR** ondansetron (ZOFRAN) IV  Prior to Admission medications   Medication Sig Start Date End Date Taking? Authorizing Provider  amLODipine (NORVASC) 5 MG tablet Take 5 mg by mouth daily.   Yes Historical Provider, MD  amoxicillin (AMOXIL) 500 MG tablet Take 500 mg by mouth 3 (three) times daily.   Yes Historical Provider, MD  omeprazole (PRILOSEC) 20 MG capsule Take 20 mg by mouth daily.   Yes Historical Provider, MD      ANALYSIS: A 16 channel recording using standard 10 20 measurements is conducted for 20 minutes. There is a dominant posterior rhythm of 9 Hz which attenuates with eye opening. There is beta activity seen over the frontal fields. Awake and drowsy activities are seen. Photic stimulation did not elicit any abnormal responses. Hyperventilation was not conducted. There is no focal slowing, lateralized slowing or epileptiform activity.   IMPRESSION: This is a normal study of the awake and drowsy states.      Mihaela Fajardo A. Merlene Laughter, M.D.  Diplomate, Tax adviser of Psychiatry and Neurology ( Neurology).

## 2016-05-27 NOTE — Progress Notes (Signed)
Subjective: Patient is more alert and responding to verbal communications. He has more tremors and shaking, but no convulsion. His electrolyte is improving.  Objective: Vital signs in last 24 hours: Temp:  [97.8 F (36.6 C)-98.8 F (37.1 C)] 97.8 F (36.6 C) (12/26 0715) Pulse Rate:  [78-90] 80 (12/26 0700) Resp:  [20-34] 28 (12/26 0700) BP: (95-140)/(52-83) 137/58 (12/26 0700) SpO2:  [96 %-100 %] 99 % (12/26 0700) Weight:  [51.7 kg (113 lb 15.7 oz)] 51.7 kg (113 lb 15.7 oz) (12/26 0500) Weight change: 0.5 kg (1 lb 1.6 oz) Last BM Date: 05/23/16  Intake/Output from previous day: 12/25 0701 - 12/26 0700 In: 2146.7 [P.O.:280; I.V.:1866.7] Out: 1500 [Urine:1500]  PHYSICAL EXAM General appearance: alert and no distress Resp: diminished breath sounds bilaterally and rhonchi bilaterally Cardio: S1, S2 normal GI: soft, non-tender; bowel sounds normal; no masses,  no organomegaly Extremities: extremities normal, atraumatic, no cyanosis or edema  Lab Results:  Results for orders placed or performed during the hospital encounter of 05/21/16 (from the past 48 hour(s))  Basic metabolic panel     Status: Abnormal   Collection Time: 05/26/16 12:07 AM  Result Value Ref Range   Sodium 122 (L) 135 - 145 mmol/L   Potassium 3.5 3.5 - 5.1 mmol/L   Chloride 88 (L) 101 - 111 mmol/L   CO2 27 22 - 32 mmol/L   Glucose, Bld 115 (H) 65 - 99 mg/dL   BUN 4 (L) 6 - 20 mg/dL    Comment: DELTA CHECK NOTED   Creatinine, Ser 0.65 0.61 - 1.24 mg/dL   Calcium 8.4 (L) 8.9 - 10.3 mg/dL   GFR calc non Af Amer >60 >60 mL/min   GFR calc Af Amer >60 >60 mL/min    Comment: (NOTE) The eGFR has been calculated using the CKD EPI equation. This calculation has not been validated in all clinical situations. eGFR's persistently <60 mL/min signify possible Chronic Kidney Disease.    Anion gap 7 5 - 15  Basic metabolic panel     Status: Abnormal   Collection Time: 05/27/16  4:28 AM  Result Value Ref Range   Sodium 129 (L) 135 - 145 mmol/L    Comment: DELTA CHECK NOTED   Potassium 4.0 3.5 - 5.1 mmol/L   Chloride 96 (L) 101 - 111 mmol/L   CO2 28 22 - 32 mmol/L   Glucose, Bld 102 (H) 65 - 99 mg/dL   BUN <5 (L) 6 - 20 mg/dL   Creatinine, Ser 0.58 (L) 0.61 - 1.24 mg/dL   Calcium 8.3 (L) 8.9 - 10.3 mg/dL   GFR calc non Af Amer >60 >60 mL/min   GFR calc Af Amer >60 >60 mL/min    Comment: (NOTE) The eGFR has been calculated using the CKD EPI equation. This calculation has not been validated in all clinical situations. eGFR's persistently <60 mL/min signify possible Chronic Kidney Disease.    Anion gap 5 5 - 15    ABGS No results for input(s): PHART, PO2ART, TCO2, HCO3 in the last 72 hours.  Invalid input(s): PCO2 CULTURES Recent Results (from the past 240 hour(s))  MRSA PCR Screening     Status: None   Collection Time: 05/22/16  8:44 AM  Result Value Ref Range Status   MRSA by PCR NEGATIVE NEGATIVE Final    Comment:        The GeneXpert MRSA Assay (FDA approved for NASAL specimens only), is one component of a comprehensive MRSA colonization surveillance program. It is not  intended to diagnose MRSA infection nor to guide or monitor treatment for MRSA infections.    Studies/Results: No results found.  Medications: I have reviewed the patient's current medications.  Assesment:  Principal Problem:   Hyponatremia Active Problems:   Lung mass   Hypokalemia   Mild malnutrition (HCC)   Thrombocytopenia (HCC)   Seizure (HCC)   Alcohol dependence (HCC)   Tobacco dependence   Malnutrition of moderate degree Delirium  Tremens  hypokalemia   Plan:  Medications reviewed Will adjust iv fluid EEG result pending Continue DT treatment according to protocol Will monitor BMP   LOS: 6 days   Jesus Walker 05/27/2016, 8:05 AM

## 2016-05-27 NOTE — Care Management Note (Signed)
Case Management Note  Patient Details  Name: Jesus Walker MRN: [id number] Date of Birth: 1960/11/22  If discussed at Long Length of Stay Meetings, dates discussed:  05/27/2016   Sherald Barge, RN 05/27/2016, 10:16 AM

## 2016-05-27 NOTE — Progress Notes (Signed)
Patient has been awake and alert all day and visiting with family. Only gave 3 doses of Ativan total during day shift. Patient tremors are starting to decrease and Ativan only given when tremors were very visible.   Patient ate lunch and dinner today. He ate cookies and drunk his ensure in between meals.  Patient is still disoriented to time.   UOP is adequate Patient would like to get out of bed tomorrow.   Margaret Pyle, RN

## 2016-05-28 LAB — BASIC METABOLIC PANEL
ANION GAP: 7 (ref 5–15)
CALCIUM: 8.7 mg/dL — AB (ref 8.9–10.3)
CO2: 29 mmol/L (ref 22–32)
Chloride: 93 mmol/L — ABNORMAL LOW (ref 101–111)
Creatinine, Ser: 0.61 mg/dL (ref 0.61–1.24)
GFR calc Af Amer: >60 mL/min (ref >60)
Glucose, Bld: 105 mg/dL — ABNORMAL HIGH (ref 65–99)
POTASSIUM: 3.6 mmol/L (ref 3.5–5.1)
SODIUM: 129 mmol/L — AB (ref 135–145)

## 2016-05-28 MED ORDER — VITAMIN B-1 100 MG PO TABS: 100.0000 mg | ORAL_TABLET | Freq: Every day | ORAL | 3 refills | Status: DC

## 2016-05-28 MED ORDER — FOLIC ACID 1 MG PO TABS: 1.0000 mg | ORAL_TABLET | Freq: Every day | ORAL | 3 refills | Status: DC

## 2016-05-28 NOTE — Care Management Note (Signed)
Case Management Note  Patient Details  Name: Jesus Walker MRN: [id number] Date of Birth: 1960/06/27     Expected Discharge Date:       05/28/2016           Expected Discharge Plan:  Home/Self Care  In-House Referral:  NA  Discharge planning Services  CM Consult  Post Acute Care Choice:  Durable Medical Equipment Choice offered to:  Patient  DME Arranged:  Kasandra Knudsen DME Agency:  [place] Arranged:    [facility] Agency:     Status of Service:  Completed, signed off  If discussed at [place] of Stay Meetings, dates discussed:    Additional Comments: Patient discharging home today. Declines SNF. He will be at home with his girlfriend at discharge. Cane delivered to room by [facility]. Patient does not qualify for [facility].  Patient and GF aware. No other CM needs.   Izzie Geers, Chauncey Reading, RN 05/28/2016, 12:22 PM

## 2016-05-28 NOTE — Clinical Social Work Note (Addendum)
CSW met with patient. His girlfriend, Lorinda Creed, and sister, Revel Stellmach were at bedside. Patient advised that he did not want to go to placement and that he wanted to go home. He stated that he and his girlfriend live together and she is home all day. Ms. Awbrey advised that patient she and patient's brother would come by daily to check on patient. Ms. Mangrum also stated that her daughter would see about the process of having him set up with her agency so that she could provide PCA services to patient.  CSW signing off.       Eris Breck, Clydene Pugh, LCSW

## 2016-05-28 NOTE — Discharge Summary (Signed)
Physician Discharge Summary  Patient ID: Jesus Walker MRN: [id number] DOB/AGE: [birth year] 55 y.o. Primary Care Physician:Willie Plain, MD Admit date: 05/21/2016 Discharge date: 05/28/2016    Discharge Diagnoses:   Principal Problem:   Hyponatremia Active Problems:   Lung mass   Hypokalemia   Mild malnutrition (HCC)   Thrombocytopenia (HCC)   Seizure (HCC)   Alcohol dependence ([place])   Tobacco dependence   Malnutrition of moderate degree delirium tremens  Allergies as of 05/28/2016   No Known Allergies     Medication List    STOP taking these medications   amoxicillin 500 MG tablet Commonly known as:  AMOXIL     TAKE these medications   amLODipine 5 MG tablet Commonly known as:  NORVASC Take 5 mg by mouth daily.   folic acid 1 MG tablet Commonly known as:  FOLVITE Take 1 tablet (1 mg total) by mouth daily.   omeprazole 20 MG capsule Commonly known as:  PRILOSEC Take 20 mg by mouth daily.   thiamine 100 MG tablet Commonly known as:  VITAMIN B-1 Take 1 tablet (100 mg total) by mouth daily.            Durable Medical Equipment        Start     Ordered   05/23/16 0932  For home use only DME Cane  Once     05/23/16 0931      Discharged Condition: improved    Consults: neurology  Significant Diagnostic Studies: Dg Chest 2 View  Result Date: 05/21/2016 CLINICAL DATA:  Altered mental status EXAM: CHEST  2 VIEW COMPARISON:  02/29/2016 FINDINGS: Right middle lobe collapse unchanged. Elevated right hemidiaphragm unchanged. Left lung remains clear. Heart size and vascularity normal. Pulmonary hyperinflation. IMPRESSION: Right middle lobe collapse unchanged. Electronically Signed   By: Franchot Gallo M.D.   On: 05/21/2016 13:43   Ct Head Wo Contrast  Result Date: 05/21/2016 CLINICAL DATA:  Mild vascular calcification noted.  There are foci EXAM: CT HEAD WITHOUT CONTRAST TECHNIQUE: Contiguous axial images were obtained from the base of the skull  through the vertex without intravenous contrast. COMPARISON:  None. FINDINGS: Brain: There is mild diffuse atrophy. There is no intracranial mass, hemorrhage, extra-axial fluid collection, or midline shift. There is decreased attenuation in the inferior right basal ganglia region involving the inferior aspect of the right globus pallidum and putamen on the right as well as portions of the claustrum and extreme capsule on the right. This finding is best appreciated on axial slice 14 series 2. No other findings concerning for potential acute infarct elsewhere noted. Vascular: There is no hyperdense vessel. There is calcification in both carotid siphon regions. Skull: Bones are osteoporotic. No focal bone lesions are evident in the calvarium. Sinuses/Orbits: There is mucosal thickening in several ethmoid air cell regions bilaterally. Visualized paranasal sinuses elsewhere clear. There is leftward deviation of the nasal septum. Orbits appear symmetric bilaterally. Other: Mastoid air cells are clear. IMPRESSION: Decreased attenuation in the inferior somewhat posterior aspects of the right basal is suspicious for acute infarction in this area. Elsewhere there is mild atrophy with patchy periventricular small vessel disease. No hemorrhage. No well-defined mass or extra-axial fluid collection. Foci of arterial vascular calcification noted. Ethmoid sinus disease noted bilaterally. Leftward deviation of nasal septum. Electronically Signed   By: Lowella Grip III M.D.   On: 05/21/2016 13:36   Mr Brain Wo Contrast (neuro Protocol)  Result Date: 05/21/2016 CLINICAL DATA:  Altered mental status.  Rule out  CVA EXAM: MRI HEAD WITHOUT CONTRAST TECHNIQUE: Multiplanar, multiecho pulse sequences of the brain and surrounding structures were obtained without intravenous contrast. COMPARISON:  CT head 05/21/2016 FINDINGS: Brain: Image quality degraded by mild motion. Negative for acute infarct. Mild chronic microvascular ischemic  change in the white matter. Mild cerebral atrophy. Negative for hydrocephalus. Negative for hemorrhage or mass. No shift of the midline structures. Vascular: Normal arterial flow voids. Skull and upper cervical spine: Negative Sinuses/Orbits: Mucosal edema paranasal sinuses.  Normal orbit. Other: None IMPRESSION: Atrophy and chronic microvascular ischemic change. No acute abnormality. Negative for acute infarct. Electronically Signed   By: Franchot Gallo M.D.   On: 05/21/2016 15:17    Lab Results: Basic Metabolic Panel:  Recent Labs  05/27/16 0428 05/28/16 0450  NA 129* 129*  K 4.0 3.6  CL 96* 93*  CO2 28 29  GLUCOSE 102* 105*  BUN <5* <5*  CREATININE 0.58* 0.61  CALCIUM 8.3* 8.7*   Liver Function Tests: No results for input(s): AST, ALT, ALKPHOS, BILITOT, PROT, ALBUMIN in the last 72 hours.   CBC: No results for input(s): WBC, NEUTROABS, HGB, HCT, MCV, PLT in the last 72 hours.  Recent Results (from the past 240 hour(s))  MRSA PCR Screening     Status: None   Collection Time: 05/22/16  8:44 AM  Result Value Ref Range Status   MRSA by PCR NEGATIVE NEGATIVE Final    Comment:        The GeneXpert MRSA Assay (FDA approved for NASAL specimens only), is one component of a comprehensive MRSA colonization surveillance program. It is not intended to diagnose MRSA infection nor to guide or monitor treatment for MRSA infections.      Hospital Course:   This is a 55 years old male patient known case of alcohol abuse was admitted due to change in mental status and seizure like activity. His sodium and potassium was very low. Patient was admitted to ICU and was started on normal saline and potassium supplement. Neurology consult was done for evaluation of seizure. EEG was negative for seizure. Patient also developed symptoms of DT while he was in the hospital and was treated according to protocol. Patient improved. His electrolyte aalso has improved. On dischage his sodium is 129 and  K+ is 3.6. Patient is strongly advised to stop alcohol and tobacco.   Discharge Exam: Blood pressure (!) 131/58, pulse 81, temperature 98.4 F (36.9 C), temperature source Axillary, resp. rate 19, height 5\' 1"  (1.549 m), weight 51.6 kg (113 lb 12.1 oz), SpO2 100 %.   Disposition:  home    Follow-up Information    Lajeana Strough, MD Follow up in 2 week(s).   Specialty:  Internal Medicine Contact information: [place] [id number] [phone]           Signed: Rosita Fire   05/28/2016, 8:16 AM

## 2016-05-28 NOTE — Evaluation (Signed)
Physical Therapy Evaluation Patient Details Name: Jesus Walker MRN: [id number] DOB: 1960/07/14 Today's Date: 05/28/2016   History of Present Illness  DARRICK GREENLAW is a 55 y.o. male with medical history significant of HTN, GERD, asthma, and ETOH dependence presenting with seizures.  He reports that he has been having a headache and feeling dizzy.  Occasional in the past, but worse now than usual. His wife reports that he has been having audiovisual hallucinations since Monday night.  Has had 3 seizures since then.  Last drink was yesterday. Usually drinks 12 beers per day plus wine, 1/5 daily. Drank as usual Sunday and Monday but still had hallucinations/seizures.  Last time he DID NOT drink for 2 days was in December of last year.  No h/o seizures in the past.  Has also vomited a little bit.  Has never been to ETOH rehab in the past.  Clinical Impression  Patient received supine in bed hospital bed with family member present. Patient able to complete supine to sit with supervision, and is able to maintain static seated position at EOB with just supervision from PT. Noted general weakness, also fluctuation of O2 on RA from 90s to 70s however patient not symptomatic and this fluctuation appears likely related to sensor rather than true fluctuation. However with standing, heel raises, standing marches, and stepping forward/backwards at EOB, patient did appear to become fatigue and more unsteady, also with HR reaching 112BPM from a baseline of approximately 85 at beginning of session. Noted increased unsteadiness with closed chain activities as well as difficulty with step lengths and balance recovery strategies with stepping. Spoke to wife privately outside of patient room, who does voice concerns that she will not be able to take care of/manage patient at home on her own. Recommend ideally SNF but at bare minimum patient/family will require 24/7 external assistance based on patient's current presentation  on this moment in time. Patient left supine in bed with all needs met, wife present and RN informed regarding patient performance today.     Follow Up Recommendations Other (comment) (ideally, SNF; however with patient's current mobility status they will at bare minimum need 24/7 assist )    Equipment Recommendations  Standard walker    Recommendations for Other Services OT consult;Speech consult     Precautions / Restrictions Precautions Precautions: Fall Restrictions Weight Bearing Restrictions: No      Mobility  Bed Mobility Overal bed mobility: Needs Assistance Bed Mobility: Supine to Sit;Sit to Supine     Supine to sit: Supervision Sit to supine: Min guard      Transfers Overall transfer level: Needs assistance Equipment used: Standard walker Transfers: Sit to/from Stand Sit to Stand: Min assist         General transfer comment: posterior lean noted   Ambulation/Gait Ambulation/Gait assistance: Mod assist Ambulation Distance (Feet): 1 Feet Assistive device: Standard walker Gait Pattern/deviations: Decreased step length - right;Decreased step length - left;Decreased dorsiflexion - right;Decreased dorsiflexion - left;Decreased weight shift to right;Decreased weight shift to left;Shuffle;Leaning posteriorly;Trunk flexed;Narrow base of support   Gait velocity interpretation: <1.8 ft/sec, indicative of risk for recurrent falls General Gait Details: very unsteady even with walker, termors (possibly related to DTs) noted, Mod A for taking steps beside the bed with walker   Stairs            Wheelchair Mobility    Modified Rankin (Stroke Patients Only)       Balance Overall balance assessment: Needs assistance Sitting-balance support: Bilateral  upper extremity supported Sitting balance-Leahy Scale: Good     Standing balance support: Bilateral upper extremity supported Standing balance-Leahy Scale: Fair                                Pertinent Vitals/Pain Pain Assessment: No/denies pain    Home Living Family/patient expects to be discharged to:: Private residence Living Arrangements: Spouse/significant other Available Help at Discharge: Family Type of Home: Other(Comment) (doublewide, with ramp ) Home Access: Ramped entrance     Home Layout: One level Home Equipment: Other (comment) Additional Comments: [email or web address]     Prior Function Level of Independence: Independent               Hand Dominance        Extremity/Trunk Assessment   Upper Extremity Assessment Upper Extremity Assessment: Defer to OT evaluation    Lower Extremity Assessment Lower Extremity Assessment: Generalized weakness    Cervical / Trunk Assessment Cervical / Trunk Assessment: Kyphotic  Communication      Cognition Arousal/Alertness: Awake/alert Behavior During Therapy: WFL for tasks assessed/performed;Flat affect Overall Cognitive Status: Impaired/Different from baseline (patient currently in DTs from ETOH abuse )                      General Comments General comments (skin integrity, edema, etc.): tremors, possibly related to ETOH DTs, noted; difficulty advancing feet for stepping and marching at EOB     Exercises     Assessment/Plan    PT Assessment Patient needs continued PT services  PT Problem List Decreased strength;Decreased safety awareness;Decreased mobility;Decreased coordination;Decreased activity tolerance;Decreased balance          PT Treatment Interventions DME instruction;Therapeutic activities;Gait training;Therapeutic exercise;Patient/family education;Stair training;Balance training;Functional mobility training;Neuromuscular re-education    PT Goals (Current goals can be found in the Care Plan section)  Acute Rehab PT Goals Patient Stated Goal: Based on skilled clinical assessment of functional mobility, strength, functional transfers, balance  PT Goal Formulation: With  patient/family Time For Goal Achievement: 06/11/16 Potential to Achieve Goals: Fair    Frequency Min 3X/week   Barriers to discharge        Co-evaluation               End of Session Equipment Utilized During Treatment: Gait belt Activity Tolerance: Patient tolerated treatment well Patient left: in bed;with call bell/phone within reach;with family/visitor present Nurse Communication: Mobility status;Other (comment) (mobility status, wife does not feel she will be able to handle him at home with his current mobility  status )    Functional Assessment Tool Used: Based on skilled clinical assessment of strength, fucntional mobility, functional transfers, attempts at gait, balance  Functional Limitation: Mobility: Walking and moving around Mobility: Walking and Moving Around Current Status (Q7341): At least 60 percent but less than 80 percent impaired, limited or restricted Mobility: Walking and Moving Around Goal Status [phone]): At least 40 percent but less than 60 percent impaired, limited or restricted    Time: 0915-0945 PT Time Calculation (min) (ACUTE ONLY): 30 min   Charges:   PT Evaluation $PT Eval Moderate Complexity: 1 Procedure     PT G Codes:   PT G-Codes **NOT FOR INPATIENT CLASS** Functional Assessment Tool Used: Based on skilled clinical assessment of strength, fucntional mobility, functional transfers, attempts at gait, balance  Functional Limitation: Mobility: Walking and moving around Mobility: Walking and Moving Around Current Status (W4097): At least 60 percent  but less than 80 percent impaired, limited or restricted Mobility: Walking and Moving Around Goal Status [phone]): At least 40 percent but less than 60 percent impaired, limited or restricted    Deniece Ree PT, DPT [phone]

## 2016-05-28 NOTE — Progress Notes (Signed)
Discharge instructions discussed & given . IV's removed. Pt left unit via wheelchair to family vehicle.

## 2017-12-15 ENCOUNTER — Encounter: Payer: Self-pay | Admitting: General Surgery

## 2017-12-15 ENCOUNTER — Ambulatory Visit (INDEPENDENT_AMBULATORY_CARE_PROVIDER_SITE_OTHER): Payer: Medicaid Other | Admitting: General Surgery

## 2017-12-15 VITALS — BP 117/68 | HR 90 | Temp 99.1°F | Resp 18 | Wt 112.0 lb

## 2017-12-15 DIAGNOSIS — D171 Benign lipomatous neoplasm of skin and subcutaneous tissue of trunk: Secondary | ICD-10-CM | POA: Diagnosis not present

## 2017-12-15 NOTE — Patient Instructions (Signed)
Lipoma A lipoma is a noncancerous (benign) tumor that is made up of fat cells. This is a very common type of soft-tissue growth. Lipomas are usually found under the skin (subcutaneous). They may occur in any tissue of the body that contains fat. Common areas for lipomas to appear include the back, shoulders, buttocks, and thighs. Lipomas grow slowly, and they are usually painless. Most lipomas do not cause problems and do not require treatment. What are the causes? The cause of this condition is not known. What increases the risk? This condition is more likely to develop in:  People who are 40-60 years old.  People who have a family history of lipomas.  What are the signs or symptoms? A lipoma usually appears as a small, round bump under the skin. It may feel soft or rubbery, but the firmness can vary. Most lipomas are not painful. However, a lipoma may become painful if it is located in an area where it pushes on nerves. How is this diagnosed? A lipoma can usually be diagnosed with a physical exam. You may also have tests to confirm the diagnosis and to rule out other conditions. Tests may include:  Imaging tests, such as a CT scan or MRI.  Removal of a tissue sample to be looked at under a microscope (biopsy).  How is this treated? Treatment is not needed for small lipomas that are not causing problems. If a lipoma continues to get bigger or it causes problems, removal is often the best option. Lipomas can also be removed to improve appearance. Removal of a lipoma is usually done with a surgery in which the fatty cells and the surrounding capsule are removed. Most often, a medicine that numbs the area (local anesthetic) is used for this procedure. Follow these instructions at home:  Keep all follow-up visits as directed by your health care provider. This is important. Contact a health care provider if:  Your lipoma becomes larger or hard.  Your lipoma becomes painful, red, or  increasingly swollen. These could be signs of infection or a more serious condition. This information is not intended to replace advice given to you by your health care provider. Make sure you discuss any questions you have with your health care provider. Document Released: 05/09/2002 Document Revised: 10/25/2015 Document Reviewed: 05/15/2014 Elsevier Interactive Patient Education  2018 [employer]  

## 2017-12-15 NOTE — Progress Notes (Signed)
[name] History and Physical  Reason for Referral: Lipoma on left flank  Referring Physician: Dr. Kerri Perches is a 57 y.o. male.  HPI: Jesus Walker is a 57 yo that has HTN, GERD, alcohol dependence who recently went for his primary care visit and complained of a "knot" on his left flank/ back.  The patient reports that about 2 months ago the area was causing him a significant amount of discomfort but since that time he is better.  He says that the area has been there for years and has not growing. He does not recalling injuring the area or ever have drainage or swelling of the area. He also reports a few years ago he had a collapsed lung that Dr. Luan Pulling had to do a bronchoscopy on with brushing.  There were no malignant cells noted.   Past Medical History:  Diagnosis Date  . Alcohol dependence ([place])   . Asthma   . GERD (gastroesophageal reflux disease)   . Hypertension     Past Surgical History:  Procedure Laterality Date  . BRONCHIAL BRUSHINGS  01/03/2016   Procedure: BRONCHIAL BRUSHINGS;  Surgeon: Sinda Du, MD;  Location: [facility];  Service: Cardiopulmonary;;  . BRONCHIAL WASHINGS  01/03/2016   Procedure: BRONCHIAL WASHINGS;  Surgeon: Sinda Du, MD;  Location: [facility];  Service: Cardiopulmonary;;  . FLEXIBLE BRONCHOSCOPY N/A 01/03/2016   Procedure: FLEXIBLE BRONCHOSCOPY;  Surgeon: Sinda Du, MD;  Location: [facility];  Service: Cardiopulmonary;  Laterality: N/A;    Family History  Problem Relation Age of Onset  . Asthma Mother 56  . Asthma Father 42    Social History   Tobacco Use  . Smoking status: Current Every Day Smoker    Packs/day: 1.00    Types: Cigarettes  . Smokeless tobacco: Never Used  Substance Use Topics  . Alcohol use: Yes    Alcohol/week: 7.2 oz    Types: 12 Cans of beer per week    Comment: drinks 12 pack + 1/5 wine daily  . Drug use: No    Medications: I have reviewed the patient's current  medications. Allergies as of 12/15/2017   No Known Allergies     Medication List        Accurate as of 12/15/17 10:50 AM. Always use your most recent med list.          amLODipine 5 MG tablet Commonly known as:  NORVASC Take 5 mg by mouth daily.   folic acid 1 MG tablet Commonly known as:  FOLVITE Take 1 tablet (1 mg total) by mouth daily.   omeprazole 20 MG capsule Commonly known as:  PRILOSEC Take 20 mg by mouth daily.   thiamine 100 MG tablet Commonly known as:  VITAMIN B-1 Take 1 tablet (100 mg total) by mouth daily.        ROS:  A comprehensive review of systems was negative except for: Respiratory: positive for cough and wheezing Cardiovascular: positive for chest pain and HTN Gastrointestinal: positive for abdominal pain and nausea Genitourinary: positive for frequency Musculoskeletal: positive for back pain, neck pain and stiff joints Neurological: positive for dizziness dry skin, rash  Blood pressure 117/68, pulse 90, temperature 99.1 F (37.3 C), temperature source Temporal, resp. rate 18, weight 112 lb (50.8 kg). Physical Exam  Constitutional: He appears well-developed. No distress.  thin  HENT:  Head: Normocephalic.  Eyes: Pupils are equal, round, and reactive to light.  Neck: Normal range of motion.  Cardiovascular: Normal rate and regular rhythm.  Pulmonary/Chest: Effort normal. He has rales.  Left flank/ back at the costal margin is a oblong 3cmX1cm area that is superficial but mobile, not esp tender  Abdominal: Soft. He exhibits no distension. There is no tenderness.  Musculoskeletal: He exhibits no edema.  Vitals reviewed.   Results: Looked at CT chest- does not go down far enough to see the area of question  Assessment & Plan:  Jesus Walker is a 57 y.o. male with what clinically appears to be a lipoma on the left back/ flank that was causing him discomfort.  It is now not bothering him.  He reports it has been there for years and has  not really changed. We discussed lipomas and I have him information on lipomas.  We discussed that he does not have to get this removed now if it is now a problem for him and that they are not worrisome.  We discussed to look out for increasing size or worsening pain as an indicator to go ahead and get it removed.  -Follow up as needed if growing or causing discomfort   All questions were answered to the satisfaction of the patient and his sister.   Virl Cagey 12/15/2017, 10:50 AM

## 2018-05-26 ENCOUNTER — Observation Stay (HOSPITAL_COMMUNITY)
Admission: EM | Admit: 2018-05-26 | Discharge: 2018-05-27 | Disposition: A | Discharge status: Home / Self Care | Payer: Medicaid Other | Attending: Internal Medicine | Admitting: Internal Medicine

## 2018-05-26 ENCOUNTER — Encounter (HOSPITAL_COMMUNITY): Payer: Self-pay | Admitting: Emergency Medicine

## 2018-05-26 ENCOUNTER — Emergency Department (HOSPITAL_COMMUNITY): Payer: Medicaid Other

## 2018-05-26 ENCOUNTER — Other Ambulatory Visit: Payer: Self-pay

## 2018-05-26 DIAGNOSIS — J441 Chronic obstructive pulmonary disease with (acute) exacerbation: Secondary | ICD-10-CM | POA: Diagnosis not present

## 2018-05-26 DIAGNOSIS — R0602 Shortness of breath: Secondary | ICD-10-CM | POA: Diagnosis present

## 2018-05-26 DIAGNOSIS — E871 Hypo-osmolality and hyponatremia: Secondary | ICD-10-CM | POA: Diagnosis not present

## 2018-05-26 DIAGNOSIS — K219 Gastro-esophageal reflux disease without esophagitis: Secondary | ICD-10-CM | POA: Diagnosis not present

## 2018-05-26 DIAGNOSIS — R0789 Other chest pain: Secondary | ICD-10-CM

## 2018-05-26 DIAGNOSIS — I10 Essential (primary) hypertension: Secondary | ICD-10-CM | POA: Diagnosis not present

## 2018-05-26 DIAGNOSIS — R17 Unspecified jaundice: Secondary | ICD-10-CM

## 2018-05-26 DIAGNOSIS — J9601 Acute respiratory failure with hypoxia: Secondary | ICD-10-CM | POA: Diagnosis not present

## 2018-05-26 DIAGNOSIS — F1020 Alcohol dependence, uncomplicated: Secondary | ICD-10-CM | POA: Diagnosis not present

## 2018-05-26 DIAGNOSIS — F17210 Nicotine dependence, cigarettes, uncomplicated: Secondary | ICD-10-CM | POA: Diagnosis not present

## 2018-05-26 DIAGNOSIS — Z79899 Other long term (current) drug therapy: Secondary | ICD-10-CM | POA: Insufficient documentation

## 2018-05-26 DIAGNOSIS — F10930 Alcohol use, unspecified with withdrawal, uncomplicated: Secondary | ICD-10-CM

## 2018-05-26 DIAGNOSIS — Z720 Tobacco use: Secondary | ICD-10-CM

## 2018-05-26 DIAGNOSIS — F10230 Alcohol dependence with withdrawal, uncomplicated: Secondary | ICD-10-CM

## 2018-05-26 DIAGNOSIS — E878 Other disorders of electrolyte and fluid balance, not elsewhere classified: Secondary | ICD-10-CM

## 2018-05-26 HISTORY — DX: Chronic obstructive pulmonary disease, unspecified: J44.9

## 2018-05-26 LAB — CBC WITH DIFFERENTIAL/PLATELET
Abs Immature Granulocytes: 0.01 10*3/uL (ref 0.00–0.07)
BASOS PCT: 1 %
Basophils Absolute: 0 10*3/uL (ref 0.0–0.1)
EOS PCT: 3 %
Eosinophils Absolute: 0.1 10*3/uL (ref 0.0–0.5)
HCT: 41.1 % (ref 39.0–52.0)
HEMOGLOBIN: 15.3 g/dL (ref 13.0–17.0)
Immature Granulocytes: 0 %
LYMPHS ABS: 0.5 10*3/uL — AB (ref 0.7–4.0)
Lymphocytes Relative: 10 %
MCH: 37.9 pg — AB (ref 26.0–34.0)
MCHC: 37.2 g/dL — AB (ref 30.0–36.0)
MCV: 101.7 fL — AB (ref 80.0–100.0)
MONO ABS: 0.1 10*3/uL (ref 0.1–1.0)
MONOS PCT: 2 %
NEUTROS PCT: 84 %
Neutro Abs: 3.9 10*3/uL (ref 1.7–7.7)
Platelets: 315 10*3/uL (ref 150–400)
RBC: 4.04 MIL/uL — ABNORMAL LOW (ref 4.22–5.81)
RDW: 11.7 % (ref 11.5–15.5)
WBC: 4.7 10*3/uL (ref 4.0–10.5)
nRBC: 0 % (ref 0.0–0.2)

## 2018-05-26 LAB — COMPREHENSIVE METABOLIC PANEL
ALK PHOS: 99 U/L (ref 38–126)
ALT: 16 U/L (ref 0–44)
AST: 39 U/L (ref 15–41)
Albumin: 3.6 g/dL (ref 3.5–5.0)
Anion gap: 12 (ref 5–15)
BUN: 7 mg/dL (ref 6–20)
CALCIUM: 9.2 mg/dL (ref 8.9–10.3)
CHLORIDE: 83 mmol/L — AB (ref 98–111)
CO2: 25 mmol/L (ref 22–32)
CREATININE: 0.82 mg/dL (ref 0.61–1.24)
Glucose, Bld: 103 mg/dL — ABNORMAL HIGH (ref 70–99)
Potassium: 4 mmol/L (ref 3.5–5.1)
Sodium: 120 mmol/L — ABNORMAL LOW (ref 135–145)
Total Bilirubin: 1.5 mg/dL — ABNORMAL HIGH (ref 0.3–1.2)
Total Protein: 7.7 g/dL (ref 6.5–8.1)

## 2018-05-26 LAB — BRAIN NATRIURETIC PEPTIDE: B NATRIURETIC PEPTIDE 5: 32 pg/mL (ref 0.0–100.0)

## 2018-05-26 LAB — TROPONIN I: Troponin I: <0.03 ng/mL (ref <0.03)

## 2018-05-26 MED ORDER — LORAZEPAM 2 MG/ML IJ SOLN
2.0000 mg | INTRAMUSCULAR | Status: DC | PRN
  Administered 2018-05-27: 2 mg via INTRAVENOUS
  Filled 2018-05-26: qty 1

## 2018-05-26 MED ORDER — LORAZEPAM 1 MG PO TABS: 0.0000 mg | ORAL_TABLET | Freq: Four times a day (QID) | ORAL | Status: DC

## 2018-05-26 MED ORDER — LORAZEPAM 2 MG/ML IJ SOLN: 0.0000 mg | Freq: Four times a day (QID) | INTRAMUSCULAR | Status: DC

## 2018-05-26 MED ORDER — PREDNISONE 20 MG PO TABS
40.0000 mg | ORAL_TABLET | Freq: Every day | ORAL | Status: DC
  Administered 2018-05-27: 40 mg via ORAL
  Filled 2018-05-26: qty 2

## 2018-05-26 MED ORDER — ADULT MULTIVITAMIN W/MINERALS CH
1.0000 | ORAL_TABLET | Freq: Every day | ORAL | Status: DC
  Administered 2018-05-27: 1 via ORAL
  Filled 2018-05-26: qty 1

## 2018-05-26 MED ORDER — VITAMIN B-1 100 MG PO TABS
100.0000 mg | ORAL_TABLET | Freq: Every day | ORAL | Status: DC
  Administered 2018-05-26 – 2018-05-27 (×2): 100 mg via ORAL
  Filled 2018-05-26 (×2): qty 1

## 2018-05-26 MED ORDER — LEVALBUTEROL HCL 1.25 MG/0.5ML IN NEBU
1.2500 mg | INHALATION_SOLUTION | Freq: Once | RESPIRATORY_TRACT | Status: AC
  Administered 2018-05-26: 1.25 mg via RESPIRATORY_TRACT
  Filled 2018-05-26: qty 0.5

## 2018-05-26 MED ORDER — LEVALBUTEROL HCL 0.63 MG/3ML IN NEBU
0.6300 mg | INHALATION_SOLUTION | Freq: Four times a day (QID) | RESPIRATORY_TRACT | Status: DC
  Administered 2018-05-27: 0.63 mg via RESPIRATORY_TRACT
  Filled 2018-05-26: qty 3

## 2018-05-26 MED ORDER — LORAZEPAM 1 MG PO TABS: 0.0000 mg | ORAL_TABLET | Freq: Two times a day (BID) | ORAL | Status: DC

## 2018-05-26 MED ORDER — LORAZEPAM 2 MG/ML IJ SOLN
0.5000 mg | Freq: Once | INTRAMUSCULAR | Status: AC
  Administered 2018-05-26: 0.5 mg via INTRAVENOUS
  Filled 2018-05-26: qty 1

## 2018-05-26 MED ORDER — SODIUM CHLORIDE 0.9 % IV SOLN
INTRAVENOUS | Status: DC
  Administered 2018-05-26: 20:00:00 via INTRAVENOUS

## 2018-05-26 MED ORDER — ENOXAPARIN SODIUM 40 MG/0.4ML ~~LOC~~ SOLN
40.0000 mg | SUBCUTANEOUS | Status: DC
  Filled 2018-05-26: qty 0.4

## 2018-05-26 MED ORDER — PANTOPRAZOLE SODIUM 40 MG PO TBEC
40.0000 mg | DELAYED_RELEASE_TABLET | Freq: Every day | ORAL | Status: DC
  Administered 2018-05-27: 40 mg via ORAL
  Filled 2018-05-26: qty 1

## 2018-05-26 MED ORDER — AZITHROMYCIN 250 MG PO TABS
500.0000 mg | ORAL_TABLET | Freq: Once | ORAL | Status: AC
  Administered 2018-05-27: 500 mg via ORAL
  Filled 2018-05-26: qty 2

## 2018-05-26 MED ORDER — ACETAMINOPHEN 325 MG PO TABS: 650.0000 mg | ORAL_TABLET | Freq: Four times a day (QID) | ORAL | Status: DC | PRN

## 2018-05-26 MED ORDER — ACETAMINOPHEN 650 MG RE SUPP: 650.0000 mg | Freq: Four times a day (QID) | RECTAL | Status: DC | PRN

## 2018-05-26 MED ORDER — IPRATROPIUM BROMIDE 0.02 % IN SOLN
0.5000 mg | Freq: Four times a day (QID) | RESPIRATORY_TRACT | Status: DC
  Administered 2018-05-27: 0.5 mg via RESPIRATORY_TRACT
  Filled 2018-05-26: qty 2.5

## 2018-05-26 MED ORDER — AZITHROMYCIN 250 MG PO TABS
250.0000 mg | ORAL_TABLET | Freq: Every day | ORAL | Status: DC
  Administered 2018-05-27: 250 mg via ORAL
  Filled 2018-05-26: qty 1

## 2018-05-26 MED ORDER — THIAMINE HCL 100 MG/ML IJ SOLN: 100.0000 mg | Freq: Every day | INTRAMUSCULAR | Status: DC

## 2018-05-26 MED ORDER — FOLIC ACID 1 MG PO TABS
1.0000 mg | ORAL_TABLET | Freq: Every day | ORAL | Status: DC
  Administered 2018-05-27: 1 mg via ORAL
  Filled 2018-05-26: qty 1

## 2018-05-26 MED ORDER — LORAZEPAM 2 MG/ML IJ SOLN: 0.0000 mg | Freq: Two times a day (BID) | INTRAMUSCULAR | Status: DC

## 2018-05-26 MED ORDER — NICOTINE 21 MG/24HR TD PT24
21.0000 mg | MEDICATED_PATCH | Freq: Every day | TRANSDERMAL | Status: DC
  Administered 2018-05-27: 21 mg via TRANSDERMAL
  Filled 2018-05-26: qty 1

## 2018-05-26 NOTE — ED Notes (Signed)
EKG handed to Dr Lita Mains

## 2018-05-26 NOTE — ED Triage Notes (Signed)
EMS called out for SOB x 1 week, per EMS pt has hx of collapsed left upper lung 3 yrs ago and this is where pt reports pain, hx of COPD and HTN, pt given Albuterol 2.5 and Solumedrol 125 en route

## 2018-05-26 NOTE — H&P (Signed)
History and Physical    Jesus Walker:[id number] DOB: Nov 11, 1960 DOA: 05/26/2018  PCP: Rosita Fire, MD Patient coming from: Home  Chief Complaint: SOB  HPI: Jesus Walker is a 57 y.o. male with medical history significant of alcohol dependence, asthma, COPD, GERD, hypertension presenting to the hospital for evaluation of shortness of breath and chest pain.  Patient states he has chronic shortness of breath due to COPD but does not use home oxygen.  For the past 3 days his shortness of breath has been worse and he has been wheezing and having a productive coughing.  Reports compliance with home inhalers.  Also reports having substernal/left-sided chest tightness for the past 2 days whenever he gets short of breath.  No associated diaphoresis or nausea.  Denies prior history of MI.  Denies family history of coronary artery disease/MI.  No abdominal pain or vomiting.  He drinks a 12 pack of beer daily, last drink was early this morning.  He smokes 1 pack of cigarettes per day and is currently not interested in quitting.   ED Course: Afebrile, tachycardic, on 2 L supplemental oxygen.  No leukocytosis.  BNP normal.  Sodium 120, chronically low and in the mid-upper 120s.  Chloride 83, chronically low.  T bili 1.5, remainder of LFTs normal.  I-STAT troponin negative and EKG not suggestive of ACS.  Chest x-ray showing stable elevation of right hemidiaphragm and right basilar scarring; no active disease. Patient received Solu-Medrol 125 mg and albuterol by EMS.  Noted to be wheezing per ED provider.  Started on sodium chloride infusion in the ED.  Review of Systems: As per HPI otherwise 10 point review of systems negative.  Past Medical History:  Diagnosis Date  . Alcohol dependence ([place])   . Asthma   . COPD (chronic obstructive pulmonary disease) ([place])   . GERD (gastroesophageal reflux disease)   . Hypertension     Past Surgical History:  Procedure Laterality Date  . BRONCHIAL BRUSHINGS   01/03/2016   Procedure: BRONCHIAL BRUSHINGS;  Surgeon: Sinda Du, MD;  [facility];  Service: Cardiopulmonary;;  . BRONCHIAL WASHINGS  01/03/2016   Procedure: BRONCHIAL WASHINGS;  Surgeon: Sinda Du, MD;  [facility];  Service: Cardiopulmonary;;  . FLEXIBLE BRONCHOSCOPY N/A 01/03/2016   Procedure: FLEXIBLE BRONCHOSCOPY;  Surgeon: Sinda Du, MD;  [facility];  Service: Cardiopulmonary;  Laterality: N/A;     reports that he has been smoking cigarettes. He has been smoking about 1.00 pack per day. He has never used smokeless tobacco. He reports current alcohol use of about 12.0 standard drinks of alcohol per week. He reports that he does not use drugs.  No Known Allergies  Family History  Problem Relation Age of Onset  . Asthma Mother 47  . Asthma Father 50    Prior to Admission medications   Medication Sig Start Date End Date Taking? Authorizing Provider  amLODipine (NORVASC) 5 MG tablet Take 5 mg by mouth daily.   Yes [provider]  folic acid (FOLVITE) 1 MG tablet Take 1 tablet (1 mg total) by mouth daily. 05/28/16  Yes Rosita Fire, MD  omeprazole (PRILOSEC) 20 MG capsule Take 20 mg by mouth daily.   Yes [provider]  thiamine (VITAMIN B-1) 100 MG tablet Take 1 tablet (100 mg total) by mouth daily. 05/28/16  Yes Rosita Fire, MD    Physical Exam: Vitals:   05/26/18 1945 05/26/18 2000 05/26/18 2100 05/26/18 2145  BP:  125/80 127/73   Pulse: (!) 112 (!) 112 (!) 110   Resp: 16 17 18    Temp:   97.7 F (36.5 C)   TempSrc:   Oral   SpO2: 95% 95% 93% 98%  Weight:      Height:        Physical Exam  Constitutional: He is oriented to person, place, and time. No distress.  Sitting comfortably in a hospital bed  HENT:  Head: Normocephalic.  Mouth/Throat: Oropharynx is clear and moist.  Eyes: Right eye exhibits no discharge. Left eye exhibits no discharge.  Neck: Neck supple.  Cardiovascular: Regular rhythm  and intact distal pulses.  Mildly tachycardic  Pulmonary/Chest: He has wheezes. He has no rales.  Speaking clearly in full sentences No increased work of breathing On 1 L supplemental oxygen Scattered wheezes  Abdominal: Soft. Bowel sounds are normal. He exhibits no distension. There is no abdominal tenderness. There is no guarding.  Musculoskeletal:        General: No edema.  Neurological: He is alert and oriented to person, place, and time.  Skin: Skin is warm and dry. He is not diaphoretic.     Labs on Admission: I have personally reviewed following labs and imaging studies  CBC: Recent Labs  Lab 05/26/18 1816  WBC 4.7  NEUTROABS 3.9  HGB 15.3  HCT 41.1  MCV 101.7*  PLT 616   Basic Metabolic Panel: Recent Labs  Lab 05/26/18 1816  NA 120*  K 4.0  CL 83*  CO2 25  GLUCOSE 103*  BUN 7  CREATININE 0.82  CALCIUM 9.2   GFR: Estimated Creatinine Clearance: 72.9 mL/min (by C-G formula based on SCr of 0.82 mg/dL). Liver Function Tests: Recent Labs  Lab 05/26/18 1816  AST 39  ALT 16  ALKPHOS 99  BILITOT 1.5*  PROT 7.7  ALBUMIN 3.6   No results for input(s): LIPASE, AMYLASE in the last 168 hours. No results for input(s): AMMONIA in the last 168 hours. Coagulation Profile: No results for input(s): INR, PROTIME in the last 168 hours. Cardiac Enzymes: Recent Labs  Lab 05/26/18 1816  TROPONINI <0.03   BNP (last 3 results) No results for input(s): PROBNP in the last 8760 hours. HbA1C: No results for input(s): HGBA1C in the last 72 hours. CBG: No results for input(s): GLUCAP in the last 168 hours. Lipid Profile: No results for input(s): CHOL, HDL, LDLCALC, TRIG, CHOLHDL, LDLDIRECT in the last 72 hours. Thyroid Function Tests: No results for input(s): TSH, T4TOTAL, FREET4, T3FREE, THYROIDAB in the last 72 hours. Anemia Panel: No results for input(s): VITAMINB12, FOLATE, FERRITIN, TIBC, IRON, RETICCTPCT in the last 72 hours. Urine analysis:    Component  Value Date/Time   COLORURINE AMBER (A) 05/21/2016 1145   APPEARANCEUR CLEAR 05/21/2016 1145   LABSPEC <1.005 (L) 05/21/2016 1145   PHURINE 6.0 05/21/2016 1145   GLUCOSEU NEGATIVE 05/21/2016 1145   HGBUR NEGATIVE 05/21/2016 1145   BILIRUBINUR NEGATIVE 05/21/2016 1145   KETONESUR NEGATIVE 05/21/2016 1145   PROTEINUR NEGATIVE 05/21/2016 1145   NITRITE NEGATIVE 05/21/2016 1145   LEUKOCYTESUR NEGATIVE 05/21/2016 1145    Radiological Exams on Admission: Dg Chest Portable 1 View  Result Date: 05/26/2018 CLINICAL DATA:  Shortness of breath for 1 week. COPD. Asthma. EXAM: PORTABLE CHEST 1 VIEW COMPARISON:  05/21/2016 FINDINGS: The heart size and mediastinal contours are within normal limits. Elevation of right hemidiaphragm and right basilar scarring remains stable. No evidence of pulmonary infiltrate or edema. No evidence of pleural effusion. IMPRESSION:  Stable elevation of right hemidiaphragm and right basilar scarring. No active disease. Electronically Signed   By: Earle Gell M.D.   On: 05/26/2018 17:40    EKG: Independently reviewed.  Sinus tachycardia (heart rate 106).  Assessment/Plan Principal Problem:   COPD exacerbation (HCC) Active Problems:   Hyponatremia   Alcohol dependence ([place])   Acute respiratory failure with hypoxia (HCC)   Chest tightness   Hypochloremia   Serum total bilirubin elevated   Tobacco use   HTN (hypertension)   GERD (gastroesophageal reflux disease)   Acute hypoxic respiratory failure secondary to COPD, asthma exacerbation Presenting with a 3-day history of dyspnea, wheezing, and productive cough.  Wheezing on exam and mildly tachycardic.  Currently requiring 1 L supplemental oxygen.  Afebrile and no leukocytosis.  BNP normal. Chest x-ray showing stable elevation of right hemidiaphragm and right basilar scarring; no active disease. -Patient received Solu-Medrol 125 mg by EMS.  Continue prednisone 40 mg daily starting tomorrow  morning. -Levalbuterol-ipratropium every 6 hours -Azithromycin -Supplemental oxygen  Chest tightness Presenting with a 2-day history of left-sided/substernal chest tightness with dyspnea.  No associated diaphoresis or nausea.  No documented history of CAD.  Risk factors for CAD include age, gender, hypertension, and tobacco use.  I-STAT troponin negative and EKG not suggestive of ACS.  Patient denies having any chest pain at present and appears comfortable on exam. -Continue to trend troponin -Cardiac monitoring -Echocardiogram  Hyponatremia Sodium 120, chronically low and in the mid-upper 120s.  Likely related to alcohol use. -Patient was started on normal saline infusion in the ED, will hold at this time to avoid rapid correction.  Recheck BMP.  Check serum osmolarity, urine sodium, and urine osmolarity. -Continue to monitor closely  Hypochloremia Chloride 83, chronically low.  Patient denies having any vomiting.  -Continue to monitor  Elevated T bili T bili mildly elevated 1.5, remainder of LFTs normal.  Patient denies having any abdominal pain and exam benign. -Right upper quadrant ultrasound  Alcohol dependence Drinks a 12 pack of beer daily, last drink early this morning.  Mildly tachycardic.  He is at high risk for withdrawal. -Stepdown CIWA protocol; Ativan PRN -Thiamine, folate, multivitamin  Tobacco use Smokes 1 pack of cigarettes daily.  I discussed smoking cessation and he appears to be in the pre-contemplative stage at this time. -NicoDerm patch  Hypertension -Currently normotensive.  Hold home amlodipine at this time.  GERD -Continue PPI  DVT prophylaxis: Lovenox Code Status: Patient wishes to be full code. Family Communication: No family available. Disposition Plan: Anticipate discharge to home in 1 to 2 days. Consults called: None Admission status: Observation, stepdown   Shela Leff MD [facility] Pager [phone]  If 7PM-7AM, please  contact night-coverage [email or web address] Password [facility]  05/27/2018, 12:15 AM

## 2018-05-26 NOTE — ED Provider Notes (Signed)
[facility] EMERGENCY DEPARTMENT Provider Note   CSN: [id number] Arrival date & time: 05/26/18  1633     History   Chief Complaint Chief Complaint  Patient presents with  . Shortness of Breath    HPI Jesus Walker is a 57 y.o. male.  HPI Presents with 2 days of shortness of breath, left-sided chest pain worse with deep breathing, subjective fevers and chills and nonproductive cough.  States he has been using inhaler at home.  Was given albuterol and Solu-Medrol in route.  No new lower extremity swelling or pain.  Patient states he does get more short of breath when he lies flat. Past Medical History:  Diagnosis Date  . Alcohol dependence ([place])   . Asthma   . COPD (chronic obstructive pulmonary disease) ([place])   . GERD (gastroesophageal reflux disease)   . Hypertension     Patient Active Problem List   Diagnosis Date Noted  . COPD exacerbation ([place]) 05/26/2018  . Malnutrition of moderate degree 05/23/2016  . Hyponatremia 05/21/2016  . Hypokalemia 05/21/2016  . Mild malnutrition ([place]) 05/21/2016  . Thrombocytopenia ([place]) 05/21/2016  . Seizure ([place]) 05/21/2016  . Alcohol dependence ([place]) 05/21/2016  . Tobacco dependence 05/21/2016  . Chest pain 12/26/2015  . 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 CAP (community acquired pneumonia) 12/26/2015    Past Surgical History:  Procedure Laterality Date  . BRONCHIAL BRUSHINGS  01/03/2016   Procedure: BRONCHIAL BRUSHINGS;  Surgeon: Sinda Du, MD;  [facility];  Service: Cardiopulmonary;;  . BRONCHIAL WASHINGS  01/03/2016   Procedure: BRONCHIAL WASHINGS;  Surgeon: Sinda Du, MD;  [facility];  Service: Cardiopulmonary;;  . FLEXIBLE BRONCHOSCOPY N/A 01/03/2016   Procedure: FLEXIBLE BRONCHOSCOPY;  Surgeon: Sinda Du, MD;  [facility];  Service: Cardiopulmonary;  Laterality: N/A;        Home Medications    Prior to Admission medications   Medication Sig Start Date End Date Taking? Authorizing  Provider  amLODipine (NORVASC) 5 MG tablet Take 5 mg by mouth daily.    [provider]  folic acid (FOLVITE) 1 MG tablet Take 1 tablet (1 mg total) by mouth daily. 05/28/16   Rosita Fire, MD  omeprazole (PRILOSEC) 20 MG capsule Take 20 mg by mouth daily.    [provider]  thiamine (VITAMIN B-1) 100 MG tablet Take 1 tablet (100 mg total) by mouth daily. 05/28/16   Rosita Fire, MD    Family History Family History  Problem Relation Age of Onset  . Asthma Mother 64  . Asthma Father 1    Social History Social History   Tobacco Use  . Smoking status: Current Every Day Smoker    Packs/day: 1.00    Types: Cigarettes  . Smokeless tobacco: Never Used  Substance Use Topics  . Alcohol use: Yes    Alcohol/week: 12.0 standard drinks    Types: 12 Cans of beer per week    Comment: drinks 12 pack + 1/5 wine daily  . Drug use: No     Allergies   Patient has no known allergies.   Review of Systems Review of Systems  Constitutional: Positive for fatigue and fever.  HENT: Negative for congestion, sore throat and trouble swallowing.   Eyes: Negative for visual disturbance.  Respiratory: Positive for cough, shortness of breath and wheezing.   Cardiovascular: Positive for chest pain. Negative for palpitations and leg swelling.  Gastrointestinal: Negative for abdominal pain, constipation, diarrhea, nausea and vomiting.  Genitourinary: Negative for  flank pain, frequency and hematuria.  Musculoskeletal: Negative for back pain, myalgias, neck pain and neck stiffness.  Skin: Negative for rash and wound.  Neurological: Negative for dizziness, weakness, light-headedness, numbness and headaches.  All other systems reviewed and are negative.    Physical Exam Updated Vital Signs BP 127/81   Pulse (!) 110   Temp 98.2 F (36.8 C) (Oral)   Resp 16   Ht 5\' 2"  (1.575 m)   Wt 53.1 kg   SpO2 93%   BMI 21.40 kg/m   Physical Exam Vitals signs and nursing note  reviewed.  Constitutional:      Appearance: Normal appearance. He is well-developed.  HENT:     Head: Normocephalic and atraumatic.     Nose: Nose normal.  Eyes:     Extraocular Movements: Extraocular movements intact.     Pupils: Pupils are equal, round, and reactive to light.  Neck:     Musculoskeletal: Normal range of motion and neck supple. No neck rigidity or muscular tenderness.     Comments: JVD Cardiovascular:     Rate and Rhythm: Regular rhythm.     Comments: Tachycardia Pulmonary:     Effort: Pulmonary effort is normal.     Breath sounds: Wheezing present.     Comments: Expiratory wheezing throughout. Abdominal:     General: Bowel sounds are normal.     Palpations: Abdomen is soft.     Tenderness: There is no abdominal tenderness. There is no guarding or rebound.  Musculoskeletal: Normal range of motion.        General: No tenderness or deformity.     Right lower leg: No edema.     Left lower leg: No edema.     Comments: No lower extremity swelling, asymmetry or tenderness.  Skin:    General: Skin is warm and dry.     Findings: No erythema or rash.  Neurological:     General: No focal deficit present.     Mental Status: He is alert and oriented to person, place, and time.     Comments: Moving all extremities without focal deficit.  Mildly tremulous.  Psychiatric:        Behavior: Behavior normal.      ED Treatments / Results  Labs (all labs ordered are listed, but only abnormal results are displayed) Labs Reviewed  CBC WITH DIFFERENTIAL/PLATELET - Abnormal; Notable for the following components:      Result Value   RBC 4.04 (*)    MCV 101.7 (*)    MCH 37.9 (*)    MCHC 37.2 (*)    Lymphs Abs 0.5 (*)    All other components within normal limits  COMPREHENSIVE METABOLIC PANEL - Abnormal; Notable for the following components:   Sodium 120 (*)    Chloride 83 (*)    Glucose, Bld 103 (*)    Total Bilirubin 1.5 (*)    All other components within normal  limits  TROPONIN I  BRAIN NATRIURETIC PEPTIDE    EKG EKG Interpretation  Date/Time:  Wednesday May 26 2018 16:44:57 EST Ventricular Rate:  106 PR Interval:    QRS Duration: 81 QT Interval:  333 QTC Calculation: 443 R Axis:   80 Text Interpretation:  Sinus tachycardia Confirmed by Julianne Rice [phone]) on 05/26/2018 7:28:55 PM   Radiology Dg Chest Portable 1 View  Result Date: 05/26/2018 CLINICAL DATA:  Shortness of breath for 1 week. COPD. Asthma. EXAM: PORTABLE CHEST 1 VIEW COMPARISON:  05/21/2016 FINDINGS: The heart size  and mediastinal contours are within normal limits. Elevation of right hemidiaphragm and right basilar scarring remains stable. No evidence of pulmonary infiltrate or edema. No evidence of pleural effusion. IMPRESSION: Stable elevation of right hemidiaphragm and right basilar scarring. No active disease. Electronically Signed   By: Earle Gell M.D.   On: 05/26/2018 17:40    Procedures Procedures (including critical care time)  Medications Ordered in ED Medications  LORazepam (ATIVAN) injection 0-4 mg (has no administration in time range)    Or  LORazepam (ATIVAN) tablet 0-4 mg (has no administration in time range)  LORazepam (ATIVAN) injection 0-4 mg (has no administration in time range)    Or  LORazepam (ATIVAN) tablet 0-4 mg (has no administration in time range)  thiamine (VITAMIN B-1) tablet 100 mg (has no administration in time range)    Or  thiamine (B-1) injection 100 mg (has no administration in time range)  0.9 %  sodium chloride infusion (has no administration in time range)  LORazepam (ATIVAN) injection 0.5 mg (0.5 mg Intravenous Given 05/26/18 1910)  levalbuterol (XOPENEX) nebulizer solution 1.25 mg (1.25 mg Nebulization Given 05/26/18 1939)     Initial Impression / Assessment and Plan / ED Course  I have reviewed the triage vital signs and the nursing notes.  Pertinent labs & imaging results that were available during my care of  the patient were reviewed by me and considered in my medical decision making (see chart for details).     Patient admits to drinking a 12 pack of beer daily.  Last drink this morning.  Suspect has hyponatremia related to his alcohol abuse.  Concern for early withdrawal.  Placed on CIWA protocol.  Chest pain sounds atypical for coronary artery disease.  Troponin is normal.  EKG without ischemic findings.  Wheezing has improved after nebulized treatment.  X-ray without evidence of pneumonia.  Likely COPD exacerbation.  Will discuss with hospitalist regarding admission.  Final Clinical Impressions(s) / ED Diagnoses   Final diagnoses:  COPD exacerbation ([place])  Atypical chest pain  Hyponatremia  Alcohol withdrawal syndrome without complication [facility])    ED Discharge Orders    None       Julianne Rice, MD 05/26/18 1958

## 2018-05-27 DIAGNOSIS — K219 Gastro-esophageal reflux disease without esophagitis: Secondary | ICD-10-CM

## 2018-05-27 DIAGNOSIS — E878 Other disorders of electrolyte and fluid balance, not elsewhere classified: Secondary | ICD-10-CM

## 2018-05-27 DIAGNOSIS — R17 Unspecified jaundice: Secondary | ICD-10-CM

## 2018-05-27 DIAGNOSIS — R0789 Other chest pain: Secondary | ICD-10-CM

## 2018-05-27 DIAGNOSIS — I10 Essential (primary) hypertension: Secondary | ICD-10-CM

## 2018-05-27 DIAGNOSIS — Z720 Tobacco use: Secondary | ICD-10-CM

## 2018-05-27 DIAGNOSIS — J441 Chronic obstructive pulmonary disease with (acute) exacerbation: Secondary | ICD-10-CM | POA: Diagnosis not present

## 2018-05-27 LAB — BASIC METABOLIC PANEL
Anion gap: 12 (ref 5–15)
Anion gap: 9 (ref 5–15)
BUN: 8 mg/dL (ref 6–20)
BUN: 9 mg/dL (ref 6–20)
CO2: 24 mmol/L (ref 22–32)
CO2: 24 mmol/L (ref 22–32)
CREATININE: 0.88 mg/dL (ref 0.61–1.24)
Calcium: 9 mg/dL (ref 8.9–10.3)
Calcium: 9.5 mg/dL (ref 8.9–10.3)
Chloride: 89 mmol/L — ABNORMAL LOW (ref 98–111)
Chloride: 92 mmol/L — ABNORMAL LOW (ref 98–111)
Creatinine, Ser: 0.93 mg/dL (ref 0.61–1.24)
GFR calc Af Amer: >60 mL/min (ref >60)
GFR calc non Af Amer: >60 mL/min (ref >60)
GFR calc non Af Amer: >60 mL/min (ref >60)
Glucose, Bld: 124 mg/dL — ABNORMAL HIGH (ref 70–99)
Glucose, Bld: 135 mg/dL — ABNORMAL HIGH (ref 70–99)
Potassium: 4.2 mmol/L (ref 3.5–5.1)
Potassium: 4.9 mmol/L (ref 3.5–5.1)
Sodium: 125 mmol/L — ABNORMAL LOW (ref 135–145)
Sodium: 125 mmol/L — ABNORMAL LOW (ref 135–145)

## 2018-05-27 LAB — MRSA PCR SCREENING: MRSA by PCR: NEGATIVE

## 2018-05-27 LAB — TROPONIN I
Troponin I: <0.03 ng/mL (ref <0.03)
Troponin I: <0.03 ng/mL (ref <0.03)

## 2018-05-27 LAB — SODIUM, URINE, RANDOM: Sodium, Ur: <10 mmol/L

## 2018-05-27 LAB — OSMOLALITY: Osmolality: 270 mOsm/kg — ABNORMAL LOW (ref 275–295)

## 2018-05-27 LAB — OSMOLALITY, URINE: Osmolality, Ur: 107 mOsm/kg — ABNORMAL LOW (ref 300–900)

## 2018-05-27 MED ORDER — PREDNISONE 20 MG PO TABS: 40.0000 mg | ORAL_TABLET | Freq: Every day | ORAL | 0 refills | Status: AC

## 2018-05-27 MED ORDER — CHLORDIAZEPOXIDE HCL 25 MG PO CAPS: ORAL_CAPSULE | ORAL | 0 refills | Status: DC

## 2018-05-27 MED ORDER — IPRATROPIUM-ALBUTEROL 20-100 MCG/ACT IN AERS: 1.0000 | INHALATION_SPRAY | Freq: Four times a day (QID) | RESPIRATORY_TRACT | 2 refills | Status: DC | PRN

## 2018-05-27 MED ORDER — AZITHROMYCIN 250 MG PO TABS: 250.0000 mg | ORAL_TABLET | Freq: Every day | ORAL | 0 refills | Status: AC

## 2018-05-27 NOTE — Progress Notes (Signed)
Patient was given discharge instructions. Verbalized understanding. One peripheral IV was removed. Patient was taken down via wheelchair to be discharged to home. Sister to provide transportation for patient to his home.

## 2018-05-27 NOTE — Clinical Social Work Note (Signed)
Clinical Social Work Assessment  Patient Details  Name: Jesus Walker MRN: [id number] Date of Birth: 11-06-1960  Date of referral:  05/27/18               Reason for consult:  Substance Use/ETOH Abuse                Permission sought to share information with:    Permission granted to share information::     Name::        Agency::     Relationship::     Contact Information:     Housing/Transportation Living arrangements for the past 2 months:  Single Family Home Source of Information:  Patient Patient Interpreter Needed:  None Criminal Activity/Legal Involvement Pertinent to Current Situation/Hospitalization:  No - Comment as needed Significant Relationships:  Significant Other Lives with:  Significant Other Do you feel safe going back to the place where you live?  Yes Need for family participation in patient care:  No (Coment)  Care giving concerns: Pt has high daily alcohol intake per MD.   Social Worker assessment / plan: Pt is a 57 year old male referred to CSW for provision of resources for Alcohol Treatment options. Met with pt today. Pt states that he lives with his significant other. He states that he and she both have high daily alcohol intake. Pt states that he has not tried a treatment program in the past. Pt appeared ambivalent about the idea of cutting back on his drinking. LCSW provided verbal and written information on treatment resource options in the area. Pt accepted the resources but did not verbalize any intention of utilizing them. Emotional support and encouragement of treatment follow up provided to pt.  Employment status:  Disabled (Comment on whether or not currently receiving Disability) Insurance information:  Medicaid In [place] PT Recommendations:  Not assessed at this time Information / Referral to community resources:  Residential Substance Abuse Treatment Options, Outpatient Substance Abuse Treatment Options  Patient/Family's Response to care: Pt  accepting of care.  Patient/Family's Understanding of and Emotional Response to Diagnosis, Current Treatment, and Prognosis: Pt appears to have a basic understanding of diagnosis and treatment recommendations. No emotional distress identified.  Emotional Assessment Appearance:  Appears stated age Attitude/Demeanor/Rapport:  Engaged Affect (typically observed):  Calm, Pleasant Orientation:  Oriented to Self, Oriented to Place, Oriented to  Time, Oriented to Situation Alcohol / Substance use:  Alcohol Use Psych involvement (Current and /or in the community):  No (Comment)  Discharge Needs  Concerns to be addressed:  Substance Abuse Concerns Readmission within the last 30 days:  No Current discharge risk:  Substance Abuse Barriers to Discharge:  No Barriers Identified   Shade Flood, LCSW 05/27/2018, 10:59 AM

## 2018-05-27 NOTE — Discharge Summary (Signed)
Physician Discharge Summary  [name] DXI:[id number] DOB: 01/29/1961 DOA: 05/26/2018  PCP: Rosita Fire, MD  Admit date: 05/26/2018  Discharge date: 05/27/2018  Admitted From: Home  Disposition: Home  Recommendations for Outpatient Follow-up:  1. Follow up with PCP in 1-2 weeks 2. Please obtain BMP in 1 week to reassess sodium levels which are related to alcohol use 3. Continue on prednisone for 5 more days as well as azithromycin as prescribed for 6 days 4. Continue on Combivent as needed for shortness of breath or wheezing 5. Counseled extensively on tobacco and alcohol cessation and has been given resources for alcoholism on discharge  Home Health: None  Equipment/Devices: None  Discharge Condition: Stable  CODE STATUS: Full  Diet recommendation: Heart Healthy  Brief/Interim Summary: Per HPI: Jesus Walker is a 57 y.o. male with medical history significant of alcohol dependence, asthma, COPD, GERD, hypertension presenting to the hospital for evaluation of shortness of breath and chest pain.  Patient states he has chronic shortness of breath due to COPD but does not use home oxygen.  For the past 3 days his shortness of breath has been worse and he has been wheezing and having a productive coughing.  Reports compliance with home inhalers.  Also reports having substernal/left-sided chest tightness for the past 2 days whenever he gets short of breath.  No associated diaphoresis or nausea.  Denies prior history of MI.  Denies family history of coronary artery disease/MI.  No abdominal pain or vomiting.  He drinks a 12 pack of beer daily, last drink was early this morning.  He smokes 1 pack of cigarettes per day and is currently not interested in quitting.  Patient was admitted for some mild hypoxemic respiratory failure likely secondary to COPD exacerbation, but this has improved considerably overnight and he is doing much better this morning and is now requiring any oxygen.   He only required about 1 L supplemental oxygen to begin with.  He will remain on prednisone for 5 more days as well as Combivent as needed at home and azithromycin.  He has ambulated with no need for oxygen at this time and would really like to go home.  He does have some mild hyponatremia which is attributed to his alcohol abuse and patient does not state an interest in cutting back at this time, but will be given a Librium taper as well as resources to help him quit.  No other acute events noted during the course of this brief admission.  Discharge Diagnoses:  Principal Problem:   COPD exacerbation ([employer]) Active Problems:   Hyponatremia   Alcohol dependence ([place])   Acute respiratory failure with hypoxia ([facility])   Chest tightness   Hypochloremia   Serum total bilirubin elevated   Tobacco use   HTN (hypertension)   GERD (gastroesophageal reflux disease)  Principal discharge diagnosis: Mild acute hypoxemic respiratory failure secondary to COPD exacerbation.  Discharge Instructions  Discharge Instructions    Diet - low sodium heart healthy   Complete by:  As directed    Increase activity slowly   Complete by:  As directed      Allergies as of 05/27/2018   No Known Allergies     Medication List    TAKE these medications   amLODipine 5 MG tablet Commonly known as:  NORVASC Take 5 mg by mouth daily.   azithromycin 250 MG tablet Commonly known as:  ZITHROMAX Take 1 tablet (250 mg total) by mouth daily for 5 days.  chlordiazePOXIDE 25 MG capsule Commonly known as:  LIBRIUM 25mg  tid for day one, then 25mg  bid for day two, then 25mg  x 1 dose on day three   folic acid 1 MG tablet Commonly known as:  FOLVITE Take 1 tablet (1 mg total) by mouth daily.   Ipratropium-Albuterol 20-100 MCG/ACT Aers respimat Commonly known as:  COMBIVENT RESPIMAT Inhale 1 puff into the lungs every 6 (six) hours as needed for wheezing or shortness of breath.   omeprazole 20 MG capsule Commonly  known as:  PRILOSEC Take 20 mg by mouth daily.   predniSONE 20 MG tablet Commonly known as:  DELTASONE Take 2 tablets (40 mg total) by mouth daily with breakfast for 5 days. Start taking on:  May 28, 2018   thiamine 100 MG tablet Commonly known as:  VITAMIN B-1 Take 1 tablet (100 mg total) by mouth daily.      Follow-up Information    Rosita Fire, MD Follow up in 1 week(s).   Specialty:  Internal Medicine Contact information: [place] [address] [phone]          No Known Allergies  Consultations:  None   Procedures/Studies: Dg Chest Portable 1 View  Result Date: 05/26/2018 CLINICAL DATA:  Shortness of breath for 1 week. COPD. Asthma. EXAM: PORTABLE CHEST 1 VIEW COMPARISON:  05/21/2016 FINDINGS: The heart size and mediastinal contours are within normal limits. Elevation of right hemidiaphragm and right basilar scarring remains stable. No evidence of pulmonary infiltrate or edema. No evidence of pleural effusion. IMPRESSION: Stable elevation of right hemidiaphragm and right basilar scarring. No active disease. Electronically Signed   By: Earle Gell M.D.   On: 05/26/2018 17:40     Discharge Exam: Vitals:   05/27/18 1000 05/27/18 1009  BP:    Pulse: (!) 109 (!) 118  Resp:    Temp:    SpO2: 96% 96%   Vitals:   05/27/18 0812 05/27/18 0914 05/27/18 1000 05/27/18 1009  BP:      Pulse: (!) 103  (!) 109 (!) 118  Resp:      Temp:      TempSrc:      SpO2:  95% 96% 96%  Weight:      Height:        General: Pt is alert, awake, not in acute distress Cardiovascular: RRR, S1/S2 +, no rubs, no gallops Respiratory: CTA bilaterally, no wheezing, no rhonchi Abdominal: Soft, NT, ND, bowel sounds + Extremities: no edema, no cyanosis    The results of significant diagnostics from this hospitalization (including imaging, microbiology, ancillary and laboratory) are listed below for reference.     Microbiology: Recent Results  (from the past 240 hour(s))  MRSA PCR Screening     Status: None   Collection Time: 05/27/18 12:22 AM  Result Value Ref Range Status   MRSA by PCR NEGATIVE NEGATIVE Final    Comment:        The GeneXpert MRSA Assay (FDA approved for NASAL specimens only), is one component of a comprehensive MRSA colonization surveillance program. It is not intended to diagnose MRSA infection nor to guide or monitor treatment for MRSA infections. Performed at [facility], [address], [place], [place] [address]      Labs: BNP (last 3 results) Recent Labs    05/26/18 1817  BNP 93.9   Basic Metabolic Panel: Recent Labs  Lab 05/26/18 1816 05/27/18 0009 05/27/18 0552  NA 120* 125* 125*  K 4.0 4.9 4.2  CL 83* 89* 92*  CO2 25 24 24   GLUCOSE 103* 135* 124*  BUN 7 8 9   CREATININE 0.82 0.93 0.88  CALCIUM 9.2 9.5 9.0   Liver Function Tests: Recent Labs  Lab 05/26/18 1816  AST 39  ALT 16  ALKPHOS 99  BILITOT 1.5*  PROT 7.7  ALBUMIN 3.6   No results for input(s): LIPASE, AMYLASE in the last 168 hours. No results for input(s): AMMONIA in the last 168 hours. CBC: Recent Labs  Lab 05/26/18 1816  WBC 4.7  NEUTROABS 3.9  HGB 15.3  HCT 41.1  MCV 101.7*  PLT 315   Cardiac Enzymes: Recent Labs  Lab 05/26/18 1816 05/27/18 0009 05/27/18 0552  TROPONINI <0.03 <0.03 <0.03   BNP: Invalid input(s): POCBNP CBG: No results for input(s): GLUCAP in the last 168 hours. D-Dimer No results for input(s): DDIMER in the last 72 hours. Hgb A1c No results for input(s): HGBA1C in the last 72 hours. Lipid Profile No results for input(s): CHOL, HDL, LDLCALC, TRIG, CHOLHDL, LDLDIRECT in the last 72 hours. Thyroid function studies No results for input(s): TSH, T4TOTAL, T3FREE, THYROIDAB in the last 72 hours.  Invalid input(s): FREET3 Anemia work up No results for input(s): VITAMINB12, FOLATE, FERRITIN, TIBC, IRON, RETICCTPCT in the last 72 hours. Urinalysis    Component Value  Date/Time   COLORURINE AMBER (A) 05/21/2016 1145   APPEARANCEUR CLEAR 05/21/2016 1145   LABSPEC <1.005 (L) 05/21/2016 1145   PHURINE 6.0 05/21/2016 1145   GLUCOSEU NEGATIVE 05/21/2016 1145   HGBUR NEGATIVE 05/21/2016 1145   BILIRUBINUR NEGATIVE 05/21/2016 1145   KETONESUR NEGATIVE 05/21/2016 1145   PROTEINUR NEGATIVE 05/21/2016 1145   NITRITE NEGATIVE 05/21/2016 1145   LEUKOCYTESUR NEGATIVE 05/21/2016 1145   Sepsis Labs Invalid input(s): PROCALCITONIN,  WBC,  LACTICIDVEN Microbiology Recent Results (from the past 240 hour(s))  MRSA PCR Screening     Status: None   Collection Time: 05/27/18 12:22 AM  Result Value Ref Range Status   MRSA by PCR NEGATIVE NEGATIVE Final    Comment:        The GeneXpert MRSA Assay (FDA approved for NASAL specimens only), is one component of a comprehensive MRSA colonization surveillance program. It is not intended to diagnose MRSA infection nor to guide or monitor treatment for MRSA infections. Performed at [facility], [address], [place], [place] [address]      Time coordinating discharge: 35 minutes  SIGNED:   Rodena Goldmann, DO [facility] 05/27/2018, 10:41 AM Pager [phone]  If 7PM-7AM, please contact night-coverage [email or web address] Password [email or web address]

## 2018-05-28 LAB — HIV ANTIBODY (ROUTINE TESTING W REFLEX): HIV Screen 4th Generation wRfx: NONREACTIVE

## 2018-06-10 ENCOUNTER — Encounter (HOSPITAL_COMMUNITY): Payer: Self-pay | Admitting: Emergency Medicine

## 2018-06-10 ENCOUNTER — Other Ambulatory Visit: Payer: Self-pay

## 2018-06-10 ENCOUNTER — Inpatient Hospital Stay (HOSPITAL_COMMUNITY)
Admission: EM | Admit: 2018-06-10 | Discharge: 2018-06-14 | DRG: 190 | Disposition: A | Discharge status: Home / Self Care | Payer: Medicaid Other | Attending: Internal Medicine | Admitting: Internal Medicine

## 2018-06-10 ENCOUNTER — Emergency Department (HOSPITAL_COMMUNITY): Payer: Medicaid Other

## 2018-06-10 DIAGNOSIS — J9602 Acute respiratory failure with hypercapnia: Secondary | ICD-10-CM | POA: Diagnosis present

## 2018-06-10 DIAGNOSIS — F1010 Alcohol abuse, uncomplicated: Secondary | ICD-10-CM | POA: Diagnosis present

## 2018-06-10 DIAGNOSIS — Z23 Encounter for immunization: Secondary | ICD-10-CM

## 2018-06-10 DIAGNOSIS — F17210 Nicotine dependence, cigarettes, uncomplicated: Secondary | ICD-10-CM | POA: Diagnosis present

## 2018-06-10 DIAGNOSIS — Z79899 Other long term (current) drug therapy: Secondary | ICD-10-CM

## 2018-06-10 DIAGNOSIS — R0902 Hypoxemia: Secondary | ICD-10-CM

## 2018-06-10 DIAGNOSIS — J9601 Acute respiratory failure with hypoxia: Secondary | ICD-10-CM | POA: Diagnosis present

## 2018-06-10 DIAGNOSIS — Z825 Family history of asthma and other chronic lower respiratory diseases: Secondary | ICD-10-CM

## 2018-06-10 DIAGNOSIS — I10 Essential (primary) hypertension: Secondary | ICD-10-CM

## 2018-06-10 DIAGNOSIS — Z720 Tobacco use: Secondary | ICD-10-CM

## 2018-06-10 DIAGNOSIS — Z8701 Personal history of pneumonia (recurrent): Secondary | ICD-10-CM

## 2018-06-10 DIAGNOSIS — E871 Hypo-osmolality and hyponatremia: Secondary | ICD-10-CM | POA: Diagnosis present

## 2018-06-10 DIAGNOSIS — J471 Bronchiectasis with (acute) exacerbation: Principal | ICD-10-CM | POA: Diagnosis present

## 2018-06-10 DIAGNOSIS — K219 Gastro-esophageal reflux disease without esophagitis: Secondary | ICD-10-CM | POA: Diagnosis present

## 2018-06-10 DIAGNOSIS — J441 Chronic obstructive pulmonary disease with (acute) exacerbation: Secondary | ICD-10-CM

## 2018-06-10 DIAGNOSIS — R072 Precordial pain: Secondary | ICD-10-CM

## 2018-06-10 LAB — COMPREHENSIVE METABOLIC PANEL
ALT: 21 U/L (ref 0–44)
AST: 36 U/L (ref 15–41)
Albumin: 4 g/dL (ref 3.5–5.0)
Alkaline Phosphatase: 89 U/L (ref 38–126)
Anion gap: 12 (ref 5–15)
BUN: 7 mg/dL (ref 6–20)
CO2: 25 mmol/L (ref 22–32)
Calcium: 8.9 mg/dL (ref 8.9–10.3)
Chloride: 82 mmol/L — ABNORMAL LOW (ref 98–111)
Creatinine, Ser: 0.87 mg/dL (ref 0.61–1.24)
GFR calc Af Amer: >60 mL/min (ref >60)
GFR calc non Af Amer: >60 mL/min (ref >60)
Glucose, Bld: 91 mg/dL (ref 70–99)
Potassium: 3.6 mmol/L (ref 3.5–5.1)
Sodium: 119 mmol/L — CL (ref 135–145)
Total Bilirubin: 1 mg/dL (ref 0.3–1.2)
Total Protein: 7.9 g/dL (ref 6.5–8.1)

## 2018-06-10 LAB — INFLUENZA PANEL BY PCR (TYPE A & B)
Influenza A By PCR: NEGATIVE
Influenza B By PCR: NEGATIVE

## 2018-06-10 LAB — CBC WITH DIFFERENTIAL/PLATELET
Abs Immature Granulocytes: 0.01 10*3/uL (ref 0.00–0.07)
Basophils Absolute: 0 10*3/uL (ref 0.0–0.1)
Basophils Relative: 1 %
Eosinophils Absolute: 0.7 10*3/uL — ABNORMAL HIGH (ref 0.0–0.5)
Eosinophils Relative: 12 %
HCT: 40.2 % (ref 39.0–52.0)
Hemoglobin: 14.3 g/dL (ref 13.0–17.0)
Immature Granulocytes: 0 %
Lymphocytes Relative: 26 %
Lymphs Abs: 1.5 10*3/uL (ref 0.7–4.0)
MCH: 36.8 pg — ABNORMAL HIGH (ref 26.0–34.0)
MCHC: 35.6 g/dL (ref 30.0–36.0)
MCV: 103.3 fL — ABNORMAL HIGH (ref 80.0–100.0)
MONO ABS: 0.6 10*3/uL (ref 0.1–1.0)
Monocytes Relative: 10 %
NEUTROS ABS: 3 10*3/uL (ref 1.7–7.7)
Neutrophils Relative %: 51 %
Platelets: 207 10*3/uL (ref 150–400)
RBC: 3.89 MIL/uL — AB (ref 4.22–5.81)
RDW: 11.4 % — ABNORMAL LOW (ref 11.5–15.5)
WBC: 5.8 10*3/uL (ref 4.0–10.5)
nRBC: 0 % (ref 0.0–0.2)

## 2018-06-10 LAB — BLOOD GAS, VENOUS
Acid-Base Excess: 1.9 mmol/L (ref 0.0–2.0)
Bicarbonate: 23.2 mmol/L (ref 20.0–28.0)
FIO2: 0.4
O2 Saturation: 49.7 %
PATIENT TEMPERATURE: 37
pCO2, Ven: 64.4 mmHg — ABNORMAL HIGH (ref 44.0–60.0)
pH, Ven: 7.265 (ref 7.250–7.430)
pO2, Ven: 34.4 mmHg (ref 32.0–45.0)

## 2018-06-10 LAB — BRAIN NATRIURETIC PEPTIDE: B Natriuretic Peptide: 24 pg/mL (ref 0.0–100.0)

## 2018-06-10 MED ORDER — ALBUTEROL (5 MG/ML) CONTINUOUS INHALATION SOLN
INHALATION_SOLUTION | RESPIRATORY_TRACT | Status: AC
  Administered 2018-06-10: 15 mg via RESPIRATORY_TRACT
  Filled 2018-06-10: qty 20

## 2018-06-10 MED ORDER — SODIUM CHLORIDE 0.9 % IV BOLUS
500.0000 mL | Freq: Once | INTRAVENOUS | Status: AC
  Administered 2018-06-10: 500 mL via INTRAVENOUS

## 2018-06-10 MED ORDER — ALBUTEROL (5 MG/ML) CONTINUOUS INHALATION SOLN
15.0000 mg | INHALATION_SOLUTION | Freq: Once | RESPIRATORY_TRACT | Status: AC
  Administered 2018-06-10: 15 mg via RESPIRATORY_TRACT

## 2018-06-10 MED ORDER — LACTATED RINGERS IV SOLN
INTRAVENOUS | Status: DC
  Administered 2018-06-10: via INTRAVENOUS

## 2018-06-10 NOTE — ED Provider Notes (Signed)
Emergency Department Provider Note   I have reviewed the triage vital signs and the nursing notes.   HISTORY  Chief Complaint Shortness of Breath   HPI Jesus Walker is a 58 y.o. male with PMH of EtOH use, asthma, COPD, GERD, and HTN presents to the emergency department by EMS with respiratory distress.  The patient has had 2 days of progressively worsening shortness of breath with wheezing and left-sided chest pain.  Symptoms have worsened over the past several hours and EMS was called.  They report diminished lung sounds on the right with wheezing throughout.  Patient was hypoxemic into the 70s at home when they arrived.  The patient was started on a non-rebreather and given Solu-Medrol in route.  No nebulizer medications were administered.   The patient reports chest pain/tightness in the left chest which is nonradiating.  He denies any fevers or shaking chills.  No vomiting or diarrhea.  He states that he has been drinking some alcohol this evening.   Past Medical History:  Diagnosis Date  . Alcohol dependence ([place])   . Asthma   . COPD (chronic obstructive pulmonary disease) ([place])   . GERD (gastroesophageal reflux disease)   . Hypertension     Patient Active Problem List   Diagnosis Date Noted  . Chest tightness 05/27/2018  . Hypochloremia 05/27/2018  . Serum total bilirubin elevated 05/27/2018  . Tobacco use 05/27/2018  . HTN (hypertension) 05/27/2018  . GERD (gastroesophageal reflux disease) 05/27/2018  . COPD exacerbation ([place]) 05/26/2018  . Acute respiratory failure with hypoxia ([place]) 05/26/2018  . Malnutrition of moderate degree 05/23/2016  . Hyponatremia 05/21/2016  . Hypokalemia 05/21/2016  . Mild malnutrition ([place]) 05/21/2016  . Thrombocytopenia ([place]) 05/21/2016  . Seizure ([place]) 05/21/2016  . Alcohol dependence ([place]) 05/21/2016  . Tobacco dependence 05/21/2016  . Chest pain 12/26/2015  . 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 CAP (community acquired pneumonia)  12/26/2015    Past Surgical History:  Procedure Laterality Date  . BRONCHIAL BRUSHINGS  01/03/2016   Procedure: BRONCHIAL BRUSHINGS;  Surgeon: Sinda Du, MD;  Location: [facility];  Service: Cardiopulmonary;;  . BRONCHIAL WASHINGS  01/03/2016   Procedure: BRONCHIAL WASHINGS;  Surgeon: Sinda Du, MD;  Location: [facility];  Service: Cardiopulmonary;;  . FLEXIBLE BRONCHOSCOPY N/A 01/03/2016   Procedure: FLEXIBLE BRONCHOSCOPY;  Surgeon: Sinda Du, MD;  Location: [facility];  Service: Cardiopulmonary;  Laterality: N/A;    Allergies Patient has no known allergies.  Family History  Problem Relation Age of Onset  . Asthma Mother 33  . Asthma Father 12    Social History Social History   Tobacco Use  . Smoking status: Current Every Day Smoker    Packs/day: 1.00    Types: Cigarettes  . Smokeless tobacco: Never Used  Substance Use Topics  . Alcohol use: Yes    Alcohol/week: 12.0 standard drinks    Types: 12 Cans of beer per week    Comment: drinks 12 pack + 1/5 wine daily  . Drug use: No    Review of Systems  Constitutional: No fever/chills Eyes: No visual changes. ENT: No sore throat. Cardiovascular: Positive chest pain. Respiratory: Positive shortness of breath and wheezing.  Gastrointestinal: No abdominal pain.  No nausea, no vomiting.  No diarrhea.  No constipation. Genitourinary: Negative for dysuria. Musculoskeletal: Negative for back pain. Skin: Negative for rash. Neurological: Negative for headaches, focal weakness or numbness.  10-point ROS otherwise negative.  ____________________________________________   PHYSICAL EXAM:  VITAL SIGNS:  Vitals:   06/10/18 2152 06/10/18 2200  BP: (!) 150/81 (!) 162/73  Pulse: 98 (!) 102  Resp: (!) 22 (!) 22  Temp: 98.2 F (36.8 C)   SpO2: 100% 100%     Constitutional: Alert and oriented. Patient with increased WOB.  Eyes: Conjunctivae are normal.  Head: Atraumatic. Nose: No  congestion/rhinnorhea. Mouth/Throat: Mucous membranes are dry.  Neck: No stridor.   Cardiovascular: Normal rate, regular rhythm. Good peripheral circulation. Grossly normal heart sounds.   Respiratory: Increased respiratory effort. Positive retractions. Lungs with end-expiratory wheezing bilaterally. Poor air entry.  Gastrointestinal: Soft and nontender. No distention.  Musculoskeletal: No lower extremity tenderness nor edema. No gross deformities of extremities. Neurologic:  Normal speech and language. No gross focal neurologic deficits are appreciated.  Skin:  Skin is warm, dry and intact. No rash noted.  ____________________________________________   LABS (all labs ordered are listed, but only abnormal results are displayed)  Labs Reviewed  COMPREHENSIVE METABOLIC PANEL - Abnormal; Notable for the following components:      Result Value   Sodium 119 (*)    Chloride 82 (*)    All other components within normal limits  CBC WITH DIFFERENTIAL/PLATELET - Abnormal; Notable for the following components:   RBC 3.89 (*)    MCV 103.3 (*)    MCH 36.8 (*)    RDW 11.4 (*)    Eosinophils Absolute 0.7 (*)    All other components within normal limits  BLOOD GAS, VENOUS - Abnormal; Notable for the following components:   pCO2, Ven 64.4 (*)    All other components within normal limits  BRAIN NATRIURETIC PEPTIDE  INFLUENZA PANEL BY PCR (TYPE A & B)  I-STAT TROPONIN, ED   ____________________________________________  EKG   EKG Interpretation  Date/Time:    Ventricular Rate:  111 PR Interval:    QRS Duration: 76 QT Interval:  332 QTC Calculation: 452 R Axis:   86 Text Interpretation:  Sinus tachycardia Minimal ST elevation, inferior leads No STEMI. Similar to Dec 2019 tracing.  Confirmed by Nanda Quinton [phone]) on 06/10/2018 11:29:50 PM       ____________________________________________  RADIOLOGY  Dg Chest Portable 1 View  Result Date: 06/10/2018 CLINICAL DATA:  Shortness of  breath and chest discomfort since yesterday. History of hypertension, asthma, current smoker. EXAM: PORTABLE CHEST 1 VIEW COMPARISON:  05/26/2018 FINDINGS: Normal heart size and pulmonary vascularity. Elevation of right hemidiaphragm is unchanged since prior study. Lungs appear clear and expanded. No airspace disease or consolidation. No blunting of costophrenic angles. No pneumothorax. Mediastinal contours appear intact. IMPRESSION: No active disease. Electronically Signed   By: Lucienne Capers M.D.   On: 06/10/2018 22:35    ____________________________________________   PROCEDURES  Procedure(s) performed:   Procedures  CRITICAL CARE Performed by: Margette Fast Total critical care time: 45 minutes Critical care time was exclusive of separately billable procedures and treating other patients. Critical care was necessary to treat or prevent imminent or life-threatening deterioration. Critical care was time spent personally by me on the following activities: development of treatment plan with patient and/or surrogate as well as nursing, discussions with consultants, evaluation of patient's response to treatment, examination of patient, obtaining history from patient or surrogate, ordering and performing treatments and interventions, ordering and review of laboratory studies, ordering and review of radiographic studies, pulse oximetry and re-evaluation of patient's condition.  Nanda Quinton, MD Emergency Medicine  ____________________________________________   INITIAL IMPRESSION / ASSESSMENT AND PLAN / ED COURSE  Pertinent labs &  imaging results that were available during my care of the patient were reviewed by me and considered in my medical decision making (see chart for details).  Patient presents to the emergency department for evaluation of shortness of breath with left-sided chest pain.  He has a history of COPD and has significant wheezing on exam.  I do not appreciate a diminished  exam on the right.  Patient is maintaining sats on nonrebreather but I think he would benefit from continuous albuterol neb and BiPAP at this time.  Patient is calm and not panicking.  No vomiting.  Low suspicion for ACS/PE. Patient with admit around Christmas 2019 with similar presentation.   11:30 PM Patient interfacing with the BiPAP well. Significant hyponatremia here. Normal glucose. Patient appears volume down. Will start IVF. Feeling improved after nebs. Patient's pH is 7.2 with CO retention. Normal mental status.   Discussed patient's case with Hospitalist, Dr. Darrick Meigs to request admission. Patient and family (if present) updated with plan. Care transferred to Hospitalist service.  I reviewed all nursing notes, vitals, pertinent old records, EKGs, labs, imaging (as available).  ____________________________________________  FINAL CLINICAL IMPRESSION(S) / ED DIAGNOSES  Final diagnoses:  COPD exacerbation ([place])  Hypoxia  Precordial chest pain     MEDICATIONS GIVEN DURING THIS VISIT:  Medications  lactated ringers infusion (has no administration in time range)  sodium chloride 0.9 % bolus 500 mL (has no administration in time range)  albuterol (PROVENTIL,VENTOLIN) solution continuous neb (15 mg Nebulization Given 06/10/18 2145)    Note:  This document was prepared using Dragon voice recognition software and may include unintentional dictation errors.  Nanda Quinton, MD Emergency Medicine    Karilynn Carranza, [facility], MD 06/10/18 [phone]

## 2018-06-10 NOTE — ED Notes (Signed)
CRITICAL VALUE ALERT  Critical Value:  Na 119  Date & Time Notied:  06/10/2018 2255  Provider Notified: Dr. Laverta Baltimore  Orders Received/Actions taken: See chart

## 2018-06-10 NOTE — ED Triage Notes (Signed)
Pt C/O SOB that began yesterday. Pt given 125 solumedrol in route. Pt 75% on RA upon arrival to pts residence.

## 2018-06-11 ENCOUNTER — Encounter (HOSPITAL_COMMUNITY): Payer: Self-pay

## 2018-06-11 DIAGNOSIS — J441 Chronic obstructive pulmonary disease with (acute) exacerbation: Secondary | ICD-10-CM

## 2018-06-11 DIAGNOSIS — K219 Gastro-esophageal reflux disease without esophagitis: Secondary | ICD-10-CM | POA: Diagnosis present

## 2018-06-11 DIAGNOSIS — Z79899 Other long term (current) drug therapy: Secondary | ICD-10-CM | POA: Diagnosis not present

## 2018-06-11 DIAGNOSIS — F1010 Alcohol abuse, uncomplicated: Secondary | ICD-10-CM | POA: Diagnosis present

## 2018-06-11 DIAGNOSIS — R072 Precordial pain: Secondary | ICD-10-CM | POA: Diagnosis not present

## 2018-06-11 DIAGNOSIS — J9601 Acute respiratory failure with hypoxia: Secondary | ICD-10-CM

## 2018-06-11 DIAGNOSIS — J9602 Acute respiratory failure with hypercapnia: Secondary | ICD-10-CM | POA: Diagnosis present

## 2018-06-11 DIAGNOSIS — J471 Bronchiectasis with (acute) exacerbation: Secondary | ICD-10-CM | POA: Diagnosis present

## 2018-06-11 DIAGNOSIS — E871 Hypo-osmolality and hyponatremia: Secondary | ICD-10-CM | POA: Diagnosis present

## 2018-06-11 DIAGNOSIS — Z8701 Personal history of pneumonia (recurrent): Secondary | ICD-10-CM | POA: Diagnosis not present

## 2018-06-11 DIAGNOSIS — Z825 Family history of asthma and other chronic lower respiratory diseases: Secondary | ICD-10-CM | POA: Diagnosis not present

## 2018-06-11 DIAGNOSIS — F17210 Nicotine dependence, cigarettes, uncomplicated: Secondary | ICD-10-CM | POA: Diagnosis present

## 2018-06-11 DIAGNOSIS — Z23 Encounter for immunization: Secondary | ICD-10-CM | POA: Diagnosis not present

## 2018-06-11 DIAGNOSIS — I10 Essential (primary) hypertension: Secondary | ICD-10-CM | POA: Diagnosis present

## 2018-06-11 DIAGNOSIS — Z720 Tobacco use: Secondary | ICD-10-CM | POA: Diagnosis not present

## 2018-06-11 LAB — COMPREHENSIVE METABOLIC PANEL
ALBUMIN: 3.5 g/dL (ref 3.5–5.0)
ALT: 20 U/L (ref 0–44)
AST: 37 U/L (ref 15–41)
Alkaline Phosphatase: 80 U/L (ref 38–126)
Anion gap: 12 (ref 5–15)
BUN: 9 mg/dL (ref 6–20)
CO2: 23 mmol/L (ref 22–32)
CREATININE: 0.82 mg/dL (ref 0.61–1.24)
Calcium: 8.8 mg/dL — ABNORMAL LOW (ref 8.9–10.3)
Chloride: 86 mmol/L — ABNORMAL LOW (ref 98–111)
GFR calc Af Amer: >60 mL/min (ref >60)
GFR calc non Af Amer: >60 mL/min (ref >60)
Glucose, Bld: 107 mg/dL — ABNORMAL HIGH (ref 70–99)
Potassium: 4.5 mmol/L (ref 3.5–5.1)
Sodium: 121 mmol/L — ABNORMAL LOW (ref 135–145)
Total Bilirubin: 1.3 mg/dL — ABNORMAL HIGH (ref 0.3–1.2)
Total Protein: 7.1 g/dL (ref 6.5–8.1)

## 2018-06-11 LAB — CBC
HCT: 37.7 % — ABNORMAL LOW (ref 39.0–52.0)
Hemoglobin: 13.2 g/dL (ref 13.0–17.0)
MCH: 35.9 pg — ABNORMAL HIGH (ref 26.0–34.0)
MCHC: 35 g/dL (ref 30.0–36.0)
MCV: 102.4 fL — ABNORMAL HIGH (ref 80.0–100.0)
Platelets: 210 10*3/uL (ref 150–400)
RBC: 3.68 MIL/uL — AB (ref 4.22–5.81)
RDW: 11.3 % — ABNORMAL LOW (ref 11.5–15.5)
WBC: 4.6 10*3/uL (ref 4.0–10.5)
nRBC: 0 % (ref 0.0–0.2)

## 2018-06-11 LAB — TROPONIN I
Troponin I: <0.03 ng/mL (ref <0.03)
Troponin I: <0.03 ng/mL (ref <0.03)
Troponin I: <0.03 ng/mL (ref <0.03)

## 2018-06-11 LAB — OSMOLALITY: Osmolality: 256 mOsm/kg — ABNORMAL LOW (ref 275–295)

## 2018-06-11 LAB — MRSA PCR SCREENING: MRSA by PCR: NEGATIVE

## 2018-06-11 MED ORDER — FOLIC ACID 1 MG PO TABS
1.0000 mg | ORAL_TABLET | Freq: Every day | ORAL | Status: DC
  Administered 2018-06-11 – 2018-06-14 (×4): 1 mg via ORAL
  Filled 2018-06-11 (×4): qty 1

## 2018-06-11 MED ORDER — NICOTINE 21 MG/24HR TD PT24
21.0000 mg | MEDICATED_PATCH | Freq: Every day | TRANSDERMAL | Status: DC
  Administered 2018-06-11 – 2018-06-14 (×4): 21 mg via TRANSDERMAL
  Filled 2018-06-11 (×4): qty 1

## 2018-06-11 MED ORDER — ENOXAPARIN SODIUM 40 MG/0.4ML ~~LOC~~ SOLN
40.0000 mg | SUBCUTANEOUS | Status: DC
  Administered 2018-06-11 – 2018-06-13 (×4): 40 mg via SUBCUTANEOUS
  Filled 2018-06-11 (×3): qty 0.4

## 2018-06-11 MED ORDER — IPRATROPIUM-ALBUTEROL 0.5-2.5 (3) MG/3ML IN SOLN
RESPIRATORY_TRACT | Status: AC
  Filled 2018-06-11: qty 3

## 2018-06-11 MED ORDER — IPRATROPIUM BROMIDE 0.02 % IN SOLN: 0.5000 mg | Freq: Four times a day (QID) | RESPIRATORY_TRACT | Status: DC

## 2018-06-11 MED ORDER — LORAZEPAM 2 MG/ML IJ SOLN
2.0000 mg | INTRAMUSCULAR | Status: DC | PRN
  Administered 2018-06-12: 2 mg via INTRAVENOUS
  Filled 2018-06-11: qty 1

## 2018-06-11 MED ORDER — ENOXAPARIN SODIUM 40 MG/0.4ML ~~LOC~~ SOLN
SUBCUTANEOUS | Status: AC
  Administered 2018-06-11: 40 mg via SUBCUTANEOUS
  Filled 2018-06-11: qty 0.4

## 2018-06-11 MED ORDER — BUDESONIDE 0.5 MG/2ML IN SUSP
0.5000 mg | Freq: Two times a day (BID) | RESPIRATORY_TRACT | Status: DC
  Administered 2018-06-11 – 2018-06-14 (×7): 0.5 mg via RESPIRATORY_TRACT
  Filled 2018-06-11 (×15): qty 2

## 2018-06-11 MED ORDER — AMLODIPINE BESYLATE 5 MG PO TABS
5.0000 mg | ORAL_TABLET | Freq: Every day | ORAL | Status: DC
  Administered 2018-06-11 – 2018-06-12 (×2): 5 mg via ORAL
  Filled 2018-06-11 (×2): qty 1

## 2018-06-11 MED ORDER — CHLORHEXIDINE GLUCONATE 0.12 % MT SOLN
15.0000 mL | Freq: Two times a day (BID) | OROMUCOSAL | Status: DC
  Administered 2018-06-11 – 2018-06-14 (×7): 15 mL via OROMUCOSAL
  Filled 2018-06-11 (×13): qty 15

## 2018-06-11 MED ORDER — METHYLPREDNISOLONE SODIUM SUCC 125 MG IJ SOLR
INTRAMUSCULAR | Status: AC
  Administered 2018-06-11: 60 mg via INTRAVENOUS
  Filled 2018-06-11: qty 2

## 2018-06-11 MED ORDER — SODIUM CHLORIDE 0.9 % IV SOLN
INTRAVENOUS | Status: DC
  Administered 2018-06-11: 02:00:00 via INTRAVENOUS

## 2018-06-11 MED ORDER — ARFORMOTEROL TARTRATE 15 MCG/2ML IN NEBU
15.0000 ug | INHALATION_SOLUTION | Freq: Two times a day (BID) | RESPIRATORY_TRACT | Status: DC
  Administered 2018-06-11 – 2018-06-14 (×7): 15 ug via RESPIRATORY_TRACT
  Filled 2018-06-11 (×13): qty 2

## 2018-06-11 MED ORDER — IPRATROPIUM-ALBUTEROL 0.5-2.5 (3) MG/3ML IN SOLN
3.0000 mL | Freq: Four times a day (QID) | RESPIRATORY_TRACT | Status: DC
  Administered 2018-06-11 (×4): 3 mL via RESPIRATORY_TRACT
  Filled 2018-06-11 (×3): qty 3

## 2018-06-11 MED ORDER — METHYLPREDNISOLONE SODIUM SUCC 125 MG IJ SOLR
60.0000 mg | Freq: Four times a day (QID) | INTRAMUSCULAR | Status: DC
  Administered 2018-06-11 – 2018-06-14 (×14): 60 mg via INTRAVENOUS
  Filled 2018-06-11 (×13): qty 2

## 2018-06-11 MED ORDER — ALBUTEROL SULFATE (2.5 MG/3ML) 0.083% IN NEBU: 2.5000 mg | INHALATION_SOLUTION | Freq: Four times a day (QID) | RESPIRATORY_TRACT | Status: DC

## 2018-06-11 MED ORDER — ORAL CARE MOUTH RINSE
15.0000 mL | Freq: Two times a day (BID) | OROMUCOSAL | Status: DC
  Administered 2018-06-11 – 2018-06-13 (×4): 15 mL via OROMUCOSAL

## 2018-06-11 MED ORDER — VITAMIN B-1 100 MG PO TABS
100.0000 mg | ORAL_TABLET | Freq: Every day | ORAL | Status: DC
  Administered 2018-06-11 – 2018-06-14 (×4): 100 mg via ORAL
  Filled 2018-06-11 (×4): qty 1

## 2018-06-11 MED ORDER — GUAIFENESIN ER 600 MG PO TB12
600.0000 mg | ORAL_TABLET | Freq: Two times a day (BID) | ORAL | Status: DC
  Administered 2018-06-11 – 2018-06-14 (×7): 600 mg via ORAL
  Filled 2018-06-11 (×12): qty 1

## 2018-06-11 MED ORDER — PANTOPRAZOLE SODIUM 40 MG PO TBEC
40.0000 mg | DELAYED_RELEASE_TABLET | Freq: Every day | ORAL | Status: DC
  Administered 2018-06-11 – 2018-06-14 (×4): 40 mg via ORAL
  Filled 2018-06-11 (×4): qty 1

## 2018-06-11 MED ORDER — PNEUMOCOCCAL VAC POLYVALENT 25 MCG/0.5ML IJ INJ
0.5000 mL | INJECTION | INTRAMUSCULAR | Status: AC
  Administered 2018-06-12: 0.5 mL via INTRAMUSCULAR
  Filled 2018-06-11: qty 0.5

## 2018-06-11 NOTE — Progress Notes (Signed)
Pt transported from ED 2 to [place] without incident.

## 2018-06-11 NOTE — Progress Notes (Signed)
Patient seen and examined.  Admitted after midnight secondary to acute respiratory failure with hypoxia in the setting of COPD exacerbation.  Patient presented very tachypneic, with diffuse expiratory wheezing and oxygen saturation in the mid 70s on room air.  Patient required to be placed on BiPAP for further stabilization of his respiratory status.  He has responded well and at this moment has been taking off BiPAP to further evaluate stability.  He denies chest pain, nausea, vomiting, chills, fever, or any other complaints.  Stable blood pressure.  Please refer to H&P written by Dr. Darrick Meigs for further info/details.  Plan: -Admit to stepdown -Continue steroids, DuoNeb and oxygen supplementation -Patient will be started on flutter valve, Pulmicort and Brovana -Given history of alcohol abuse we will continue the use of thiamine, folic acid and CIWA protocol. -Follow clinical response. -Tobacco cessation counseling has been provided and patient started on nicotine patch.  Barton Dubois MD [phone]

## 2018-06-11 NOTE — H&P (Signed)
TRH H&P    Patient Demographics:    Alegandro Macnaughton, is a 58 y.o. male  MRN: [id number]  DOB - Feb 18, 1961  Admit Date - 06/10/2018  Referring MD/NP/PA: Nanda Quinton  Outpatient Primary MD for the patient is Rosita Fire, MD  Patient coming from: Home  Chief complaint-shortness of breath   HPI:    Zaniel Marineau  is a 58 y.o. male, with history of alcohol abuse, asthma, COPD, GERD, hypertension, chronic hyponatremia came to ED with complaints of shortness of breath.  Patient says that for past 2 days he has been having worsening shortness of breath.  Also complains of left-sided chest pain.  As symptoms had worsened EMS was called.  He was hypoxemic with O2 sats 70% on room air.  In the ED patient was put on BiPAP. He denies nausea vomiting or diarrhea. Denies abdominal pain. He drinks beer every day    Review of systems:    In addition to the HPI above,    All other systems reviewed and are negative.    Past History of the following :    Past Medical History:  Diagnosis Date  . Alcohol dependence ([place])   . Asthma   . COPD (chronic obstructive pulmonary disease) ([place])   . GERD (gastroesophageal reflux disease)   . Hypertension       Past Surgical History:  Procedure Laterality Date  . BRONCHIAL BRUSHINGS  01/03/2016   Procedure: BRONCHIAL BRUSHINGS;  Surgeon: Sinda Du, MD;  [facility];  Service: Cardiopulmonary;;  . BRONCHIAL WASHINGS  01/03/2016   Procedure: BRONCHIAL WASHINGS;  Surgeon: Sinda Du, MD;  [facility];  Service: Cardiopulmonary;;  . FLEXIBLE BRONCHOSCOPY N/A 01/03/2016   Procedure: FLEXIBLE BRONCHOSCOPY;  Surgeon: Sinda Du, MD;  Location: [facility];  Service: Cardiopulmonary;  Laterality: N/A;      Social History:      Social History   Tobacco Use  . Smoking status: Current Every Day Smoker    Packs/day: 1.00    Types: Cigarettes   . Smokeless tobacco: Never Used  Substance Use Topics  . Alcohol use: Yes    Alcohol/week: 12.0 standard drinks    Types: 12 Cans of beer per week    Comment: drinks 12 pack + 1/5 wine daily       Family History :     Family History  Problem Relation Age of Onset  . Asthma Mother 29  . Asthma Father 27      Home Medications:   Prior to Admission medications   Medication Sig Start Date End Date Taking? Authorizing Provider  amLODipine (NORVASC) 5 MG tablet Take 5 mg by mouth daily.    [provider]  chlordiazePOXIDE (LIBRIUM) 25 MG capsule 25mg  tid for day one, then 25mg  bid for day two, then 25mg  x 1 dose on day three 05/27/18   Manuella Ghazi, Pratik D, DO  folic acid (FOLVITE) 1 MG tablet Take 1 tablet (1 mg total) by mouth daily. 05/28/16   Rosita Fire, MD  Ipratropium-Albuterol (COMBIVENT  RESPIMAT) 20-100 MCG/ACT AERS respimat Inhale 1 puff into the lungs every 6 (six) hours as needed for wheezing or shortness of breath. 05/27/18   Manuella Ghazi, Pratik D, DO  omeprazole (PRILOSEC) 20 MG capsule Take 20 mg by mouth daily.    [provider]  thiamine (VITAMIN B-1) 100 MG tablet Take 1 tablet (100 mg total) by mouth daily. 05/28/16   Rosita Fire, MD     Allergies:    No Known Allergies   Physical Exam:   Vitals  Blood pressure (!) 162/73, pulse (!) 111, temperature 98.2 F (36.8 C), temperature source Oral, resp. rate (!) 25, SpO2 100 %.  1.  General: Appears in no acute distress, BiPAP  2. Psychiatric:  Intact judgement and  insight, awake alert, oriented x 3.  3. Neurologic: No focal neurological deficits, all cranial nerves intact.Strength 5/5 all 4 extremities, sensation intact all 4 extremities, plantars down going.  4. Eyes :  anicteric sclerae, moist conjunctivae with no lid lag. PERRLA.  5. ENMT:  Patient on BiPAP  6. Neck:  supple, no cervical lymphadenopathy appriciated, No thyromegaly  7. Respiratory : Bilateral rhonchi  auscultated  8. Cardiovascular : RRR, no gallops, rubs or murmurs, no leg edema  9. Gastrointestinal:  Positive bowel sounds, abdomen soft, non-tender to palpation,no hepatosplenomegaly, no rigidity or guarding       10. Skin:  No cyanosis, normal texture and turgor, no rash, lesions or ulcers  11.Musculoskeletal:  Good muscle tone,  joints appear normal , no effusions,  normal range of motion    Data Review:    CBC Recent Labs  Lab 06/10/18 2221  WBC 5.8  HGB 14.3  HCT 40.2  PLT 207  MCV 103.3*  MCH 36.8*  MCHC 35.6  RDW 11.4*  LYMPHSABS 1.5  MONOABS 0.6  EOSABS 0.7*  BASOSABS 0.0   ------------------------------------------------------------------------------------------------------------------  Results for orders placed or performed during the hospital encounter of 06/10/18 (from the past 48 hour(s))  Comprehensive metabolic panel     Status: Abnormal   Collection Time: 06/10/18 10:21 PM  Result Value Ref Range   Sodium 119 (LL) 135 - 145 mmol/L    Comment: CRITICAL RESULT CALLED TO, READ BACK BY AND VERIFIED WITH: M DOSS,RN @2257  06/10/18 MKELLY    Potassium 3.6 3.5 - 5.1 mmol/L   Chloride 82 (L) 98 - 111 mmol/L   CO2 25 22 - 32 mmol/L   Glucose, Bld 91 70 - 99 mg/dL   BUN 7 6 - 20 mg/dL   Creatinine, Ser 0.87 0.61 - 1.24 mg/dL   Calcium 8.9 8.9 - 10.3 mg/dL   Total Protein 7.9 6.5 - 8.1 g/dL   Albumin 4.0 3.5 - 5.0 g/dL   AST 36 15 - 41 U/L   ALT 21 0 - 44 U/L   Alkaline Phosphatase 89 38 - 126 U/L   Total Bilirubin 1.0 0.3 - 1.2 mg/dL   GFR calc non Af Amer >60 >60 mL/min   GFR calc Af Amer >60 >60 mL/min   Anion gap 12 5 - 15    Comment: Performed at [facility], [address] [place], [place] [address]  Brain natriuretic peptide     Status: None   Collection Time: 06/10/18 10:21 PM  Result Value Ref Range   B Natriuretic Peptide 24.0 0.0 - 100.0 pg/mL    Comment: Performed at [facility], [address] [place], [place] [address]  CBC with  Differential     Status: Abnormal  Collection Time: 06/10/18 10:21 PM  Result Value Ref Range   WBC 5.8 4.0 - 10.5 K/uL   RBC 3.89 (L) 4.22 - 5.81 MIL/uL   Hemoglobin 14.3 13.0 - 17.0 g/dL   HCT 40.2 39.0 - 52.0 %   MCV 103.3 (H) 80.0 - 100.0 fL   MCH 36.8 (H) 26.0 - 34.0 pg   MCHC 35.6 30.0 - 36.0 g/dL   RDW 11.4 (L) 11.5 - 15.5 %   Platelets 207 150 - 400 K/uL   nRBC 0.0 0.0 - 0.2 %   Neutrophils Relative % 51 %   Neutro Abs 3.0 1.7 - 7.7 K/uL   Lymphocytes Relative 26 %   Lymphs Abs 1.5 0.7 - 4.0 K/uL   Monocytes Relative 10 %   Monocytes Absolute 0.6 0.1 - 1.0 K/uL   Eosinophils Relative 12 %   Eosinophils Absolute 0.7 (H) 0.0 - 0.5 K/uL   Basophils Relative 1 %   Basophils Absolute 0.0 0.0 - 0.1 K/uL   Immature Granulocytes 0 %   Abs Immature Granulocytes 0.01 0.00 - 0.07 K/uL    Comment: Performed at [facility], [address] [place], [place] [address]  Blood gas, venous     Status: Abnormal   Collection Time: 06/10/18 10:21 PM  Result Value Ref Range   FIO2 0.40    pH, Ven 7.265 7.250 - 7.430   pCO2, Ven 64.4 (H) 44.0 - 60.0 mmHg   pO2, Ven 34.4 32.0 - 45.0 mmHg   Bicarbonate 23.2 20.0 - 28.0 mmol/L   Acid-Base Excess 1.9 0.0 - 2.0 mmol/L   O2 Saturation 49.7 %   Patient temperature 37.0     Comment: Performed at [facility], [address], [place], [place] [address]  Influenza panel by PCR (type A & B)     Status: None   Collection Time: 06/10/18 10:40 PM  Result Value Ref Range   Influenza A By PCR NEGATIVE NEGATIVE   Influenza B By PCR NEGATIVE NEGATIVE    Comment: (NOTE) The Xpert Xpress Flu assay is intended as an aid in the diagnosis of  influenza and should not be used as a sole basis for treatment.  This  assay is FDA approved for nasopharyngeal swab specimens only. Nasal  washings and aspirates are unacceptable for Xpert Xpress Flu testing. Performed at [facility], [address] [place], [place] [address]     Chemistries  Recent Labs  Lab  06/10/18 2221  NA 119*  K 3.6  CL 82*  CO2 25  GLUCOSE 91  BUN 7  CREATININE 0.87  CALCIUM 8.9  AST 36  ALT 21  ALKPHOS 89  BILITOT 1.0   ------------------------------------------------------------------------------------------------------------------  ------------------------------------------------------------------------------------------------------------------ GFR: CrCl cannot be calculated (Unknown ideal weight.). Liver Function Tests: Recent Labs  Lab 06/10/18 2221  AST 36  ALT 21  ALKPHOS 89  BILITOT 1.0  PROT 7.9  ALBUMIN 4.0   No results for input(s): LIPASE, AMYLASE in the last 168 hours. No results for input(s): AMMONIA in the last 168 hours. Coagulation Profile: No results for input(s): INR, PROTIME in the last 168 hours. Cardiac Enzymes: No results for input(s): CKTOTAL, CKMB, CKMBINDEX, TROPONINI in the last 168 hours. BNP (last 3 results) No results for input(s): PROBNP in the last 8760 hours. HbA1C: No results for input(s): HGBA1C in the last 72 hours. CBG: No results for input(s): GLUCAP in the last 168 hours. Lipid Profile: No results for input(s): CHOL, HDL, LDLCALC, TRIG, CHOLHDL, LDLDIRECT in the  last 72 hours. Thyroid Function Tests: No results for input(s): TSH, T4TOTAL, FREET4, T3FREE, THYROIDAB in the last 72 hours. Anemia Panel: No results for input(s): VITAMINB12, FOLATE, FERRITIN, TIBC, IRON, RETICCTPCT in the last 72 hours.  --------------------------------------------------------------------------------------------------------------- Urine analysis:    Component Value Date/Time   COLORURINE AMBER (A) 05/21/2016 1145   APPEARANCEUR CLEAR 05/21/2016 1145   LABSPEC <1.005 (L) 05/21/2016 1145   PHURINE 6.0 05/21/2016 1145   GLUCOSEU NEGATIVE 05/21/2016 1145   HGBUR NEGATIVE 05/21/2016 1145   BILIRUBINUR NEGATIVE 05/21/2016 1145   KETONESUR NEGATIVE 05/21/2016 1145   PROTEINUR NEGATIVE 05/21/2016 1145   NITRITE NEGATIVE  05/21/2016 1145   LEUKOCYTESUR NEGATIVE 05/21/2016 1145      Imaging Results:    Dg Chest Portable 1 View  Result Date: 06/10/2018 CLINICAL DATA:  Shortness of breath and chest discomfort since yesterday. History of hypertension, asthma, current smoker. EXAM: PORTABLE CHEST 1 VIEW COMPARISON:  05/26/2018 FINDINGS: Normal heart size and pulmonary vascularity. Elevation of right hemidiaphragm is unchanged since prior study. Lungs appear clear and expanded. No airspace disease or consolidation. No blunting of costophrenic angles. No pneumothorax. Mediastinal contours appear intact. IMPRESSION: No active disease. Electronically Signed   By: Lucienne Capers M.D.   On: 06/10/2018 22:35    My personal review of EKG: Rhythm NSR   Assessment & Plan:    Active Problems:   Acute respiratory failure with hypoxia (HCC)   1. Acute hypoxic and hypercapnic respiratory failure-due to underlying COPD, patient is on BiPAP.  Venous BG showed PCO2 64.4, PO2 34.4.  Continue duo nebs every 6 hours, will start Solu-Medrol 60 mg IV every 6 hours,.  Mucinex 1 tablet p.o. twice daily.  2. Chest tightness-patient complains of chest tightness associated with coughing.  Will cycle troponin every 6 hours x 3  3. Hyponatremia-chronically low sodium since 2017.  Likely due to daily beer use.  Today sodium was 119.  Will check serum and urine osmolality.  4. Alcohol abuse-patient drinks beer every day.  Will start CIWA protocol.  5. Hypertension-blood pressure stable, continue amlodipine   DVT Prophylaxis-   Lovenox   AM Labs Ordered, also please review Full Orders  Family Communication: Admission, patients condition and plan of care including tests being ordered have been discussed with the patient  who indicate understanding and agree with the plan and Code Status.  Code Status: Full code  Admission status: Inpatient: Based on patients clinical presentation and evaluation of above clinical data, I have made  determination that patient meets Inpatient criteria at this time.  Patient will require more than 2 midnight stay in the hospital.  Currently he is on BiPAP started on IV Solu-Medrol for COPD exacerbation.  Time spent in minutes : 60 minutes   Oswald Hillock M.D on 06/11/2018 at 1:10 AM  Between 7am to 7pm - Pager - [phone]. After 7pm go to [email or web address] - password [facility]   [facility] - Office  [phone]

## 2018-06-11 NOTE — Progress Notes (Signed)
Went to give patient his nebulizer treatment. Spoke with RN. He stated he had removed patient from the Bipap to let him rest for a bit. The patient was on 2lpm SPO2 was 90%. He was not in any distress and looked comfortable. After his treatment. I placed him back on Nasal cannula at 3lpm and had him perform the flutter valve x10. No sputum production noted at this time.  SPO2 remained around 93%. RT and RN will cont communication over necessity of BIPAP.

## 2018-06-11 NOTE — ED Notes (Signed)
Report to Jennifer, RN

## 2018-06-11 NOTE — Progress Notes (Signed)
Called by RN to replace bipap back on pt. He looks somewhat stable. His HR is 120s, SPO2 90-91%, and his RR in low 20s. I spoke with the patient and he stated he wanted to be placed back on the BIPAP. He is on and comfortable at this time. Previous settings used.

## 2018-06-12 DIAGNOSIS — F1010 Alcohol abuse, uncomplicated: Secondary | ICD-10-CM

## 2018-06-12 DIAGNOSIS — K219 Gastro-esophageal reflux disease without esophagitis: Secondary | ICD-10-CM

## 2018-06-12 DIAGNOSIS — E871 Hypo-osmolality and hyponatremia: Secondary | ICD-10-CM

## 2018-06-12 MED ORDER — DOXYCYCLINE HYCLATE 100 MG PO TABS
100.0000 mg | ORAL_TABLET | Freq: Two times a day (BID) | ORAL | Status: DC
  Administered 2018-06-12 – 2018-06-14 (×4): 100 mg via ORAL
  Filled 2018-06-12 (×4): qty 1

## 2018-06-12 MED ORDER — IPRATROPIUM-ALBUTEROL 0.5-2.5 (3) MG/3ML IN SOLN
3.0000 mL | Freq: Four times a day (QID) | RESPIRATORY_TRACT | Status: DC
  Administered 2018-06-12 – 2018-06-14 (×9): 3 mL via RESPIRATORY_TRACT
  Filled 2018-06-12 (×9): qty 3

## 2018-06-12 NOTE — Progress Notes (Signed)
Patient is OFF BiPAP and on oxygen. Most likely will leave off unless needed.

## 2018-06-12 NOTE — Progress Notes (Signed)
PROGRESS NOTE    XZAIVER VAYDA  UJW:[id number] DOB: 1960-09-09 DOA: 06/10/2018 PCP: Rosita Fire, MD     Brief Narrative:  58 y.o. male, with history of alcohol abuse, asthma, COPD, GERD, hypertension, chronic hyponatremia came to ED with complaints of shortness of breath.  Patient says that for past 2 days he has been having worsening shortness of breath.  Also complains of left-sided chest pain.  As symptoms had worsened EMS was called.  He was hypoxemic with O2 sats 70% on room air.  In the ED patient was put on BiPAP.  Assessment & Plan: 1-acute respiratory failure with hypoxia ([place]): COPD exacerbation. -Patient has required the use of BiPAP since he hit the ED altered approximately 16 hours ago. -Now stable on 3 L nasal cannula supplementation; evaluate the need of oxygen supplementation at rest/exertion prior to discharge. -Will continue IV steroids, Brovana, Pulmicort, DuoNeb, Mucinex and flutter valve -Smoking cessation has been provided -Continue supportive care -Follow clinical response. -Due to component of bronchiectasis patient has been started on doxycycline.  2-hypertension -Continue amlodipine.  3-hyponatremia/alcohol abuse -Fluid resuscitation provided -Follow electrolytes trend. -Patient low sodium most likely associated with chronic alcohol abuse -Cessation counseling has been provided -Will continue CIWA protocol -No active signs of withdrawal at this point. -Continue thiamine and folic acid.  4-GERD/GI prophylaxis -continue PPI  DVT prophylaxis: Lovenox Code Status: Full code Family Communication: Sister and girlfriend at bedside. Disposition Plan: Remains inpatient.  If he remains stable throughout the day will move to telemetry bed this afternoon.  Continue nebulizer management, CIWA protocol, IV steroids, antibiotics, oxygen supplementation and supportive care.  Consultants:   None  Procedures:   See below for x-ray report.  Antimicrobials:    Anti-infectives (From admission, onward)   None       Subjective: Afebrile, no nausea, no vomiting.  Patient reports on intermediate pleuritic chest discomfort while using flutter valve.  Still short of breath with minimal exertion and having diffuse wheezing.  Oxygen supplementation through nasal cannula provided with good O2 sat at this point.  Objective: Vitals:   06/12/18 1300 06/12/18 1400 06/12/18 1459 06/12/18 1500  BP: (!) 110/59 107/62  114/63  Pulse: (!) 110 (!) 107  (!) 116  Resp: (!) 22 19  15   Temp:      TempSrc:      SpO2: 99% 99% 96% 96%  Weight:      Height:        Intake/Output Summary (Last 24 hours) at 06/12/2018 1642 Last data filed at 06/12/2018 1300 Gross per 24 hour  Intake 939.94 ml  Output 825 ml  Net 114.94 ml   Filed Weights   06/11/18 0700 06/11/18 1302 06/12/18 0429  Weight: 50.8 kg 53.3 kg 53.5 kg    Examination: General exam: Alert, awake, oriented x 3; reports feeling better.  Has not required BiPAP over the last 18 hours.  Still short of breath with minimal exertion and having diffuse wheezing.  Patient with some difficulty speaking in full sentences. Respiratory system: Diffuse expiratory wheezing (left more than right), positive rhonchi right, no using accessory muscles.  Adequate oxygen saturation on 3 L nasal cannula supplementation. Cardiovascular system: Mild sinus tachycardia, no rubs, no gallops, no murmurs.  No JVD on exam.   Gastrointestinal system: Abdomen is nondistended, soft and nontender. No organomegaly or masses felt. Normal bowel sounds heard. Central nervous system: Alert and oriented. No focal neurological deficits. Extremities: No C/C/E, +pedal pulses Skin: No rashes, lesions or ulcers  Psychiatry: Judgement and insight appear normal. Mood & affect appropriate.     Data Reviewed: I have personally reviewed following labs and imaging studies  CBC: Recent Labs  Lab 06/10/18 2221 06/11/18 0348  WBC 5.8 4.6   NEUTROABS 3.0  --   HGB 14.3 13.2  HCT 40.2 37.7*  MCV 103.3* 102.4*  PLT 207 161   Basic Metabolic Panel: Recent Labs  Lab 06/10/18 2221 06/11/18 0348  NA 119* 121*  K 3.6 4.5  CL 82* 86*  CO2 25 23  GLUCOSE 91 107*  BUN 7 9  CREATININE 0.87 0.82  CALCIUM 8.9 8.8*   GFR: Estimated Creatinine Clearance: 73.4 mL/min (by C-G formula based on SCr of 0.82 mg/dL).   Liver Function Tests: Recent Labs  Lab 06/10/18 2221 06/11/18 0348  AST 36 37  ALT 21 20  ALKPHOS 89 80  BILITOT 1.0 1.3*  PROT 7.9 7.1  ALBUMIN 4.0 3.5   Cardiac Enzymes: Recent Labs  Lab 06/11/18 0348 06/11/18 0949 06/11/18 1623  TROPONINI <0.03 <0.03 <0.03   Urine analysis:    Component Value Date/Time   COLORURINE AMBER (A) 05/21/2016 1145   APPEARANCEUR CLEAR 05/21/2016 1145   LABSPEC <1.005 (L) 05/21/2016 1145   PHURINE 6.0 05/21/2016 1145   GLUCOSEU NEGATIVE 05/21/2016 1145   HGBUR NEGATIVE 05/21/2016 1145   BILIRUBINUR NEGATIVE 05/21/2016 1145   KETONESUR NEGATIVE 05/21/2016 1145   PROTEINUR NEGATIVE 05/21/2016 1145   NITRITE NEGATIVE 05/21/2016 1145   LEUKOCYTESUR NEGATIVE 05/21/2016 1145    Recent Results (from the past 240 hour(s))  MRSA PCR Screening     Status: None   Collection Time: 06/11/18 12:46 PM  Result Value Ref Range Status   MRSA by PCR NEGATIVE NEGATIVE Final    Comment:        The GeneXpert MRSA Assay (FDA approved for NASAL specimens only), is one component of a comprehensive MRSA colonization surveillance program. It is not intended to diagnose MRSA infection nor to guide or monitor treatment for MRSA infections. Performed at [facility], [address] [place],  [address]      Radiology Studies: Dg Chest Portable 1 View  Result Date: 06/10/2018 CLINICAL DATA:  Shortness of breath and chest discomfort since yesterday. History of hypertension, asthma, current smoker. EXAM: PORTABLE CHEST 1 VIEW COMPARISON:  05/26/2018 FINDINGS: Normal heart size  and pulmonary vascularity. Elevation of right hemidiaphragm is unchanged since prior study. Lungs appear clear and expanded. No airspace disease or consolidation. No blunting of costophrenic angles. No pneumothorax. Mediastinal contours appear intact. IMPRESSION: No active disease. Electronically Signed   By: Lucienne Capers M.D.   On: 06/10/2018 22:35    Scheduled Meds: . amLODipine  5 mg Oral Daily  . arformoterol  15 mcg Nebulization BID  . budesonide (PULMICORT) nebulizer solution  0.5 mg Nebulization BID  . chlorhexidine  15 mL Mouth Rinse BID  . enoxaparin (LOVENOX) injection  40 mg Subcutaneous Q24H  . folic acid  1 mg Oral Daily  . guaiFENesin  600 mg Oral BID  . ipratropium-albuterol  3 mL Nebulization Q6H WA  . mouth rinse  15 mL Mouth Rinse q12n4p  . methylPREDNISolone (SOLU-MEDROL) injection  60 mg Intravenous Q6H  . nicotine  21 mg Transdermal Daily  . pantoprazole  40 mg Oral Daily  . thiamine  100 mg Oral Daily   Continuous Infusions: . sodium chloride 10 mL/hr at 06/12/18 0416     LOS: 1 day    Time spent: 35  minutes. Greater than 50% of this time was spent in direct contact with the patient, coordinating care and discussing relevant ongoing clinical issues, including discussion about importance of quitting smoking; findings are history of 6 rate demonstrating bronchiectasis and also ongoing issues in his oxygen saturation requiring BiPAP due to ongoing hypoxemic failure due to COPD exacerbation.  Sensation counseling provided for tobacco abuse and alcohol abuse.  Family was updated at bedside and all questions answered.  Patient has reach almost 18 hours now without BiPAP and doing good oxygen saturation on 3 L nasal cannula supplementation.  Still short of breath, having difficulty speaking in full sentences with diffuse wheezing (left more than right).   Barton Dubois, MD [facility] Pager [phone]  If 7PM-7AM, please contact  night-coverage [email or web address] Password [facility] 06/12/2018, 4:42 PM

## 2018-06-12 NOTE — Progress Notes (Signed)
Patient doing well on 3 lpm/[place] have removed BiPAP

## 2018-06-13 DIAGNOSIS — I10 Essential (primary) hypertension: Secondary | ICD-10-CM

## 2018-06-13 DIAGNOSIS — Z720 Tobacco use: Secondary | ICD-10-CM

## 2018-06-13 LAB — CBC
HCT: 36.8 % — ABNORMAL LOW (ref 39.0–52.0)
Hemoglobin: 13 g/dL (ref 13.0–17.0)
MCH: 37.7 pg — ABNORMAL HIGH (ref 26.0–34.0)
MCHC: 35.3 g/dL (ref 30.0–36.0)
MCV: 106.7 fL — ABNORMAL HIGH (ref 80.0–100.0)
Platelets: 207 10*3/uL (ref 150–400)
RBC: 3.45 MIL/uL — ABNORMAL LOW (ref 4.22–5.81)
RDW: 11.9 % (ref 11.5–15.5)
WBC: 15.5 10*3/uL — ABNORMAL HIGH (ref 4.0–10.5)
nRBC: 0 % (ref 0.0–0.2)

## 2018-06-13 LAB — BASIC METABOLIC PANEL
Anion gap: 8 (ref 5–15)
BUN: 36 mg/dL — ABNORMAL HIGH (ref 6–20)
CALCIUM: 8.8 mg/dL — AB (ref 8.9–10.3)
CO2: 24 mmol/L (ref 22–32)
Chloride: 93 mmol/L — ABNORMAL LOW (ref 98–111)
Creatinine, Ser: 1.18 mg/dL (ref 0.61–1.24)
GFR calc Af Amer: >60 mL/min (ref >60)
Glucose, Bld: 132 mg/dL — ABNORMAL HIGH (ref 70–99)
Potassium: 4.3 mmol/L (ref 3.5–5.1)
Sodium: 125 mmol/L — ABNORMAL LOW (ref 135–145)

## 2018-06-13 LAB — OSMOLALITY, URINE: Osmolality, Ur: 459 mOsm/kg (ref 300–900)

## 2018-06-13 NOTE — Progress Notes (Signed)
Patient is currently on room air with sats of 96%. Patient is in no distress and is resting comfortably. BIPAP is not needed at this time.

## 2018-06-13 NOTE — Progress Notes (Signed)
PROGRESS NOTE    Jesus Walker  EPP:[id number] DOB: 01/09/1961 DOA: 06/10/2018 PCP: Rosita Fire, MD     Brief Narrative:  58 y.o. male, with history of alcohol abuse, asthma, COPD, GERD, hypertension, chronic hyponatremia came to ED with complaints of shortness of breath.  Patient says that for past 2 days he has been having worsening shortness of breath.  Also complains of left-sided chest pain.  As symptoms had worsened EMS was called.  He was hypoxemic with O2 sats 70% on room air.  In the ED patient was put on BiPAP.  Assessment & Plan: 1-acute respiratory failure with hypoxia ([place]): COPD exacerbation. -Patient has required the use of BiPAP since he hit the ED altered approximately 16 hours ago. -Overall condition has stabilized and the patient is only using 3 L nasal cannula supplementation with good O2 sat.  We will continue to wean as tolerated and assess his ability of requiring oxygen at rest or on exertion while on room air. -Will continue IV steroids, Brovana, Pulmicort, DuoNeb, Mucinex and flutter valve -Smoking cessation has been provided -Continue supportive care -Follow clinical response. -Due to component of bronchiectasis patient was on doxycycline. -Has remained afebrile and is slowly but steadily continue to have an improvement in his respiratory status.  2-hypertension -Overall stable -Will continue the use of amlodipine.  3-hyponatremia/alcohol abuse -Fluid resuscitation provided -Follow electrolytes trend. -Patient low sodium most likely associated with chronic alcohol abuse -Cessation counseling has been provided -Will continue CIWA protocol -No active signs of withdrawal appreciated currently.   -Continue thiamine and folic acid.  4-GERD/GI prophylaxis -Continue the use of PPI  DVT prophylaxis: Lovenox Code Status: Full code Family Communication: Sister and girlfriend at bedside. Disposition Plan: Remains inpatient. Continue nebulizer management,  CIWA protocol, continue IV steroids, antibiotics, oxygen supplementation.  Assess need for oxygen supplementation at rest and on activity.  Continue to monitor for any signs of withdrawal symptoms.  Consultants:   None  Procedures:   See below for x-ray report.  Antimicrobials:  Anti-infectives (From admission, onward)   Start     Dose/Rate Route Frequency Ordered Stop   06/12/18 1800  doxycycline (VIBRA-TABS) tablet 100 mg     100 mg Oral Every 12 hours 06/12/18 1648         Subjective: Patient has remained afebrile, denies nausea, no vomiting.  Still experiencing intermittent episode of pleuritic discomfort with certain activities and deep breath.  Patient reports shortness of breath with exertion, easily being winded and having mild difficulty speaking in full sentences; despite that his overall improved and using only 3 L nasal cannula supplementation.  Objective: Vitals:   06/13/18 1400 06/13/18 1500 06/13/18 1600 06/13/18 1633  BP: 123/62 118/61 (!) 108/56   Pulse: (!) 113 (!) 105 (!) 114   Resp: (!) 22 17 16    Temp:    98.6 F (37 C)  TempSrc:    Oral  SpO2: 95% 100% 97%   Weight:      Height:        Intake/Output Summary (Last 24 hours) at 06/13/2018 1716 Last data filed at 06/13/2018 0500 Gross per 24 hour  Intake -  Output 625 ml  Net -625 ml   Filed Weights   06/11/18 1302 06/12/18 0429 06/13/18 0500  Weight: 53.3 kg 53.5 kg 53.5 kg    Examination: General exam: Alert, awake, oriented x 3; feeling much better and breathing easier today.  Patient reported pleuritic chest pain and shortness of breath with exertion;  but overall improvement and afebrile.  Using 3 L nasal cannula oxygen supplementation.  Mild difficulty speaking in full sentences due to shortness of breath. Respiratory system: Positive expiratory wheezing, no using accessory muscles, normal respiratory effort.  Wearing 3 L nasal cannula oxygen supplementation. Cardiovascular system: Sinus  tachycardia. No murmurs, rubs, gallops. Gastrointestinal system: Abdomen is nondistended, soft and nontender. No organomegaly or masses felt. Normal bowel sounds heard. Central nervous system: Alert and oriented. No focal neurological deficits. Extremities: No C/C/E, +pedal pulses Skin: No rashes, lesions or ulcers Psychiatry: Judgement and insight appear normal. Mood & affect appropriate.   Data Reviewed: I have personally reviewed following labs and imaging studies  CBC: Recent Labs  Lab 06/10/18 2221 06/11/18 0348 06/13/18 0416  WBC 5.8 4.6 15.5*  NEUTROABS 3.0  --   --   HGB 14.3 13.2 13.0  HCT 40.2 37.7* 36.8*  MCV 103.3* 102.4* 106.7*  PLT 207 210 242   Basic Metabolic Panel: Recent Labs  Lab 06/10/18 2221 06/11/18 0348 06/13/18 0416  NA 119* 121* 125*  K 3.6 4.5 4.3  CL 82* 86* 93*  CO2 25 23 24   GLUCOSE 91 107* 132*  BUN 7 9 36*  CREATININE 0.87 0.82 1.18  CALCIUM 8.9 8.8* 8.8*   GFR: Estimated Creatinine Clearance: 51 mL/min (by C-G formula based on SCr of 1.18 mg/dL).   Liver Function Tests: Recent Labs  Lab 06/10/18 2221 06/11/18 0348  AST 36 37  ALT 21 20  ALKPHOS 89 80  BILITOT 1.0 1.3*  PROT 7.9 7.1  ALBUMIN 4.0 3.5   Cardiac Enzymes: Recent Labs  Lab 06/11/18 0348 06/11/18 0949 06/11/18 1623  TROPONINI <0.03 <0.03 <0.03   Urine analysis:    Component Value Date/Time   COLORURINE AMBER (A) 05/21/2016 1145   APPEARANCEUR CLEAR 05/21/2016 1145   LABSPEC <1.005 (L) 05/21/2016 1145   PHURINE 6.0 05/21/2016 1145   GLUCOSEU NEGATIVE 05/21/2016 1145   HGBUR NEGATIVE 05/21/2016 1145   BILIRUBINUR NEGATIVE 05/21/2016 1145   KETONESUR NEGATIVE 05/21/2016 1145   PROTEINUR NEGATIVE 05/21/2016 1145   NITRITE NEGATIVE 05/21/2016 1145   LEUKOCYTESUR NEGATIVE 05/21/2016 1145    Recent Results (from the past 240 hour(s))  MRSA PCR Screening     Status: None   Collection Time: 06/11/18 12:46 PM  Result Value Ref Range Status   MRSA by PCR  NEGATIVE NEGATIVE Final    Comment:        The GeneXpert MRSA Assay (FDA approved for NASAL specimens only), is one component of a comprehensive MRSA colonization surveillance program. It is not intended to diagnose MRSA infection nor to guide or monitor treatment for MRSA infections. Performed at [facility], [address] [place], [place] [address]      Radiology Studies: No results found.  Scheduled Meds: . arformoterol  15 mcg Nebulization BID  . budesonide (PULMICORT) nebulizer solution  0.5 mg Nebulization BID  . chlorhexidine  15 mL Mouth Rinse BID  . doxycycline  100 mg Oral Q12H  . enoxaparin (LOVENOX) injection  40 mg Subcutaneous Q24H  . folic acid  1 mg Oral Daily  . guaiFENesin  600 mg Oral BID  . ipratropium-albuterol  3 mL Nebulization Q6H WA  . mouth rinse  15 mL Mouth Rinse q12n4p  . methylPREDNISolone (SOLU-MEDROL) injection  60 mg Intravenous Q6H  . nicotine  21 mg Transdermal Daily  . pantoprazole  40 mg Oral Daily  . thiamine  100 mg Oral Daily   Continuous Infusions: .  sodium chloride 10 mL/hr at 06/12/18 0416     LOS: 2 days    Time spent: 30 minutes   Barton Dubois, MD [facility] Pager [phone]  If 7PM-7AM, please contact night-coverage [email or web address] Password [facility] 06/13/2018, 5:16 PM

## 2018-06-13 NOTE — Progress Notes (Signed)
Patient sitting with no oxygen in chair saturations 96%. Walked 150 feet oxygen saturations on room air 91%. 2 minutes after resting on room air increased back to 96%.

## 2018-06-14 DIAGNOSIS — I10 Essential (primary) hypertension: Secondary | ICD-10-CM

## 2018-06-14 DIAGNOSIS — F1010 Alcohol abuse, uncomplicated: Secondary | ICD-10-CM

## 2018-06-14 MED ORDER — GUAIFENESIN ER 600 MG PO TB12: 600.0000 mg | ORAL_TABLET | Freq: Two times a day (BID) | ORAL | 0 refills | Status: DC

## 2018-06-14 MED ORDER — BUDESONIDE-FORMOTEROL FUMARATE 160-4.5 MCG/ACT IN AERO: 2.0000 | INHALATION_SPRAY | Freq: Two times a day (BID) | RESPIRATORY_TRACT | 3 refills | Status: AC

## 2018-06-14 MED ORDER — PREDNISONE 20 MG PO TABS: ORAL_TABLET | ORAL | 0 refills | Status: DC

## 2018-06-14 MED ORDER — DOXYCYCLINE HYCLATE 100 MG PO TABS: 100.0000 mg | ORAL_TABLET | Freq: Two times a day (BID) | ORAL | 0 refills | Status: AC

## 2018-06-14 MED ORDER — NICOTINE 21 MG/24HR TD PT24: 21.0000 mg | MEDICATED_PATCH | Freq: Every day | TRANSDERMAL | 0 refills | Status: DC

## 2018-06-14 NOTE — Discharge Summary (Signed)
Physician Discharge Summary  Jesus Walker YOV:[id number] DOB: 1961-04-16 DOA: 06/10/2018  PCP: Rosita Fire, MD  Admit date: 06/10/2018 Discharge date: 06/14/2018  Time spent: 35 minutes  Recommendations for Outpatient Follow-up:  1. Repeat basic metabolic panel to follow electrolytes and renal function 2. Continue assisting patient with a smoking cessation and quitting alcohol abuse. 3. Arrange outpatient follow-up with pulmonary service for PFTs and further management of his COPD. 4. Reassess blood pressure and further adjust antihypertensive regimen as needed.   Discharge Diagnoses:  Active Problems:   Precordial chest pain   Acute respiratory failure with hypoxia (HCC)   Tobacco abuse   Alcohol abuse   Benign essential HTN GERD  Discharge Condition: Stable and improved.  Patient not requiring oxygen supplementation at discharge.  Instructed to follow-up with PCP in 10 days.  Diet recommendation: Heart healthy diet.  Filed Weights   06/12/18 0429 06/13/18 0500 06/14/18 0500  Weight: 53.5 kg 53.5 kg 53.1 kg    History of present illness:  As per H&P written by Dr. Quentin Ore on 06/11/2018 58 y.o. male, with history of alcohol abuse, asthma, COPD, GERD, hypertension, chronic hyponatremia came to ED with complaints of shortness of breath.  Patient says that for past 2 days he has been having worsening shortness of breath.  Also complains of left-sided chest pain.  As symptoms had worsened EMS was called.  He was hypoxemic with O2 sats 70% on room air.  In the ED patient was put on BiPAP. He denies nausea vomiting or diarrhea. Denies abdominal pain. He drinks beer every day.  Hospital Course:  1-acute respiratory failure with hypoxia [facility]): COPD exacerbation. -Patient required the use of BiPAP since he hit the ED for approx approximately 16 hours ago. -Overall condition has stabilized and the patient did not require oxygen supplementation at time of discharge. -Patient  discharged on a steroids tapering, the use of Mucinex, flutter valve, rescue inhaler and initiation of Symbicort.   -Smoking cessation has been provided -Due to component of bronchiectasis patient was discharged on doxycycline. -Has remained afebrile and and demonstrating significant improvement of his respiratory status to be discharged home and complete outpatient therapy.  2-hypertension -Overall stable -Will continue the use of amlodipine. -Heart healthy diet recommended -Patient also advised to quit smoking and to stop alcohol abuse.  3-hyponatremia/alcohol abuse -Fluid resuscitation provided at discharge sodium level improved and essentially back to his baseline. -Repeat basic metabolic panel at follow-up visit to reassess electrolytes trend. -Patient low sodium most likely associated with chronic alcohol abuse -Alcohol cessation counseling has been provided -No active signs of withdrawal appreciated during hospitalization.  Patient was kept under CIWA protocol  -Continue thiamine and folic acid.  4-GERD/GI prophylaxis -Continue the use of PPI   Procedures:  See below for x-ray reports.  Consultations:  None  Discharge Exam: Vitals:   06/14/18 1100 06/14/18 1125  BP:    Pulse: (!) 102   Resp: 19   Temp:  98.4 F (36.9 C)  SpO2: 97%    General exam: Alert, awake, oriented x 3; feeling much better and breathing easier.  Patient no requiring oxygen supplementation and speaking full sentences. Respiratory system:  Very little expiratory wheezing at time of discharge, good air movement bilaterally, positive scattered rhonchi, no crackles, normal respiratory effort and good O2 sat on room air. Cardiovascular system: Sinus tachycardia. No murmurs, rubs, gallops.  No JVD appreciated on exam Gastrointestinal system: Abdomen is nondistended, soft and nontender. No organomegaly or masses felt. Normal  bowel sounds heard. Central nervous system: Alert and oriented. No focal  neurological deficits. Extremities: No C/C/E, +pedal pulses Skin: No rashes, lesions or ulcers Psychiatry: Judgement and insight appear normal. Mood & affect appropriate.   Discharge Instructions   Discharge Instructions    Diet - low sodium heart healthy   Complete by:  As directed    Discharge instructions   Complete by:  As directed    Take medications as prescribed Stop smoking Arrange follow-up with PCP in 10 days Keep yourself well-hydrated.     Allergies as of 06/14/2018   Not on File     Medication List    STOP taking these medications   chlordiazePOXIDE 25 MG capsule Commonly known as:  LIBRIUM     TAKE these medications   amLODipine 5 MG tablet Commonly known as:  NORVASC Take 5 mg by mouth daily.   budesonide-formoterol 160-4.5 MCG/ACT inhaler Commonly known as:  SYMBICORT Inhale 2 puffs into the lungs 2 (two) times daily.   doxycycline 100 MG tablet Commonly known as:  VIBRA-TABS Take 1 tablet (100 mg total) by mouth every 12 (twelve) hours for 4 days.   folic acid 1 MG tablet Commonly known as:  FOLVITE Take 1 tablet (1 mg total) by mouth daily.   guaiFENesin 600 MG 12 hr tablet Commonly known as:  MUCINEX Take 1 tablet (600 mg total) by mouth 2 (two) times daily.   Ipratropium-Albuterol 20-100 MCG/ACT Aers respimat Commonly known as:  COMBIVENT RESPIMAT Inhale 1 puff into the lungs every 6 (six) hours as needed for wheezing or shortness of breath.   nicotine 21 mg/24hr patch Commonly known as:  NICODERM CQ - dosed in mg/24 hours Place 1 patch (21 mg total) onto the skin daily. Start taking on:  June 15, 2018   omeprazole 20 MG capsule Commonly known as:  PRILOSEC Take 20 mg by mouth daily.   predniSONE 20 MG tablet Commonly known as:  DELTASONE Take 3 tablets by mouth daily x2 days; then 2 tablets by mouth daily x2-days; then 1 tablet by mouth daily x3 days; then half tablet by mouth daily x3 days and stop prednisone.   thiamine 100  MG tablet Commonly known as:  VITAMIN B-1 Take 1 tablet (100 mg total) by mouth daily.      Not on File Follow-up Information    Rosita Fire, MD. Schedule an appointment as soon as possible for a visit in 10 day(s).   Specialty:  Internal Medicine Contact information: [place] [address] [phone]            The results of significant diagnostics from this hospitalization (including imaging, microbiology, ancillary and laboratory) are listed below for reference.    Significant Diagnostic Studies: Dg Chest Portable 1 View  Result Date: 06/10/2018 CLINICAL DATA:  Shortness of breath and chest discomfort since yesterday. History of hypertension, asthma, current smoker. EXAM: PORTABLE CHEST 1 VIEW COMPARISON:  05/26/2018 FINDINGS: Normal heart size and pulmonary vascularity. Elevation of right hemidiaphragm is unchanged since prior study. Lungs appear clear and expanded. No airspace disease or consolidation. No blunting of costophrenic angles. No pneumothorax. Mediastinal contours appear intact. IMPRESSION: No active disease. Electronically Signed   By: Lucienne Capers M.D.   On: 06/10/2018 22:35   Dg Chest Portable 1 View  Result Date: 05/26/2018 CLINICAL DATA:  Shortness of breath for 1 week. COPD. Asthma. EXAM: PORTABLE CHEST 1 VIEW COMPARISON:  05/21/2016 FINDINGS: The heart size and mediastinal contours  are within normal limits. Elevation of right hemidiaphragm and right basilar scarring remains stable. No evidence of pulmonary infiltrate or edema. No evidence of pleural effusion. IMPRESSION: Stable elevation of right hemidiaphragm and right basilar scarring. No active disease. Electronically Signed   By: Earle Gell M.D.   On: 05/26/2018 17:40    Microbiology: Recent Results (from the past 240 hour(s))  MRSA PCR Screening     Status: None   Collection Time: 06/11/18 12:46 PM  Result Value Ref Range Status   MRSA by PCR NEGATIVE NEGATIVE Final     Comment:        The GeneXpert MRSA Assay (FDA approved for NASAL specimens only), is one component of a comprehensive MRSA colonization surveillance program. It is not intended to diagnose MRSA infection nor to guide or monitor treatment for MRSA infections. Performed at [facility], [address] [place], [place] [address]      Labs: Basic Metabolic Panel: Recent Labs  Lab 06/10/18 2221 06/11/18 0348 06/13/18 0416  NA 119* 121* 125*  K 3.6 4.5 4.3  CL 82* 86* 93*  CO2 25 23 24   GLUCOSE 91 107* 132*  BUN 7 9 36*  CREATININE 0.87 0.82 1.18  CALCIUM 8.9 8.8* 8.8*   Liver Function Tests: Recent Labs  Lab 06/10/18 2221 06/11/18 0348  AST 36 37  ALT 21 20  ALKPHOS 89 80  BILITOT 1.0 1.3*  PROT 7.9 7.1  ALBUMIN 4.0 3.5   CBC: Recent Labs  Lab 06/10/18 2221 06/11/18 0348 06/13/18 0416  WBC 5.8 4.6 15.5*  NEUTROABS 3.0  --   --   HGB 14.3 13.2 13.0  HCT 40.2 37.7* 36.8*  MCV 103.3* 102.4* 106.7*  PLT 207 210 207   Cardiac Enzymes: Recent Labs  Lab 06/11/18 0348 06/11/18 0949 06/11/18 1623  TROPONINI <0.03 <0.03 <0.03   BNP: BNP (last 3 results) Recent Labs    05/26/18 1817 06/10/18 2221  BNP 32.0 24.0    Signed:  Barton Dubois MD.  [facility] 06/14/2018, 11:49 AM

## 2018-06-14 NOTE — Progress Notes (Signed)
Being discharged to home. IV access removed. Patient dressing himself and will be taken down to car by wheel chair.

## 2018-06-14 NOTE — Progress Notes (Signed)
Patient to DC today. Has insurance and PCP.  No CM needs.

## 2018-06-17 LAB — POCT I-STAT TROPONIN I: Troponin i, poc: 0.01 ng/mL (ref 0.00–0.08)

## 2018-06-21 ENCOUNTER — Other Ambulatory Visit (HOSPITAL_COMMUNITY): Payer: Self-pay | Admitting: Respiratory Therapy

## 2018-06-21 DIAGNOSIS — J441 Chronic obstructive pulmonary disease with (acute) exacerbation: Secondary | ICD-10-CM

## 2018-07-07 ENCOUNTER — Ambulatory Visit (HOSPITAL_COMMUNITY)
Admission: RE | Admit: 2018-07-07 | Discharge: 2018-07-07 | Disposition: A | Discharge status: Home / Self Care | Payer: Medicaid Other | Source: Ambulatory Visit | Attending: Internal Medicine | Admitting: Internal Medicine

## 2018-07-07 DIAGNOSIS — J441 Chronic obstructive pulmonary disease with (acute) exacerbation: Secondary | ICD-10-CM | POA: Diagnosis present

## 2018-07-07 LAB — SPIROMETRY WITH GRAPH
FEF 25-75 Pre: 0.89 L/sec
FEF2575-%Pred-Pre: 39 %
FEV1-%Pred-Pre: 60 %
FEV1-Pre: 1.36 L
FEV1FVC-%Pred-Pre: 70 %
FEV6-%Pred-Pre: 86 %
FEV6-Pre: 2.41 L
FEV6FVC-%PRED-PRE: 103 %
FVC-%Pred-Pre: 84 %
FVC-PRE: 2.44 L
PRE FEV6/FVC RATIO: 99 %
Pre FEV1/FVC ratio: 56 %

## 2020-04-24 ENCOUNTER — Inpatient Hospital Stay (HOSPITAL_COMMUNITY)
Admission: EM | Admit: 2020-04-24 | Discharge: 2020-04-27 | DRG: 641 | Disposition: A | Discharge status: Home / Self Care | Payer: Medicaid Other | Attending: Family Medicine | Admitting: Family Medicine

## 2020-04-24 ENCOUNTER — Other Ambulatory Visit: Payer: Self-pay

## 2020-04-24 ENCOUNTER — Encounter (HOSPITAL_COMMUNITY): Payer: Self-pay | Admitting: Emergency Medicine

## 2020-04-24 ENCOUNTER — Emergency Department (HOSPITAL_COMMUNITY): Payer: Medicaid Other

## 2020-04-24 DIAGNOSIS — J441 Chronic obstructive pulmonary disease with (acute) exacerbation: Secondary | ICD-10-CM | POA: Diagnosis present

## 2020-04-24 DIAGNOSIS — F17200 Nicotine dependence, unspecified, uncomplicated: Secondary | ICD-10-CM | POA: Diagnosis present

## 2020-04-24 DIAGNOSIS — J440 Chronic obstructive pulmonary disease with acute lower respiratory infection: Secondary | ICD-10-CM | POA: Diagnosis present

## 2020-04-24 DIAGNOSIS — K219 Gastro-esophageal reflux disease without esophagitis: Secondary | ICD-10-CM | POA: Diagnosis present

## 2020-04-24 DIAGNOSIS — F17210 Nicotine dependence, cigarettes, uncomplicated: Secondary | ICD-10-CM | POA: Diagnosis present

## 2020-04-24 DIAGNOSIS — Y92239 Unspecified place in hospital as the place of occurrence of the external cause: Secondary | ICD-10-CM | POA: Diagnosis not present

## 2020-04-24 DIAGNOSIS — F1020 Alcohol dependence, uncomplicated: Secondary | ICD-10-CM | POA: Diagnosis present

## 2020-04-24 DIAGNOSIS — Z20822 Contact with and (suspected) exposure to covid-19: Secondary | ICD-10-CM | POA: Diagnosis present

## 2020-04-24 DIAGNOSIS — Z7951 Long term (current) use of inhaled steroids: Secondary | ICD-10-CM

## 2020-04-24 DIAGNOSIS — Z79899 Other long term (current) drug therapy: Secondary | ICD-10-CM

## 2020-04-24 DIAGNOSIS — I10 Essential (primary) hypertension: Secondary | ICD-10-CM | POA: Diagnosis present

## 2020-04-24 DIAGNOSIS — D72829 Elevated white blood cell count, unspecified: Secondary | ICD-10-CM | POA: Diagnosis not present

## 2020-04-24 DIAGNOSIS — J209 Acute bronchitis, unspecified: Secondary | ICD-10-CM | POA: Diagnosis present

## 2020-04-24 DIAGNOSIS — K2970 Gastritis, unspecified, without bleeding: Secondary | ICD-10-CM | POA: Diagnosis present

## 2020-04-24 DIAGNOSIS — T380X5A Adverse effect of glucocorticoids and synthetic analogues, initial encounter: Secondary | ICD-10-CM | POA: Diagnosis not present

## 2020-04-24 DIAGNOSIS — E871 Hypo-osmolality and hyponatremia: Principal | ICD-10-CM | POA: Diagnosis present

## 2020-04-24 DIAGNOSIS — Z825 Family history of asthma and other chronic lower respiratory diseases: Secondary | ICD-10-CM

## 2020-04-24 DIAGNOSIS — K5900 Constipation, unspecified: Secondary | ICD-10-CM | POA: Diagnosis present

## 2020-04-24 LAB — CBC WITH DIFFERENTIAL/PLATELET
Abs Immature Granulocytes: 0.02 10*3/uL (ref 0.00–0.07)
Basophils Absolute: 0.1 10*3/uL (ref 0.0–0.1)
Basophils Relative: 1 %
Eosinophils Absolute: 0.6 10*3/uL — ABNORMAL HIGH (ref 0.0–0.5)
Eosinophils Relative: 10 %
HCT: 39.7 % (ref 39.0–52.0)
Hemoglobin: 14.2 g/dL (ref 13.0–17.0)
Immature Granulocytes: 0 %
Lymphocytes Relative: 22 %
Lymphs Abs: 1.2 10*3/uL (ref 0.7–4.0)
MCH: 35.6 pg — ABNORMAL HIGH (ref 26.0–34.0)
MCHC: 35.8 g/dL (ref 30.0–36.0)
MCV: 99.5 fL (ref 80.0–100.0)
Monocytes Absolute: 0.4 10*3/uL (ref 0.1–1.0)
Monocytes Relative: 7 %
Neutro Abs: 3.4 10*3/uL (ref 1.7–7.7)
Neutrophils Relative %: 60 %
Platelets: 179 10*3/uL (ref 150–400)
RBC: 3.99 MIL/uL — ABNORMAL LOW (ref 4.22–5.81)
RDW: 11 % — ABNORMAL LOW (ref 11.5–15.5)
WBC: 5.7 10*3/uL (ref 4.0–10.5)
nRBC: 0 % (ref 0.0–0.2)

## 2020-04-24 NOTE — ED Triage Notes (Signed)
Pt arrives via EMS for shortness of breath. Pt given 125mg  solumedrol and albuterol treatment by EMS.

## 2020-04-25 DIAGNOSIS — E871 Hypo-osmolality and hyponatremia: Secondary | ICD-10-CM | POA: Diagnosis not present

## 2020-04-25 LAB — BASIC METABOLIC PANEL
Anion gap: 9 (ref 5–15)
Anion gap: 9 (ref 5–15)
BUN: 6 mg/dL — ABNORMAL LOW (ref 8–23)
BUN: 7 mg/dL — ABNORMAL LOW (ref 8–23)
CO2: 25 mmol/L (ref 22–32)
CO2: 24 mmol/L (ref 22–32)
Calcium: 9.1 mg/dL (ref 8.9–10.3)
Calcium: 9.2 mg/dL (ref 8.9–10.3)
Chloride: 85 mmol/L — ABNORMAL LOW (ref 98–111)
Chloride: 92 mmol/L — ABNORMAL LOW (ref 98–111)
Creatinine, Ser: 0.79 mg/dL (ref 0.61–1.24)
Creatinine, Ser: 0.77 mg/dL (ref 0.61–1.24)
GFR, Estimated: >60 mL/min (ref >60)
GFR, Estimated: >60 mL/min (ref >60)
Glucose, Bld: 101 mg/dL — ABNORMAL HIGH (ref 70–99)
Glucose, Bld: 142 mg/dL — ABNORMAL HIGH (ref 70–99)
Potassium: 4.3 mmol/L (ref 3.5–5.1)
Potassium: 4.5 mmol/L (ref 3.5–5.1)
Sodium: 119 mmol/L — CL (ref 135–145)
Sodium: 125 mmol/L — ABNORMAL LOW (ref 135–145)

## 2020-04-25 LAB — GLUCOSE, CAPILLARY
Glucose-Capillary: 146 mg/dL — ABNORMAL HIGH (ref 70–99)
Glucose-Capillary: 234 mg/dL — ABNORMAL HIGH (ref 70–99)
Glucose-Capillary: 139 mg/dL — ABNORMAL HIGH (ref 70–99)

## 2020-04-25 LAB — HEPATIC FUNCTION PANEL
ALT: 22 U/L (ref 0–44)
AST: 45 U/L — ABNORMAL HIGH (ref 15–41)
Albumin: 3.9 g/dL (ref 3.5–5.0)
Alkaline Phosphatase: 102 U/L (ref 38–126)
Bilirubin, Direct: 0.3 mg/dL — ABNORMAL HIGH (ref 0.0–0.2)
Indirect Bilirubin: 0.7 mg/dL (ref 0.3–0.9)
Total Bilirubin: 1 mg/dL (ref 0.3–1.2)
Total Protein: 7.7 g/dL (ref 6.5–8.1)

## 2020-04-25 LAB — PHOSPHORUS: Phosphorus: 3.7 mg/dL (ref 2.5–4.6)

## 2020-04-25 LAB — CBC
HCT: 44.3 % (ref 39.0–52.0)
Hemoglobin: 15.8 g/dL (ref 13.0–17.0)
MCH: 35.8 pg — ABNORMAL HIGH (ref 26.0–34.0)
MCHC: 35.7 g/dL (ref 30.0–36.0)
MCV: 100.5 fL — ABNORMAL HIGH (ref 80.0–100.0)
Platelets: 224 10*3/uL (ref 150–400)
RBC: 4.41 MIL/uL (ref 4.22–5.81)
RDW: 11.2 % — ABNORMAL LOW (ref 11.5–15.5)
WBC: 3.2 10*3/uL — ABNORMAL LOW (ref 4.0–10.5)
nRBC: 0 % (ref 0.0–0.2)

## 2020-04-25 LAB — ETHANOL: Alcohol, Ethyl (B): <10 mg/dL (ref <10)

## 2020-04-25 LAB — RESP PANEL BY RT-PCR (FLU A&B, COVID) ARPGX2
Influenza A by PCR: NEGATIVE
Influenza B by PCR: NEGATIVE
SARS Coronavirus 2 by RT PCR: NEGATIVE

## 2020-04-25 LAB — MAGNESIUM: Magnesium: 1.5 mg/dL — ABNORMAL LOW (ref 1.7–2.4)

## 2020-04-25 MED ORDER — LORAZEPAM 2 MG/ML IJ SOLN: 0.0000 mg | Freq: Two times a day (BID) | INTRAMUSCULAR | Status: DC

## 2020-04-25 MED ORDER — LORAZEPAM 2 MG/ML IJ SOLN: 1.0000 mg | INTRAMUSCULAR | Status: DC | PRN

## 2020-04-25 MED ORDER — ACETAMINOPHEN 650 MG RE SUPP: 650.0000 mg | Freq: Four times a day (QID) | RECTAL | Status: DC | PRN

## 2020-04-25 MED ORDER — LORAZEPAM 2 MG/ML IJ SOLN
0.0000 mg | Freq: Four times a day (QID) | INTRAMUSCULAR | Status: AC
  Filled 2020-04-25: qty 1

## 2020-04-25 MED ORDER — IPRATROPIUM BROMIDE 0.02 % IN SOLN
0.5000 mg | Freq: Once | RESPIRATORY_TRACT | Status: AC
  Administered 2020-04-25: 0.5 mg via RESPIRATORY_TRACT
  Filled 2020-04-25: qty 2.5

## 2020-04-25 MED ORDER — ALBUTEROL SULFATE (2.5 MG/3ML) 0.083% IN NEBU: 2.5000 mg | INHALATION_SOLUTION | RESPIRATORY_TRACT | Status: DC | PRN

## 2020-04-25 MED ORDER — MAGNESIUM SULFATE 2 GM/50ML IV SOLN
2.0000 g | Freq: Once | INTRAVENOUS | Status: AC
  Administered 2020-04-25: 2 g via INTRAVENOUS
  Filled 2020-04-25: qty 50

## 2020-04-25 MED ORDER — FOLIC ACID 1 MG PO TABS
1.0000 mg | ORAL_TABLET | Freq: Every day | ORAL | Status: DC
  Administered 2020-04-25 – 2020-04-27 (×3): 1 mg via ORAL
  Filled 2020-04-25 (×3): qty 1

## 2020-04-25 MED ORDER — LORAZEPAM 1 MG PO TABS: 1.0000 mg | ORAL_TABLET | ORAL | Status: DC | PRN

## 2020-04-25 MED ORDER — IPRATROPIUM-ALBUTEROL 0.5-2.5 (3) MG/3ML IN SOLN
3.0000 mL | Freq: Four times a day (QID) | RESPIRATORY_TRACT | Status: DC
  Administered 2020-04-25: 3 mL via RESPIRATORY_TRACT
  Filled 2020-04-25: qty 3

## 2020-04-25 MED ORDER — IPRATROPIUM-ALBUTEROL 0.5-2.5 (3) MG/3ML IN SOLN
3.0000 mL | Freq: Three times a day (TID) | RESPIRATORY_TRACT | Status: DC
  Administered 2020-04-25 – 2020-04-26 (×5): 3 mL via RESPIRATORY_TRACT
  Filled 2020-04-25 (×5): qty 3

## 2020-04-25 MED ORDER — METHYLPREDNISOLONE SODIUM SUCC 40 MG IJ SOLR
40.0000 mg | Freq: Four times a day (QID) | INTRAMUSCULAR | Status: AC
  Administered 2020-04-25 (×3): 40 mg via INTRAVENOUS
  Filled 2020-04-25 (×3): qty 1

## 2020-04-25 MED ORDER — PREDNISONE 20 MG PO TABS
40.0000 mg | ORAL_TABLET | Freq: Every day | ORAL | Status: DC
  Administered 2020-04-26: 40 mg via ORAL
  Filled 2020-04-25: qty 2

## 2020-04-25 MED ORDER — NICOTINE 21 MG/24HR TD PT24
21.0000 mg | MEDICATED_PATCH | Freq: Every day | TRANSDERMAL | Status: DC
  Administered 2020-04-25 – 2020-04-27 (×3): 21 mg via TRANSDERMAL
  Filled 2020-04-25 (×3): qty 1

## 2020-04-25 MED ORDER — THIAMINE HCL 100 MG PO TABS
100.0000 mg | ORAL_TABLET | Freq: Every day | ORAL | Status: DC
  Administered 2020-04-25 – 2020-04-27 (×3): 100 mg via ORAL
  Filled 2020-04-25 (×3): qty 1

## 2020-04-25 MED ORDER — AMLODIPINE BESYLATE 5 MG PO TABS
5.0000 mg | ORAL_TABLET | Freq: Every day | ORAL | Status: DC
  Administered 2020-04-25 – 2020-04-27 (×3): 5 mg via ORAL
  Filled 2020-04-25 (×3): qty 1

## 2020-04-25 MED ORDER — ONDANSETRON HCL 4 MG/2ML IJ SOLN: 4.0000 mg | Freq: Four times a day (QID) | INTRAMUSCULAR | Status: DC | PRN

## 2020-04-25 MED ORDER — THIAMINE HCL 100 MG/ML IJ SOLN
100.0000 mg | Freq: Every day | INTRAMUSCULAR | Status: DC
  Filled 2020-04-25: qty 2

## 2020-04-25 MED ORDER — ACETAMINOPHEN 325 MG PO TABS
650.0000 mg | ORAL_TABLET | Freq: Four times a day (QID) | ORAL | Status: DC | PRN
  Administered 2020-04-26: 650 mg via ORAL
  Filled 2020-04-25: qty 2

## 2020-04-25 MED ORDER — ADULT MULTIVITAMIN W/MINERALS CH
1.0000 | ORAL_TABLET | Freq: Every day | ORAL | Status: DC
  Administered 2020-04-25 – 2020-04-27 (×3): 1 via ORAL
  Filled 2020-04-25 (×3): qty 1

## 2020-04-25 MED ORDER — ONDANSETRON HCL 4 MG PO TABS: 4.0000 mg | ORAL_TABLET | Freq: Four times a day (QID) | ORAL | Status: DC | PRN

## 2020-04-25 MED ORDER — ALBUTEROL SULFATE (2.5 MG/3ML) 0.083% IN NEBU
5.0000 mg | INHALATION_SOLUTION | Freq: Once | RESPIRATORY_TRACT | Status: AC
  Administered 2020-04-25: 5 mg via RESPIRATORY_TRACT
  Filled 2020-04-25: qty 6

## 2020-04-25 MED ORDER — SODIUM CHLORIDE 0.9 % IV SOLN: INTRAVENOUS | Status: DC

## 2020-04-25 MED ORDER — METOPROLOL TARTRATE 5 MG/5ML IV SOLN
5.0000 mg | Freq: Once | INTRAVENOUS | Status: AC
  Administered 2020-04-25: 5 mg via INTRAVENOUS
  Filled 2020-04-25: qty 5

## 2020-04-25 MED ORDER — ENOXAPARIN SODIUM 40 MG/0.4ML ~~LOC~~ SOLN
40.0000 mg | SUBCUTANEOUS | Status: DC
  Administered 2020-04-25 – 2020-04-27 (×3): 40 mg via SUBCUTANEOUS
  Filled 2020-04-25 (×3): qty 0.4

## 2020-04-25 MED ORDER — SODIUM CHLORIDE 0.9 % IV BOLUS (SEPSIS)
1000.0000 mL | Freq: Once | INTRAVENOUS | Status: AC
  Administered 2020-04-25: 1000 mL via INTRAVENOUS

## 2020-04-25 MED ORDER — PANTOPRAZOLE SODIUM 40 MG PO TBEC
40.0000 mg | DELAYED_RELEASE_TABLET | Freq: Every day | ORAL | Status: DC
  Administered 2020-04-25 – 2020-04-27 (×3): 40 mg via ORAL
  Filled 2020-04-25 (×3): qty 1

## 2020-04-25 NOTE — Plan of Care (Signed)

## 2020-04-25 NOTE — Evaluation (Signed)
Occupational Therapy Evaluation Patient Details Name: Jesus Walker MRN: [id number] DOB: Sep 20, 1960 Today's Date: 04/25/2020    History of Present Illness Jesus Walker is a 59 y.o. male with medical history significant of alcohol dependence (drinks significant amount of beer daily), tobacco abuse, COPD, GERD, hypertension who is coming to the emergency department due to progressively worse dyspnea associated with wheezing, cough, which is occasionally productive of whitish-looking sputum and pleuritic CP for several days, but particularly worsened yesterday (Tuesday, 04/24/2020).  He denies fever, chills or night sweats.  He denies rhinorrhea, sore throat or hemoptysis.  He denies palpitations, diaphoresis, PND, orthopnea or pitting edema of the lower extremities.  No abdominal pain, nausea, emesis, diarrhea, constipation, melena or hematochezia.  He has had some left flank pain in the past few days along with some mild dysuria, but denies frequency or hematuria.    Clinical Impression   Pt agreeable to OT evaluation. Pt performing ADLs and mobility tasks independently, no SOB noted, pt on supplemental O2 throughout session. Pt is at baseline with ADL completion, slightly increased time needed for SOB. No further OT services required at this time.     Follow Up Recommendations  No OT follow up    Equipment Recommendations  None recommended by OT       Precautions / Restrictions Precautions Precautions: None Restrictions Weight Bearing Restrictions: No      Mobility Bed Mobility Overal bed mobility: Independent                  Transfers Overall transfer level: Modified independent Equipment used: None                      ADL either performed or assessed with clinical judgement   ADL Overall ADL's : Modified independent;At baseline                                             Vision Baseline Vision/History: Wears glasses Wears Glasses:  Reading only Patient Visual Report: No change from baseline Vision Assessment?: No apparent visual deficits            Pertinent Vitals/Pain Pain Assessment: No/denies pain     Hand Dominance Right   Extremity/Trunk Assessment Upper Extremity Assessment Upper Extremity Assessment: Overall WFL for tasks assessed (BUE ROM limited to 50%)   Lower Extremity Assessment Lower Extremity Assessment: Defer to PT evaluation   Cervical / Trunk Assessment Cervical / Trunk Assessment: Normal   Communication Communication Communication: No difficulties   Cognition Arousal/Alertness: Awake/alert Behavior During Therapy: WFL for tasks assessed/performed Overall Cognitive Status: Within Functional Limits for tasks assessed                                                Home Living Family/patient expects to be discharged to:: Private residence Living Arrangements: Non-relatives/Friends (girlfriend) Available Help at Discharge: Family;Available PRN/intermittently Type of Home: Mobile home Home Access: Ramped entrance     Home Layout: One level     Bathroom Shower/Tub: Teacher, early years/pre: Standard     Home Equipment: Cane - single point;Shower seat          Prior Functioning/Environment Level of Independence: Needs assistance  Gait /  Transfers Assistance Needed: Uses SPC occasionally  ADL's / Homemaking Assistance Needed: Pt is independent mostly, girlfriend assists as needed for getting in/out of the tub            OT Problem List: Cardiopulmonary status limiting activity       AM-PAC OT "6 Clicks" Daily Activity     Outcome Measure Help from another person eating meals?: None Help from another person taking care of personal grooming?: None Help from another person toileting, which includes using toliet, bedpan, or urinal?: None Help from another person bathing (including washing, rinsing, drying)?: None Help from another person to  put on and taking off regular upper body clothing?: None Help from another person to put on and taking off regular lower body clothing?: None 6 Click Score: 24   End of Session Equipment Utilized During Treatment: Oxygen  Activity Tolerance: Patient tolerated treatment well Patient left: in bed;with call bell/phone within reach (RT in room)  OT Visit Diagnosis: Muscle weakness (generalized) (M62.81)                Time: 2574-9355 OT Time Calculation (min): 11 min Charges:  OT General Charges $OT Visit: 1 Visit OT Evaluation $OT Eval Low Complexity: 1 Low   Guadelupe Sabin, OTR/L  [phone] 04/25/2020, 8:46 AM

## 2020-04-25 NOTE — H&P (Signed)
History and Physical    Jesus Walker QQV:[id number] DOB: 04-30-1961 DOA: 04/24/2020  PCP: Rosita Fire, MD  Patient coming from: Home.  I have personally briefly reviewed patient's old medical records in [place]  Chief Complaint: Shortness of breath.  HPI: Jesus Walker is a 59 y.o. male with medical history significant of alcohol dependence (drinks significant amount of beer daily), tobacco abuse, COPD, GERD, hypertension who is coming to the emergency department due to progressively worse dyspnea associated with wheezing, cough, which is occasionally productive of whitish-looking sputum and pleuritic CP for several days, but particularly worsened yesterday (Tuesday, 04/24/2020).  He denies fever, chills or night sweats.  He denies rhinorrhea, sore throat or hemoptysis.  He denies palpitations, diaphoresis, PND, orthopnea or pitting edema of the lower extremities.  No abdominal pain, nausea, emesis, diarrhea, constipation, melena or hematochezia.  He has had some left flank pain in the past few days along with some mild dysuria, but denies frequency or hematuria.  No polyuria, polydipsia, polyphagia or blurred vision.  ED Course: Initial vital signs were temperature 97.9 F, pulse 97, respirations 26, BP 171/72 mmHg and O2 sat 92% on room air and 98% on oxygen at 2 LPM via [facility].  The patient was given Solu-Medrol 125 mg IVP and bronchodilators by EMS.  He received a 1000 mL NS bolus and an albuterol 5 mg plus ipratropium 0.5 mg continuous negative.  Labs: CBC shows a white count of 5.7, hemoglobin 14.2 g/dL and platelets 179.  Coronavirus 2 and influenza PCR was negative.  Sodium 119, potassium 4.3, chloride 85 and CO2 25 mmol/L.  Glucose 101, BUN is decreased at 6 and creatinine was 0.79 mg/dL.  Calcium 9.1, magnesium 1.5 and phosphorus 3.7 mg/dL.  LFTs show mild increase AST of 45 units/L and decreased dilated bilirubin 0.3 mg/dL.  Rest of the hepatic functions are  unremarkable.  Imaging: A portable 1 view chest radiograph show normal heart size and mediastinal contours.  Both lungs look clear.  There is chronic elevation of the right hemidiaphragm, but no active disease.  Please see image and full detailed report for further detail.  Review of Systems: As per HPI otherwise all other systems reviewed and are negative.  Past Medical History:  Diagnosis Date  . Alcohol dependence ([place])   . Asthma   . COPD (chronic obstructive pulmonary disease) ([place])   . GERD (gastroesophageal reflux disease)   . Hypertension    Past Surgical History:  Procedure Laterality Date  . BRONCHIAL BRUSHINGS  01/03/2016   Procedure: BRONCHIAL BRUSHINGS;  Surgeon: Sinda Du, MD;  [facility];  Service: Cardiopulmonary;;  . BRONCHIAL WASHINGS  01/03/2016   Procedure: BRONCHIAL WASHINGS;  Surgeon: Sinda Du, MD;  [facility];  Service: Cardiopulmonary;;  . FLEXIBLE BRONCHOSCOPY N/A 01/03/2016   Procedure: FLEXIBLE BRONCHOSCOPY;  Surgeon: Sinda Du, MD;  [facility];  Service: Cardiopulmonary;  Laterality: N/A;   Social History  reports that he has been smoking cigarettes. He has been smoking about 1.00 pack per day. He has never used smokeless tobacco. He reports current alcohol use of about 12.0 standard drinks of alcohol per week. He reports that he does not use drugs.  No Known Allergies  Family History  Problem Relation Age of Onset  . Asthma Mother 22  . Asthma Father 93   Prior to Admission medications   Medication Sig Start Date End Date Taking? Authorizing Provider  amLODipine (NORVASC) 5 MG tablet Take  5 mg by mouth daily.    [provider]  budesonide-formoterol (SYMBICORT) 160-4.5 MCG/ACT inhaler Inhale 2 puffs into the lungs 2 (two) times daily. 06/14/18   Barton Dubois, MD  folic acid (FOLVITE) 1 MG tablet Take 1 tablet (1 mg total) by mouth daily. 05/28/16   Rosita Fire, MD  Ipratropium-Albuterol  (COMBIVENT RESPIMAT) 20-100 MCG/ACT AERS respimat Inhale 1 puff into the lungs every 6 (six) hours as needed for wheezing or shortness of breath. 05/27/18   Manuella Ghazi, Pratik D, DO  nicotine (NICODERM CQ - DOSED IN MG/24 HOURS) 21 mg/24hr patch Place 1 patch (21 mg total) onto the skin daily. 06/15/18   Barton Dubois, MD  omeprazole (PRILOSEC) 20 MG capsule Take 20 mg by mouth daily.    [provider]  thiamine (VITAMIN B-1) 100 MG tablet Take 1 tablet (100 mg total) by mouth daily. Patient not taking: Reported on 06/11/2018 05/28/16   Rosita Fire, MD   Physical Exam: Vitals:   04/25/20 0130 04/25/20 0145 04/25/20 0200 04/25/20 0215  BP:   (!) 149/80 (!) 149/80  Pulse: (!) 110 (!) 121 (!) 107 (!) 108  Resp:    20  Temp:      SpO2: 96% 90% 100% 97%  Weight:      Height:       Constitutional: Looks chronically ill.  NAD, calm, comfortable Eyes: PERRL, lids and conjunctivae normal ENMT: Mucous membranes are mildly dry. Posterior pharynx clear of any exudate or lesions. Neck: normal, supple, no masses, no thyromegaly Respiratory: Decreased breath sounds with bilateral rhonchi and wheezing.  No crackles. Normal respiratory effort. No accessory muscle use.  Cardiovascular: Tachycardic at 108 bpm, no murmurs / rubs / gallops. No extremity edema. 2+ pedal pulses. No carotid bruits.  Abdomen: Nondistended.  Bowel sounds positive.  Soft, no tenderness, no masses palpated. No hepatosplenomegaly.  Positive mild left flank CVA Musculoskeletal: no clubbing / cyanosis.  Good ROM, no contractures. Normal muscle tone.  Skin: no rashes, lesions, ulcers on limited dermatological examination. Neurologic: CN 2-12 grossly intact. Sensation intact, DTR normal. Strength 5/5 in all 4.  Psychiatric: Alert and oriented x 3. Normal mood.   Labs on Admission: I have personally reviewed following labs and imaging studies  CBC: Recent Labs  Lab 04/24/20 2325  WBC 5.7  NEUTROABS 3.4  HGB 14.2  HCT 39.7   MCV 99.5  PLT 417    Basic Metabolic Panel: Recent Labs  Lab 04/24/20 2325  NA 119*  K 4.3  CL 85*  CO2 25  GLUCOSE 101*  BUN 6*  CREATININE 0.79  CALCIUM 9.1  MG 1.5*  PHOS 3.7    GFR: Estimated Creatinine Clearance: 72.8 mL/min (by C-G formula based on SCr of 0.79 mg/dL).  Liver Function Tests: Recent Labs  Lab 04/24/20 2325  AST 45*  ALT 22  ALKPHOS 102  BILITOT 1.0  PROT 7.7  ALBUMIN 3.9   Radiological Exams on Admission: DG Chest Port 1 View  Result Date: 04/24/2020 CLINICAL DATA:  Shortness of breath EXAM: PORTABLE CHEST 1 VIEW COMPARISON:  06/10/2018 FINDINGS: The heart size and mediastinal contours are within normal limits. Both lungs are clear. The visualized skeletal structures are unremarkable. Chronic elevation of the right hemidiaphragm. IMPRESSION: No active disease. Electronically Signed   By: Ulyses Jarred M.D.   On: 04/24/2020 23:59    EKG: Independently reviewed. Vent. rate 98 BPM PR interval * ms QRS duration 77 ms QT/QTc 365/464 ms P-R-T axes 86 87 83  Sinus rhythm Probable left atrial enlargement  Assessment/Plan Principal Problem:   Hyponatremia Secondary to beer potomania. Observation/telemetry. Continue normal saline infusion. Follow-up sodium level. Advised to cease alcohol consumption.  Active Problems:   Alcohol dependence ([place]) CIWA protocol started. Folic acid, MVI and thiamine supplementation. Magnesium sulfate 2 g IVPB given.    Hypomagnesemia Magnesium sulfate 2 g IVPB. Follow-up level as needed. Alcohol cessation recommended.    Tobacco dependence Nicotine replacement therapy ordered. Staff to provide tobacco cessation information.    HTN (hypertension) Hold amlodipine due to tachycardia. Metoprolol 5 mg IVP x1 dose. Monitor blood pressure and heart rate.    GERD (gastroesophageal reflux disease) Resume PPI.   DVT prophylaxis: Lovenox SQ. Code Status:   Full code. Family Communication: Disposition  Plan:   Patient is from:  Home.  Anticipated DC to:  Home.  Anticipated DC date:  04/27/2019 on 04/27/2020.  Anticipated DC barriers: Clinical status.  Consults called:  TOC team. Admission status:  Observation/telemetry.   Severity of Illness: High due to COPD exacerbation due to persistent tobacco use along with alcohol dependence and hyponatremia, likely from beer potomania.  Reubin Milan MD [facility]  How to contact the [facility] Attending or Consulting provider [place] or covering provider during after hours [place], for this patient?   1. Check the care team in [facility] and look for a) attending/consulting [facility] provider listed and b) the [facility] team listed 2. Log into [email or web address] and use [place]'s universal password to access. If you do not have the password, please contact the hospital operator. 3. Locate the [facility] provider you are looking for under [facility] and page to a number that you can be directly reached. 4. If you still have difficulty reaching the provider, please page the [facility] (Director on Call) for the Hospitalists listed on amion for assistance.  04/25/2020, 3:39 AM   This document was prepared using Dragon voice recognition software and may contain some unintended transcription errors.

## 2020-04-25 NOTE — ED Notes (Signed)
EDP notified of Sodium of 119

## 2020-04-25 NOTE — ED Provider Notes (Signed)
[facility] EMERGENCY DEPARTMENT Provider Note   CSN: [id number] Arrival date & time: 04/24/20  2246     History Chief Complaint  Patient presents with   Shortness of Breath    Jesus Walker is a 59 y.o. male.  The history is provided by the patient.  Shortness of Breath Severity:  Moderate Onset quality:  Gradual Timing:  Constant Progression:  Worsening Chronicity:  Recurrent Relieved by: Albuterol and steroids. Worsened by:  Nothing Associated symptoms: chest pain and cough   Associated symptoms: no fever, no hemoptysis and no vomiting   Risk factors: alcohol use   Patient history of COPD, hypertension, alcohol abuse presents with shortness of breath.  Patient reports that he has had increasing cough, wheezing and shortness of breath over the past day.  He also has some pressure in his chest as well.  This feels similar to prior episodes of wheezing.  Patient is a smoker.     Past Medical History:  Diagnosis Date   Alcohol dependence ([place])    Asthma    COPD (chronic obstructive pulmonary disease) ([place])    GERD (gastroesophageal reflux disease)    Hypertension     Patient Active Problem List   Diagnosis Date Noted   Alcohol abuse    Benign essential HTN    Chest tightness 05/27/2018   Hypochloremia 05/27/2018   Serum total bilirubin elevated 05/27/2018   Tobacco abuse 05/27/2018   HTN (hypertension) 05/27/2018   GERD (gastroesophageal reflux disease) 05/27/2018   COPD exacerbation ([place]) 05/26/2018   Acute respiratory failure with hypoxia ([place]) 05/26/2018   Malnutrition of moderate degree 05/23/2016   Hyponatremia 05/21/2016   Hypokalemia 05/21/2016   Mild malnutrition ([place]) 05/21/2016   Thrombocytopenia ([place]) 05/21/2016   Seizure ([place]) 05/21/2016   Alcohol dependence ([place]) 05/21/2016   Tobacco dependence 05/21/2016   Precordial chest pain 12/26/2015   Lung mass 12/26/2015   CAP (community acquired pneumonia) 12/26/2015     Past Surgical History:  Procedure Laterality Date   BRONCHIAL BRUSHINGS  01/03/2016   Procedure: BRONCHIAL BRUSHINGS;  Surgeon: Sinda Du, MD;  [facility];  Service: Cardiopulmonary;;   BRONCHIAL WASHINGS  01/03/2016   Procedure: BRONCHIAL WASHINGS;  Surgeon: Sinda Du, MD;  [facility];  Service: Cardiopulmonary;;   FLEXIBLE BRONCHOSCOPY N/A 01/03/2016   Procedure: FLEXIBLE BRONCHOSCOPY;  Surgeon: Sinda Du, MD;  [facility];  Service: Cardiopulmonary;  Laterality: N/A;       Family History  Problem Relation Age of Onset   Asthma Mother 53   Asthma Father 39    Social History   Tobacco Use   Smoking status: Current Every Day Smoker    Packs/day: 1.00    Types: Cigarettes   Smokeless tobacco: Never Used  Vaping Use   Vaping Use: Never used  Substance Use Topics   Alcohol use: Yes    Alcohol/week: 12.0 standard drinks    Types: 12 Cans of beer per week    Comment: drinks 12 pack + 1/5 wine daily   Drug use: No    Home Medications Prior to Admission medications   Medication Sig Start Date End Date Taking? Authorizing Provider  amLODipine (NORVASC) 5 MG tablet Take 5 mg by mouth daily.    [provider]  budesonide-formoterol (SYMBICORT) 160-4.5 MCG/ACT inhaler Inhale 2 puffs into the lungs 2 (two) times daily. 06/14/18   Barton Dubois, MD  folic acid (FOLVITE) 1 MG tablet Take 1 tablet (1 mg total) by  mouth daily. 05/28/16   Rosita Fire, MD  guaiFENesin (MUCINEX) 600 MG 12 hr tablet Take 1 tablet (600 mg total) by mouth 2 (two) times daily. 06/14/18   Barton Dubois, MD  Ipratropium-Albuterol (COMBIVENT RESPIMAT) 20-100 MCG/ACT AERS respimat Inhale 1 puff into the lungs every 6 (six) hours as needed for wheezing or shortness of breath. 05/27/18   Manuella Ghazi, Pratik D, DO  nicotine (NICODERM CQ - DOSED IN MG/24 HOURS) 21 mg/24hr patch Place 1 patch (21 mg total) onto the skin daily. 06/15/18   Barton Dubois,  MD  omeprazole (PRILOSEC) 20 MG capsule Take 20 mg by mouth daily.    [provider]  predniSONE (DELTASONE) 20 MG tablet Take 3 tablets by mouth daily x2 days; then 2 tablets by mouth daily x2-days; then 1 tablet by mouth daily x3 days; then half tablet by mouth daily x3 days and stop prednisone. 06/14/18   Barton Dubois, MD  thiamine (VITAMIN B-1) 100 MG tablet Take 1 tablet (100 mg total) by mouth daily. Patient not taking: Reported on 06/11/2018 05/28/16   Rosita Fire, MD    Allergies    Patient has no known allergies.  Review of Systems   Review of Systems  Constitutional: Negative for fever.  Respiratory: Positive for cough and shortness of breath. Negative for hemoptysis.   Cardiovascular: Positive for chest pain.  Gastrointestinal: Negative for vomiting.  All other systems reviewed and are negative.   Physical Exam Updated Vital Signs BP (!) 149/95 (BP Location: Right Arm)    Pulse 98    Temp 97.9 F (36.6 C)    Resp 18    Ht 1.575 m (5\' 2" )    Wt 53.1 kg    SpO2 100%    BMI 21.41 kg/m   Physical Exam CONSTITUTIONAL: Chronically ill-appearing, no acute distress HEAD: Normocephalic/atraumatic EYES: EOMI/PERRL ENMT: Mucous membranes moist NECK: supple no meningeal signs, no JVD SPINE/BACK:entire spine nontender CV: S1/S2 noted, no murmurs/rubs/gallops noted LUNGS: Wheezing bilaterally, no acute distress ABDOMEN: soft, nontender, no rebound or guarding, bowel sounds noted throughout abdomen GU:no cva tenderness NEURO: Pt is awake/alert/appropriate, moves all extremitiesx4.  No facial droop.   EXTREMITIES: pulses normal/equal, full ROM, no lower extreme edema or calf tenderness SKIN: warm, color normal PSYCH: no abnormalities of mood noted, alert and oriented to situation  ED Results / Procedures / Treatments   Labs (all labs ordered are listed, but only abnormal results are displayed) Labs Reviewed  BASIC METABOLIC PANEL - Abnormal; Notable for the  following components:      Result Value   Sodium 119 (*)    Chloride 85 (*)    Glucose, Bld 101 (*)    BUN 6 (*)    All other components within normal limits  CBC WITH DIFFERENTIAL/PLATELET - Abnormal; Notable for the following components:   RBC 3.99 (*)    MCH 35.6 (*)    RDW 11.0 (*)    Eosinophils Absolute 0.6 (*)    All other components within normal limits  RESP PANEL BY RT-PCR (FLU A&B, COVID) [id number]    EKG EKG Interpretation  Date/Time:  Tuesday April 24 2020 22:54:43 EST Ventricular Rate:  98 PR Interval:    QRS Duration: 77 QT Interval:  365 QTC Calculation: 464 R Axis:   87 Text Interpretation: Sinus rhythm Probable left atrial enlargement No significant change since last tracing Confirmed by Ripley Fraise [phone]) on 04/24/2020 11:20:10 PM   Radiology DG Chest Port 1 View  Result Date: 04/24/2020  CLINICAL DATA:  Shortness of breath EXAM: PORTABLE CHEST 1 VIEW COMPARISON:  06/10/2018 FINDINGS: The heart size and mediastinal contours are within normal limits. Both lungs are clear. The visualized skeletal structures are unremarkable. Chronic elevation of the right hemidiaphragm. IMPRESSION: No active disease. Electronically Signed   By: Ulyses Jarred M.D.   On: 04/24/2020 23:59    Procedures .Critical Care Performed by: Ripley Fraise, MD Authorized by: Ripley Fraise, MD   Critical care provider statement:    Critical care time (minutes):  45   Critical care start time:  04/25/2020 12:00 AM   Critical care end time:  04/25/2020 12:45 AM   Critical care time was exclusive of:  Separately billable procedures and treating other patients   Critical care was necessary to treat or prevent imminent or life-threatening deterioration of the following conditions:  Respiratory failure, metabolic crisis and dehydration   Critical care was time spent personally by me on the following activities:  Re-evaluation of patient's condition, review of old charts, pulse  oximetry, ordering and review of radiographic studies, ordering and review of laboratory studies, ordering and performing treatments and interventions, discussions with consultants, obtaining history from patient or surrogate, examination of patient, evaluation of patient's response to treatment and development of treatment plan with patient or surrogate   I assumed direction of critical care for this patient from another provider in my specialty: no       Medications Ordered in ED Medications  0.9 %  sodium chloride infusion ( Intravenous New Bag/Given (Non-Interop) 04/25/20 0154)  albuterol (PROVENTIL) (2.5 MG/3ML) 0.083% nebulizer solution 5 mg (5 mg Nebulization Given 04/25/20 0102)  ipratropium (ATROVENT) nebulizer solution 0.5 mg (0.5 mg Nebulization Given 04/25/20 0102)  sodium chloride 0.9 % bolus 1,000 mL (1,000 mLs Intravenous New Bag/Given (Non-Interop) 04/25/20 0107)    ED Course  I have reviewed the triage vital signs and the nursing notes.  Pertinent labs & imaging results that were available during my care of the patient were reviewed by me and considered in my medical decision making (see chart for details).    MDM Rules/Calculators/A&P                          12:23 AM Patient is a smoker with known history of COPD who presents with shortness of breath.  Patient had already received treatments prior to arrival by EMS.  He appears to be improving.  Will give another nebulizer treatment and reassess.  Chest x-ray has been reviewed and is negative 2:07 AM  Patient with recurrent hyponatremia, sodium is now below 120.  Patient has had this previously, likely due to beer potomania. Due to history, COPD exacerbation and sodium findings, patient be admitted to hospital Patient agreed with plan.  1 L normal saline has been ordered, discussed the case with Dr. Olevia Bowens for admission Final Clinical Impression(s) / ED Diagnoses Final diagnoses:  COPD exacerbation ([place])  Hyponatremia     Rx / DC Orders ED Discharge Orders    None       Ripley Fraise, MD 04/25/20 [phone]

## 2020-04-25 NOTE — TOC Initial Note (Signed)
Transition of Care [facility]) - Initial/Assessment Note    Patient Details  Name: Jesus Walker MRN: [id number] Date of Birth: 1961-03-04  [facility] CM/SW Contact:    Natasha Bence, LCSW Phone Number: 04/25/2020, 9:23 AM  Clinical Narrative:                 Patient is a 59 year old male admitted for Hyponatremia. CSW received notification of SA consult. CSW spoke with patient and provided SA resource list. Patient agreeable to review. TOC to follow.     Barriers to Discharge: Continued Medical Work up              Admission diagnosis:  Hyponatremia [E87.1] COPD exacerbation ([place]) [J44.1] Patient Active Problem List   Diagnosis Date Noted  . Hypomagnesemia   . Alcohol abuse   . Benign essential HTN   . Chest tightness 05/27/2018  . Hypochloremia 05/27/2018  . Serum total bilirubin elevated 05/27/2018  . Tobacco abuse 05/27/2018  . HTN (hypertension) 05/27/2018  . GERD (gastroesophageal reflux disease) 05/27/2018  . COPD exacerbation ([place]) 05/26/2018  . Acute respiratory failure with hypoxia ([place]) 05/26/2018  . Malnutrition of moderate degree 05/23/2016  . Hyponatremia 05/21/2016  . Hypokalemia 05/21/2016  . Mild malnutrition ([place]) 05/21/2016  . Thrombocytopenia ([place]) 05/21/2016  . Seizure ([place]) 05/21/2016  . Alcohol dependence ([place]) 05/21/2016  . Tobacco dependence 05/21/2016  . Precordial chest pain 12/26/2015  . 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 CAP (community acquired pneumonia) 12/26/2015   PCP:  Rosita Fire, MD Pharmacy:   [facility], [address] [place] [address] Phone: [phone] Fax: [phone]  [facility] [place] [address] Phone: [phone] Fax: [phone]     Social Determinants of Health (SDOH) Interventions    Readmission Risk Interventions No flowsheet data  found.

## 2020-04-25 NOTE — ED Notes (Signed)
Patient states that he has a constant pressure on left side of chest, but is not in any pain at this time. RN notified.

## 2020-04-25 NOTE — Evaluation (Signed)
Physical Therapy Evaluation Patient Details Name: Jesus Walker MRN: [id number] DOB: February 12, 1961 Today's Date: 04/25/2020   History of Present Illness  Jesus Walker is a 59 y.o. male with medical history significant of alcohol dependence (drinks significant amount of beer daily), tobacco abuse, COPD, GERD, hypertension who is coming to the emergency department due to progressively worse dyspnea associated with wheezing, cough, which is occasionally productive of whitish-looking sputum and pleuritic CP for several days, but particularly worsened yesterday (Tuesday, 04/24/2020).  He denies fever, chills or night sweats.  He denies rhinorrhea, sore throat or hemoptysis.  He denies palpitations, diaphoresis, PND, orthopnea or pitting edema of the lower extremities.  No abdominal pain, nausea, emesis, diarrhea, constipation, melena or hematochezia.  He has had some left flank pain in the past few days along with some mild dysuria, but denies frequency or hematuria.     Clinical Impression  Patient does not demonstrate any limitations in functional mobility, balance, coordination, or limitations to activity tolerance.  Patient able to demonstrate independent bed mobility, transfers, ambulation, and stair negotiation on room air with saturation levels maintained 97-99% and no obvious signs of exertion or fatigue manifested.  Pt educated in activity pacing strategies and pursed-lip breathing. Demonstrates teach back of techniuqes    Follow Up Recommendations No PT follow up    Equipment Recommendations       Recommendations for Other Services       Precautions / Restrictions Precautions Precautions: None Restrictions Weight Bearing Restrictions: No      Mobility  Bed Mobility Overal bed mobility: Independent               Patient Response:  (no adverse response noted)  Transfers Overall transfer level: Independent Equipment used: None                 Ambulation/Gait Ambulation/Gait assistance: Independent Gait Distance (Feet): 300 Feet Assistive device: None Gait Pattern/deviations: WFL(Within Functional Limits)   Gait velocity interpretation: >2.62 ft/sec, indicative of community ambulatory    Stairs Stairs: Yes Stairs assistance: Modified independent (Device/Increase time) Stair Management: One rail Right Number of Stairs: 4    Wheelchair Mobility    Modified Rankin (Stroke Patients Only)       Balance Overall balance assessment: Independent                                           Pertinent Vitals/Pain Pain Assessment: No/denies pain    Home Living Family/patient expects to be discharged to:: Private residence Living Arrangements: Non-relatives/Friends;Spouse/significant other Available Help at Discharge: Family;Available PRN/intermittently Type of Home: Mobile home Home Access: Ramped entrance     Home Layout: One level Home Equipment: Cane - single point;Shower seat      Prior Function Level of Independence: Independent with assistive device(s)   Gait / Transfers Assistance Needed: Uses SPC occasionally   ADL's / Homemaking Assistance Needed: Pt is independent mostly, girlfriend assists as needed for getting in/out of the tub        Hand Dominance   Dominant Hand: Right    Extremity/Trunk Assessment   Upper Extremity Assessment Upper Extremity Assessment: Overall WFL for tasks assessed    Lower Extremity Assessment Lower Extremity Assessment: Overall WFL for tasks assessed    Cervical / Trunk Assessment Cervical / Trunk Assessment: Normal  Communication   Communication: No difficulties  Cognition Arousal/Alertness: Awake/alert Behavior  During Therapy: WFL for tasks assessed/performed Overall Cognitive Status: Within Functional Limits for tasks assessed                                        General Comments      Exercises      Assessment/Plan    PT Assessment Patent does not need any further PT services  PT Problem List         PT Treatment Interventions      PT Goals (Current goals can be found in the Care Plan section)  Acute Rehab PT Goals Patient Stated Goal: "Get back to my home" PT Goal Formulation: With patient Time For Goal Achievement: 04/25/20 Potential to Achieve Goals: Good    Frequency     Barriers to discharge        Co-evaluation               AM-PAC PT "6 Clicks" Mobility  Outcome Measure Help needed turning from your back to your side while in a flat bed without using bedrails?: None Help needed moving from lying on your back to sitting on the side of a flat bed without using bedrails?: None Help needed moving to and from a bed to a chair (including a wheelchair)?: None Help needed standing up from a chair using your arms (e.g., wheelchair or bedside chair)?: None Help needed to walk in hospital room?: None Help needed climbing 3-5 steps with a railing? : None 6 Click Score: 24    End of Session   Activity Tolerance: Patient tolerated treatment well Patient left: in chair;with call bell/phone within reach Nurse Communication: Mobility status PT Visit Diagnosis: Unsteadiness on feet (R26.81);Muscle weakness (generalized) (M62.81);Other abnormalities of gait and mobility (R26.89)    Time: 4604-7998 PT Time Calculation (min) (ACUTE ONLY): 18 min   Charges:   PT Evaluation $PT Eval Low Complexity: 1 Low PT Treatments $Gait Training: 8-22 mins       9:29 AM, 04/25/20 M. Sherlyn Lees, PT, DPT Physical Therapist- [place] Office Number: [phone]

## 2020-04-25 NOTE — Progress Notes (Signed)
Subjective: Patient admitted this morning, see detailed H&P by Dr Olevia Bowens 59 year old male with history of alcohol dependence, tobacco abuse, COPD, GERD, hypertension came to ED due to progressively worse dyspnea associated with cough, wheezing.  In the ED lab work showed sodium 119.  Chest x-ray was unremarkable.  Vitals:   04/25/20 1344 04/25/20 1353  BP:  (!) 158/82  Pulse:  (!) 104  Resp:  18  Temp:  (!) 97.4 F (36.3 C)  SpO2: 96% 100%      A/P Hyponatremia-secondary to beer portal mania, started on IV normal saline.  Sodium has improved to 125.  Will start fluid restriction 1500 mL per day.  Alcohol dependence-no signs of alcohol withdrawal, continue CIWA protocol.  Hypomagnesemia-patient given magnesium sulfate 2 g IV x1.  Follow magnesium level in a.m.  Hypertension-blood pressure is now elevated, will restart amlodipine 5 mg daily.    Tobacco dependence-nicotine replacement therapy ordered.    [facility] [phone]

## 2020-04-26 DIAGNOSIS — K219 Gastro-esophageal reflux disease without esophagitis: Secondary | ICD-10-CM | POA: Diagnosis present

## 2020-04-26 DIAGNOSIS — D72829 Elevated white blood cell count, unspecified: Secondary | ICD-10-CM | POA: Diagnosis not present

## 2020-04-26 DIAGNOSIS — Z7951 Long term (current) use of inhaled steroids: Secondary | ICD-10-CM | POA: Diagnosis not present

## 2020-04-26 DIAGNOSIS — Z825 Family history of asthma and other chronic lower respiratory diseases: Secondary | ICD-10-CM | POA: Diagnosis not present

## 2020-04-26 DIAGNOSIS — F1020 Alcohol dependence, uncomplicated: Secondary | ICD-10-CM | POA: Diagnosis present

## 2020-04-26 DIAGNOSIS — E871 Hypo-osmolality and hyponatremia: Secondary | ICD-10-CM | POA: Diagnosis not present

## 2020-04-26 DIAGNOSIS — T380X5A Adverse effect of glucocorticoids and synthetic analogues, initial encounter: Secondary | ICD-10-CM | POA: Diagnosis not present

## 2020-04-26 DIAGNOSIS — J209 Acute bronchitis, unspecified: Secondary | ICD-10-CM | POA: Diagnosis present

## 2020-04-26 DIAGNOSIS — F17210 Nicotine dependence, cigarettes, uncomplicated: Secondary | ICD-10-CM | POA: Diagnosis present

## 2020-04-26 DIAGNOSIS — I10 Essential (primary) hypertension: Secondary | ICD-10-CM | POA: Diagnosis present

## 2020-04-26 DIAGNOSIS — Y92239 Unspecified place in hospital as the place of occurrence of the external cause: Secondary | ICD-10-CM | POA: Diagnosis not present

## 2020-04-26 DIAGNOSIS — J440 Chronic obstructive pulmonary disease with acute lower respiratory infection: Secondary | ICD-10-CM | POA: Diagnosis present

## 2020-04-26 DIAGNOSIS — K5900 Constipation, unspecified: Secondary | ICD-10-CM | POA: Diagnosis present

## 2020-04-26 DIAGNOSIS — K2970 Gastritis, unspecified, without bleeding: Secondary | ICD-10-CM | POA: Diagnosis present

## 2020-04-26 DIAGNOSIS — J441 Chronic obstructive pulmonary disease with (acute) exacerbation: Secondary | ICD-10-CM

## 2020-04-26 DIAGNOSIS — Z20822 Contact with and (suspected) exposure to covid-19: Secondary | ICD-10-CM | POA: Diagnosis present

## 2020-04-26 DIAGNOSIS — Z79899 Other long term (current) drug therapy: Secondary | ICD-10-CM | POA: Diagnosis not present

## 2020-04-26 LAB — BASIC METABOLIC PANEL
Anion gap: 7 (ref 5–15)
BUN: 13 mg/dL (ref 8–23)
CO2: 26 mmol/L (ref 22–32)
Calcium: 9.1 mg/dL (ref 8.9–10.3)
Chloride: 94 mmol/L — ABNORMAL LOW (ref 98–111)
Creatinine, Ser: 0.85 mg/dL (ref 0.61–1.24)
GFR, Estimated: >60 mL/min (ref >60)
Glucose, Bld: 126 mg/dL — ABNORMAL HIGH (ref 70–99)
Potassium: 4.5 mmol/L (ref 3.5–5.1)
Sodium: 127 mmol/L — ABNORMAL LOW (ref 135–145)

## 2020-04-26 LAB — CBC
HCT: 40.5 % (ref 39.0–52.0)
Hemoglobin: 14 g/dL (ref 13.0–17.0)
MCH: 35.4 pg — ABNORMAL HIGH (ref 26.0–34.0)
MCHC: 34.6 g/dL (ref 30.0–36.0)
MCV: 102.3 fL — ABNORMAL HIGH (ref 80.0–100.0)
Platelets: 165 10*3/uL (ref 150–400)
RBC: 3.96 MIL/uL — ABNORMAL LOW (ref 4.22–5.81)
RDW: 11.4 % — ABNORMAL LOW (ref 11.5–15.5)
WBC: 16.4 10*3/uL — ABNORMAL HIGH (ref 4.0–10.5)
nRBC: 0 % (ref 0.0–0.2)

## 2020-04-26 LAB — MAGNESIUM: Magnesium: 1.6 mg/dL — ABNORMAL LOW (ref 1.7–2.4)

## 2020-04-26 LAB — HIV ANTIBODY (ROUTINE TESTING W REFLEX): HIV Screen 4th Generation wRfx: NONREACTIVE

## 2020-04-26 LAB — GLUCOSE, CAPILLARY
Glucose-Capillary: 84 mg/dL (ref 70–99)
Glucose-Capillary: 181 mg/dL — ABNORMAL HIGH (ref 70–99)

## 2020-04-26 MED ORDER — IPRATROPIUM-ALBUTEROL 0.5-2.5 (3) MG/3ML IN SOLN
3.0000 mL | Freq: Two times a day (BID) | RESPIRATORY_TRACT | Status: DC
  Administered 2020-04-27: 3 mL via RESPIRATORY_TRACT
  Filled 2020-04-26: qty 3

## 2020-04-26 MED ORDER — FAMOTIDINE 20 MG PO TABS
20.0000 mg | ORAL_TABLET | Freq: Every day | ORAL | Status: DC
  Administered 2020-04-26 – 2020-04-27 (×2): 20 mg via ORAL
  Filled 2020-04-26: qty 1

## 2020-04-26 MED ORDER — DOCUSATE SODIUM 100 MG PO CAPS
100.0000 mg | ORAL_CAPSULE | Freq: Two times a day (BID) | ORAL | Status: DC
  Administered 2020-04-26 – 2020-04-27 (×3): 100 mg via ORAL
  Filled 2020-04-26 (×3): qty 1

## 2020-04-26 MED ORDER — POLYETHYLENE GLYCOL 3350 17 G PO PACK
17.0000 g | PACK | Freq: Every day | ORAL | Status: DC
  Administered 2020-04-26 – 2020-04-27 (×2): 17 g via ORAL
  Filled 2020-04-26 (×2): qty 1

## 2020-04-26 NOTE — Plan of Care (Signed)

## 2020-04-26 NOTE — Progress Notes (Addendum)
[facility]  PROGRESS NOTE  Jesus Walker [id number]:[id number] DOB: 10/29/1960 DOA: 04/24/2020 PCP: Rosita Fire, MD   Brief HPI:   *59 year old male with history of alcohol dependence, tobacco abuse, COPD, GERD, hypertension came to ED due to progressively worse dyspnea associated with cough, wheezing.  In the ED lab work showed sodium 119.  Chest x-ray was unremarkable.   Subjective   Patient seen and examined, complains of constipation.  Sodium is improved 127.   Assessment/Plan:     1. Hyponatremia-secondary to beer poto mania.  Sodium has improved to 127, continue fluid restriction 1500 cc/day.  Follow BMP in am. 2. Constipation-start MiraLAX 17 g daily, Colace 100 mg p.o. twice daily. 3. Alcohol dependence-no signs of alcohol withdrawal, continue CIWA protocol. 4. Hypomagnesemia-magnesium was 1.5, it was replaced.  Will check serum magnesium level today. 5. Tobacco dependence-nicotine replacement ordered 6. Hypertension-blood pressure stable, continue amlodipine 5 mg daily. 7. Acute bronchitis-patient was started on Solu-Medrol taper and now on  prednisone 40 mg daily will discontinue prednisone.  8. GERD/gastritis-patient complained of abdominal pain this morning, will discontinue prednisone as above.  Start Pepcid 20 mg p.o. daily. 9. Leukocytosis-WBC 16,000 today likely from steroids as above.  Prednisone has been discontinued.  Follow CBC in a.m.     COVID-19 Labs  No results for input(s): DDIMER, FERRITIN, LDH, CRP in the last 72 hours.  Lab Results  Component Value Date   [place] NEGATIVE 04/24/2020     Scheduled medications:   . amLODipine  5 mg Oral Daily  . docusate sodium  100 mg Oral BID  . enoxaparin (LOVENOX) injection  40 mg Subcutaneous Q24H  . folic acid  1 mg Oral Daily  . ipratropium-albuterol  3 mL Nebulization TID  . LORazepam  0-4 mg Intravenous Q6H   Followed by  . [START ON 04/27/2020] LORazepam  0-4 mg Intravenous Q12H  .  multivitamin with minerals  1 tablet Oral Daily  . nicotine  21 mg Transdermal Daily  . pantoprazole  40 mg Oral Daily  . polyethylene glycol  17 g Oral Daily  . predniSONE  40 mg Oral Q breakfast  . thiamine  100 mg Oral Daily   Or  . thiamine  100 mg Intravenous Daily         CBG: Recent Labs  Lab 04/25/20 0828 04/25/20 1110 04/25/20 2014 04/26/20 0742 04/26/20 1138  GLUCAP 146* 234* 139* 84 181*    SpO2: 96 % O2 Flow Rate (L/min): 2 L/min    CBC: Recent Labs  Lab 04/24/20 2325 04/25/20 0854 04/26/20 0616  WBC 5.7 3.2* 16.4*  NEUTROABS 3.4  --   --   HGB 14.2 15.8 14.0  HCT 39.7 44.3 40.5  MCV 99.5 100.5* 102.3*  PLT 179 224 756    Basic Metabolic Panel: Recent Labs  Lab 04/24/20 2325 04/25/20 0848 04/26/20 0616  NA 119* 125* 127*  K 4.3 4.5 4.5  CL 85* 92* 94*  CO2 25 24 26   GLUCOSE 101* 142* 126*  BUN 6* 7* 13  CREATININE 0.79 0.77 0.85  CALCIUM 9.1 9.2 9.1  MG 1.5*  --   --   PHOS 3.7  --   --      Liver Function Tests: Recent Labs  Lab 04/24/20 2325  AST 45*  ALT 22  ALKPHOS 102  BILITOT 1.0  PROT 7.7  ALBUMIN 3.9     Antibiotics: Anti-infectives (From admission, onward)   None  DVT prophylaxis: Lovenox  Code Status: Full code  Family Communication: No family at bedside   Consultants:    Procedures:      Objective   Vitals:   04/25/20 2018 04/25/20 2034 04/26/20 0220 04/26/20 0744  BP: (!) 167/73  (!) 163/103   Pulse: 97  (!) 101   Resp: 20  20   Temp: 98 F (36.7 C)  97.7 F (36.5 C)   TempSrc: Oral  Oral   SpO2: 98% 96% 100% 96%  Weight:      Height:        Intake/Output Summary (Last 24 hours) at 04/26/2020 1244 Last data filed at 04/26/2020 0900 Gross per 24 hour  Intake 596 ml  Output --  Net 596 ml    11/23 1901 - 11/25 0700 In: 1265.2 [P.O.:472; I.V.:793.2] Out: -   Filed Weights   04/24/20 2301  Weight: 53.1 kg    Physical Examination:   General-appears in no acute  distress Heart-S1-S2, regular, no murmur auscultated Lungs-clear to auscultation bilaterally, no wheezing or crackles auscultated Abdomen-soft, nontender, no organomegaly Extremities-no edema in the lower extremities Neuro-alert, oriented x3, no focal deficit noted   Status is: Inpatient  Dispo: The patient is from: Home              Anticipated d/c is to: Home              Anticipated d/c date is: 04/30/2020              Patient currently not medically stable for discharge  Barrier to discharge-hyponatremia       Data Reviewed:   Recent Results (from the past 240 hour(s))  Resp Panel by RT-PCR (Flu A&B, Covid)     Status: None   Collection Time: 04/24/20 11:54 PM   Specimen: Nasopharyngeal(NP) swabs in vial transport medium  Result Value Ref Range Status   SARS Coronavirus 2 by RT PCR NEGATIVE NEGATIVE Final    Comment: (NOTE) SARS-CoV-2 target nucleic acids are NOT DETECTED.  The SARS-CoV-2 RNA is generally detectable in upper respiratory specimens during the acute phase of infection. The lowest concentration of SARS-CoV-2 viral copies this assay can detect is 138 copies/mL. A negative result does not preclude SARS-Cov-2 infection and should not be used as the sole basis for treatment or other patient management decisions. A negative result may occur with  improper specimen collection/handling, submission of specimen other than nasopharyngeal swab, presence of viral mutation(s) within the areas targeted by this assay, and inadequate number of viral copies(<138 copies/mL). A negative result must be combined with clinical observations, patient history, and epidemiological information. The expected result is Negative.  Fact Sheet for Patients:  EntrepreneurPulse.com.au  Fact Sheet for Healthcare Providers:  IncredibleEmployment.be  This test is no t yet approved or cleared by the [place] FDA and  has been authorized for  detection and/or diagnosis of SARS-CoV-2 by FDA under an Emergency Use Authorization (EUA). This EUA will remain  in effect (meaning this test can be used) for the duration of the COVID-19 declaration under Section 564(b)(1) of the Act, 21 U.S.C.section 360bbb-3(b)(1), unless the authorization is terminated  or revoked sooner.       Influenza A by PCR NEGATIVE NEGATIVE Final   Influenza B by PCR NEGATIVE NEGATIVE Final    Comment: (NOTE) The Xpert Xpress SARS-CoV-2/FLU/RSV plus assay is intended as an aid in the diagnosis of influenza from Nasopharyngeal swab specimens and should not be used as a sole  basis for treatment. Nasal washings and aspirates are unacceptable for Xpert Xpress SARS-CoV-2/FLU/RSV testing.  Fact Sheet for Patients: EntrepreneurPulse.com.au  Fact Sheet for Healthcare Providers: IncredibleEmployment.be  This test is not yet approved or cleared by the [place] FDA and has been authorized for detection and/or diagnosis of SARS-CoV-2 by FDA under an Emergency Use Authorization (EUA). This EUA will remain in effect (meaning this test can be used) for the duration of the COVID-19 declaration under Section 564(b)(1) of the Act, 21 U.S.C. section 360bbb-3(b)(1), unless the authorization is terminated or revoked.  Performed at [facility], [address] [place], [place] [address]     No results for input(s): LIPASE, AMYLASE in the last 168 hours. No results for input(s): AMMONIA in the last 168 hours.  Cardiac Enzymes: No results for input(s): CKTOTAL, CKMB, CKMBINDEX, TROPONINI in the last 168 hours. BNP (last 3 results) No results for input(s): BNP in the last 8760 hours.  ProBNP (last 3 results) No results for input(s): PROBNP in the last 8760 hours.  Studies:  DG Chest Port 1 View  Result Date: 04/24/2020 CLINICAL DATA:  Shortness of breath EXAM: PORTABLE CHEST 1 VIEW COMPARISON:  06/10/2018 FINDINGS: The  heart size and mediastinal contours are within normal limits. Both lungs are clear. The visualized skeletal structures are unremarkable. Chronic elevation of the right hemidiaphragm. IMPRESSION: No active disease. Electronically Signed   By: Ulyses Jarred M.D.   On: 04/24/2020 23:59       [place]   [facility] If 7PM-7AM, please contact night-coverage at [email or web address], Office  [phone]   04/26/2020, 12:44 PM  LOS: 0 days

## 2020-04-27 DIAGNOSIS — E871 Hypo-osmolality and hyponatremia: Secondary | ICD-10-CM | POA: Diagnosis not present

## 2020-04-27 DIAGNOSIS — I10 Essential (primary) hypertension: Secondary | ICD-10-CM | POA: Diagnosis not present

## 2020-04-27 DIAGNOSIS — J441 Chronic obstructive pulmonary disease with (acute) exacerbation: Secondary | ICD-10-CM | POA: Diagnosis not present

## 2020-04-27 LAB — COMPREHENSIVE METABOLIC PANEL
ALT: 18 U/L (ref 0–44)
AST: 22 U/L (ref 15–41)
Albumin: 3.1 g/dL — ABNORMAL LOW (ref 3.5–5.0)
Alkaline Phosphatase: 70 U/L (ref 38–126)
Anion gap: 8 (ref 5–15)
BUN: 13 mg/dL (ref 8–23)
CO2: 28 mmol/L (ref 22–32)
Calcium: 9.1 mg/dL (ref 8.9–10.3)
Chloride: 92 mmol/L — ABNORMAL LOW (ref 98–111)
Creatinine, Ser: 0.83 mg/dL (ref 0.61–1.24)
GFR, Estimated: >60 mL/min (ref >60)
Glucose, Bld: 91 mg/dL (ref 70–99)
Potassium: 3.8 mmol/L (ref 3.5–5.1)
Sodium: 128 mmol/L — ABNORMAL LOW (ref 135–145)
Total Bilirubin: 0.7 mg/dL (ref 0.3–1.2)
Total Protein: 6 g/dL — ABNORMAL LOW (ref 6.5–8.1)

## 2020-04-27 LAB — CBC
HCT: 39.1 % (ref 39.0–52.0)
Hemoglobin: 13.7 g/dL (ref 13.0–17.0)
MCH: 35.5 pg — ABNORMAL HIGH (ref 26.0–34.0)
MCHC: 35 g/dL (ref 30.0–36.0)
MCV: 101.3 fL — ABNORMAL HIGH (ref 80.0–100.0)
Platelets: 183 10*3/uL (ref 150–400)
RBC: 3.86 MIL/uL — ABNORMAL LOW (ref 4.22–5.81)
RDW: 11.7 % (ref 11.5–15.5)
WBC: 7.8 10*3/uL (ref 4.0–10.5)
nRBC: 0 % (ref 0.0–0.2)

## 2020-04-27 LAB — GLUCOSE, CAPILLARY
Glucose-Capillary: 71 mg/dL (ref 70–99)
Glucose-Capillary: 103 mg/dL — ABNORMAL HIGH (ref 70–99)

## 2020-04-27 MED ORDER — THIAMINE HCL 100 MG PO TABS
100.0000 mg | ORAL_TABLET | Freq: Every day | ORAL | 0 refills | Status: DC
Start: 2020-04-28 — End: 2021-06-04

## 2020-04-27 MED ORDER — BISACODYL 10 MG RE SUPP
10.0000 mg | Freq: Once | RECTAL | Status: AC
  Administered 2020-04-27: 10 mg via RECTAL
  Filled 2020-04-27: qty 1

## 2020-04-27 MED ORDER — MAGNESIUM SULFATE 2 GM/50ML IV SOLN
2.0000 g | Freq: Once | INTRAVENOUS | Status: AC
  Administered 2020-04-27: 2 g via INTRAVENOUS
  Filled 2020-04-27: qty 50

## 2020-04-27 NOTE — Plan of Care (Signed)

## 2020-04-27 NOTE — Plan of Care (Signed)
  Problem: Education: Goal: Knowledge of disease or condition will improve 04/27/2020 1504 by Cameron Ali, RN Outcome: Progressing 04/27/2020 0812 by Cameron Ali, RN Outcome: Progressing Goal: Understanding of discharge needs will improve 04/27/2020 1504 by Cameron Ali, RN Outcome: Progressing 04/27/2020 0812 by Cameron Ali, RN Outcome: Progressing   Problem: Health Behavior/Discharge Planning: Goal: Ability to identify changes in lifestyle to reduce recurrence of condition will improve 04/27/2020 1504 by Cameron Ali, RN Outcome: Progressing 04/27/2020 0812 by Cameron Ali, RN Outcome: Progressing Goal: Identification of resources available to assist in meeting health care needs will improve 04/27/2020 1504 by Cameron Ali, RN Outcome: Progressing 04/27/2020 0812 by Cameron Ali, RN Outcome: Progressing   Problem: Physical Regulation: Goal: Complications related to the disease process, condition or treatment will be avoided or minimized 04/27/2020 1504 by Cameron Ali, RN Outcome: Progressing 04/27/2020 0812 by Cameron Ali, RN Outcome: Progressing   Problem: Safety: Goal: Ability to remain free from injury will improve 04/27/2020 1504 by Cameron Ali, RN Outcome: Progressing 04/27/2020 0812 by Cameron Ali, RN Outcome: Progressing

## 2020-04-27 NOTE — Discharge Summary (Signed)
Physician Discharge Summary  Jesus Walker [id number] DOB: [birth year] DOA: 04/24/2020  PCP: Rosita Fire, MD  Admit date: 04/24/2020 Discharge date: 04/27/2020  Time spent: 45 minutes  Recommendations for Outpatient Follow-up:  1. Follow-up PCP in 2 weeks   Discharge Diagnoses:  Principal Problem:   Hyponatremia Active Problems:   Alcohol dependence (HCC)   Tobacco dependence   HTN (hypertension)   GERD (gastroesophageal reflux disease)   Hypomagnesemia   Discharge Condition: Stable  Diet recommendation: Heart healthy diet  Filed Weights   04/24/20 2301  Weight: 53.1 kg    History of present illness:  59 year old male with history of alcohol dependence, tobacco abuse, COPD, GERD, hypertension came to ED due to progressively worse dyspnea associated with cough, wheezing. In the ED lab work showed sodium 119. Chest x-ray was unremarkable  Hospital Course:  1. Hyponatremia-secondary to beer poto mania.  Sodium has improved to 128.  Currently at baseline, patient sodium remains around 125.  Advised not to drink beer. 2. Constipation-patient was given MiraLAX and Colace.  No improvement.  Will give 1 dose of Dulcolax suppository 10 mg PR x1 before discharge. 3. Alcohol dependence-no signs of alcohol withdrawal,  CIWA protocol was discontinued. 4. Hypomagnesemia-magnesium was 1.6.  Will replace magnesium 2 g mag sulfate x1 before discharge.   5. Tobacco dependence-nicotine replacement ordered 6. Hypertension-blood pressure stable, continue amlodipine 5 mg daily. 7. Acute bronchitis-resolved, patient was started on Solu-Medrol taper and now on  prednisone 40 mg daily will discontinue prednisone.   Continue albuterol as needed. 8. GERD/gastritis-patient complained of abdominal pain, resolved after stopping prednisone and starting Pepcid.  Patient will take omeprazole 20 mg daily as outpatient. 9. Leukocytosis-WBC 16,000 today likely from steroids as above.  Prednisone has  been discontinued.  WBC is 7.8 this morning.  Procedures:    Consultations:    Discharge Exam: Vitals:   04/27/20 0458 04/27/20 0826  BP: 134/68   Pulse: 88   Resp: 16   Temp: 99.5 F (37.5 C)   SpO2: 97% 96%    General: Appears in no acute distress Cardiovascular: S1-S2, regular Respiratory: Clear to auscultation bilaterally  Discharge Instructions   Discharge Instructions    Diet - low sodium heart healthy   Complete by: As directed    Increase activity slowly   Complete by: As directed      Allergies as of 04/27/2020   No Known Allergies     Medication List    STOP taking these medications   guaiFENesin 600 MG 12 hr tablet Commonly known as: MUCINEX   predniSONE 20 MG tablet Commonly known as: Deltasone     TAKE these medications   amLODipine 5 MG tablet Commonly known as: NORVASC Take 5 mg by mouth daily.   budesonide-formoterol 160-4.5 MCG/ACT inhaler Commonly known as: Symbicort Inhale 2 puffs into the lungs 2 (two) times daily.   nicotine 21 mg/24hr patch Commonly known as: NICODERM CQ - dosed in mg/24 hours Place 1 patch (21 mg total) onto the skin daily.   omeprazole 20 MG capsule Commonly known as: PRILOSEC Take 20 mg by mouth daily.   ProAir HFA 108 (90 Base) MCG/ACT inhaler Generic drug: albuterol Inhale 2 puffs into the lungs 4 (four) times daily.   thiamine 100 MG tablet Take 1 tablet (100 mg total) by mouth daily. Start taking on: April 28, 2020      No Known Allergies    The results of significant diagnostics from this hospitalization (including imaging, microbiology,  ancillary and laboratory) are listed below for reference.    Significant Diagnostic Studies: DG Chest Port 1 View  Result Date: 04/24/2020 CLINICAL DATA:  Shortness of breath EXAM: PORTABLE CHEST 1 VIEW COMPARISON:  06/10/2018 FINDINGS: The heart size and mediastinal contours are within normal limits. Both lungs are clear. The visualized skeletal  structures are unremarkable. Chronic elevation of the right hemidiaphragm. IMPRESSION: No active disease. Electronically Signed   By: Ulyses Jarred M.D.   On: 04/24/2020 23:59    Microbiology: Recent Results (from the past 240 hour(s))  Resp Panel by RT-PCR (Flu A&B, Covid)     Status: None   Collection Time: 04/24/20 11:54 PM   Specimen: Nasopharyngeal(NP) swabs in vial transport medium  Result Value Ref Range Status   SARS Coronavirus 2 by RT PCR NEGATIVE NEGATIVE Final    Comment: (NOTE) SARS-CoV-2 target nucleic acids are NOT DETECTED.  The SARS-CoV-2 RNA is generally detectable in upper respiratory specimens during the acute phase of infection. The lowest concentration of SARS-CoV-2 viral copies this assay can detect is 138 copies/mL. A negative result does not preclude SARS-Cov-2 infection and should not be used as the sole basis for treatment or other patient management decisions. A negative result may occur with  improper specimen collection/handling, submission of specimen other than nasopharyngeal swab, presence of viral mutation(s) within the areas targeted by this assay, and inadequate number of viral copies(<138 copies/mL). A negative result must be combined with clinical observations, patient history, and epidemiological information. The expected result is Negative.  Fact Sheet for Patients:  EntrepreneurPulse.com.au  Fact Sheet for Healthcare Providers:  IncredibleEmployment.be  This test is no t yet approved or cleared by the [place] FDA and  has been authorized for detection and/or diagnosis of SARS-CoV-2 by FDA under an Emergency Use Authorization (EUA). This EUA will remain  in effect (meaning this test can be used) for the duration of the COVID-19 declaration under Section 564(b)(1) of the Act, 21 U.S.C.section 360bbb-3(b)(1), unless the authorization is terminated  or revoked sooner.       Influenza A by PCR  NEGATIVE NEGATIVE Final   Influenza B by PCR NEGATIVE NEGATIVE Final    Comment: (NOTE) The Xpert Xpress SARS-CoV-2/FLU/RSV plus assay is intended as an aid in the diagnosis of influenza from Nasopharyngeal swab specimens and should not be used as a sole basis for treatment. Nasal washings and aspirates are unacceptable for Xpert Xpress SARS-CoV-2/FLU/RSV testing.  Fact Sheet for Patients: EntrepreneurPulse.com.au  Fact Sheet for Healthcare Providers: IncredibleEmployment.be  This test is not yet approved or cleared by the [place] FDA and has been authorized for detection and/or diagnosis of SARS-CoV-2 by FDA under an Emergency Use Authorization (EUA). This EUA will remain in effect (meaning this test can be used) for the duration of the COVID-19 declaration under Section 564(b)(1) of the Act, 21 U.S.C. section 360bbb-3(b)(1), unless the authorization is terminated or revoked.  Performed at [facility], [address] [place], [place] [address]      Labs: Basic Metabolic Panel: Recent Labs  Lab 04/24/20 2325 04/25/20 0848 04/26/20 0616 04/26/20 1641 04/27/20 0656  NA 119* 125* 127*  --  128*  K 4.3 4.5 4.5  --  3.8  CL 85* 92* 94*  --  92*  CO2 25 24 26   --  28  GLUCOSE 101* 142* 126*  --  91  BUN 6* 7* 13  --  13  CREATININE 0.79 0.77 0.85  --  0.83  CALCIUM  9.1 9.2 9.1  --  9.1  MG 1.5*  --   --  1.6*  --   PHOS 3.7  --   --   --   --    Liver Function Tests: Recent Labs  Lab 04/24/20 2325 04/27/20 0656  AST 45* 22  ALT 22 18  ALKPHOS 102 70  BILITOT 1.0 0.7  PROT 7.7 6.0*  ALBUMIN 3.9 3.1*   No results for input(s): LIPASE, AMYLASE in the last 168 hours. No results for input(s): AMMONIA in the last 168 hours. CBC: Recent Labs  Lab 04/24/20 2325 04/25/20 0854 04/26/20 0616 04/27/20 0656  WBC 5.7 3.2* 16.4* 7.8  NEUTROABS 3.4  --   --   --   HGB 14.2 15.8 14.0 13.7  HCT 39.7 44.3 40.5 39.1  MCV 99.5  100.5* 102.3* 101.3*  PLT 179 224 165 183    CBG: Recent Labs  Lab 04/25/20 2014 04/26/20 0742 04/26/20 1138 04/27/20 0841 04/27/20 1125  GLUCAP 139* 84 181* 71 103*       Signed:  Oswald Hillock MD.  [facility] 04/27/2020, 2:34 PM

## 2020-04-27 NOTE — Progress Notes (Signed)
Discharge instructions given to patient. Answered all questions at this time. All belongings sent with patient. Family member to pick up patient .

## 2020-05-03 ENCOUNTER — Emergency Department (HOSPITAL_COMMUNITY)
Admission: EM | Admit: 2020-05-03 | Discharge: 2020-05-03 | Disposition: A | Discharge status: Home / Self Care | Payer: Medicaid Other | Attending: Emergency Medicine | Admitting: Emergency Medicine

## 2020-05-03 ENCOUNTER — Encounter (HOSPITAL_COMMUNITY): Payer: Self-pay | Admitting: *Deleted

## 2020-05-03 ENCOUNTER — Other Ambulatory Visit: Payer: Self-pay

## 2020-05-03 ENCOUNTER — Emergency Department (HOSPITAL_COMMUNITY): Payer: Medicaid Other

## 2020-05-03 DIAGNOSIS — Z20822 Contact with and (suspected) exposure to covid-19: Secondary | ICD-10-CM | POA: Diagnosis not present

## 2020-05-03 DIAGNOSIS — Z79899 Other long term (current) drug therapy: Secondary | ICD-10-CM | POA: Insufficient documentation

## 2020-05-03 DIAGNOSIS — E871 Hypo-osmolality and hyponatremia: Secondary | ICD-10-CM | POA: Diagnosis not present

## 2020-05-03 DIAGNOSIS — F17210 Nicotine dependence, cigarettes, uncomplicated: Secondary | ICD-10-CM | POA: Insufficient documentation

## 2020-05-03 DIAGNOSIS — I10 Essential (primary) hypertension: Secondary | ICD-10-CM | POA: Insufficient documentation

## 2020-05-03 DIAGNOSIS — R0602 Shortness of breath: Secondary | ICD-10-CM | POA: Diagnosis present

## 2020-05-03 DIAGNOSIS — J441 Chronic obstructive pulmonary disease with (acute) exacerbation: Secondary | ICD-10-CM | POA: Insufficient documentation

## 2020-05-03 DIAGNOSIS — J45909 Unspecified asthma, uncomplicated: Secondary | ICD-10-CM | POA: Diagnosis not present

## 2020-05-03 LAB — CBC WITH DIFFERENTIAL/PLATELET
Abs Immature Granulocytes: 0.05 10*3/uL (ref 0.00–0.07)
Basophils Absolute: 0.1 10*3/uL (ref 0.0–0.1)
Basophils Relative: 1 %
Eosinophils Absolute: 0.2 10*3/uL (ref 0.0–0.5)
Eosinophils Relative: 3 %
HCT: 42.8 % (ref 39.0–52.0)
Hemoglobin: 14 g/dL (ref 13.0–17.0)
Immature Granulocytes: 1 %
Lymphocytes Relative: 8 %
Lymphs Abs: 0.6 10*3/uL — ABNORMAL LOW (ref 0.7–4.0)
MCH: 35.7 pg — ABNORMAL HIGH (ref 26.0–34.0)
MCHC: 32.7 g/dL (ref 30.0–36.0)
MCV: 109.2 fL — ABNORMAL HIGH (ref 80.0–100.0)
Monocytes Absolute: 0.2 10*3/uL (ref 0.1–1.0)
Monocytes Relative: 2 %
Neutro Abs: 6.7 10*3/uL (ref 1.7–7.7)
Neutrophils Relative %: 85 %
Platelets: 197 10*3/uL (ref 150–400)
RBC: 3.92 MIL/uL — ABNORMAL LOW (ref 4.22–5.81)
RDW: 11.6 % (ref 11.5–15.5)
WBC: 7.8 10*3/uL (ref 4.0–10.5)
nRBC: 0 % (ref 0.0–0.2)

## 2020-05-03 LAB — RESP PANEL BY RT-PCR (FLU A&B, COVID) ARPGX2
Influenza A by PCR: NEGATIVE
Influenza B by PCR: NEGATIVE
SARS Coronavirus 2 by RT PCR: NEGATIVE

## 2020-05-03 LAB — BASIC METABOLIC PANEL
Anion gap: 9 (ref 5–15)
BUN: 9 mg/dL (ref 8–23)
CO2: 25 mmol/L (ref 22–32)
Calcium: 9 mg/dL (ref 8.9–10.3)
Chloride: 87 mmol/L — ABNORMAL LOW (ref 98–111)
Creatinine, Ser: 0.82 mg/dL (ref 0.61–1.24)
GFR, Estimated: >60 mL/min (ref >60)
Glucose, Bld: 108 mg/dL — ABNORMAL HIGH (ref 70–99)
Potassium: 4.9 mmol/L (ref 3.5–5.1)
Sodium: 121 mmol/L — ABNORMAL LOW (ref 135–145)

## 2020-05-03 MED ORDER — PREDNISONE 20 MG PO TABS: 20.0000 mg | ORAL_TABLET | Freq: Two times a day (BID) | ORAL | 0 refills | Status: DC

## 2020-05-03 MED ORDER — MAGNESIUM SULFATE 2 GM/50ML IV SOLN
2.0000 g | Freq: Once | INTRAVENOUS | Status: AC
  Administered 2020-05-03: 2 g via INTRAVENOUS
  Filled 2020-05-03: qty 50

## 2020-05-03 NOTE — ED Provider Notes (Signed)
[facility] EMERGENCY DEPARTMENT Provider Note   CSN: [id number] Arrival date & time: 05/03/20  1931     History Chief Complaint  Patient presents with  . Shortness of Breath    Jesus Walker is a 59 y.o. male.  HPI  He presents by EMS for evaluation of shortness of breath.  He is an ongoing smoker.  He was here, and admitted for same, 04/24/2020.  EMS found him to be hypoxic "in the 60s," and transferred him here, giving him a DuoNeb, and IV Solu-Medrol 125 mg.  Patient denies fever, cough, weakness or dizziness.  He is not having chest pain.  He does not use oxygen at home.  There are no other known modifying factors.     Past Medical History:  Diagnosis Date  . Alcohol dependence ([place])   . Asthma   . COPD (chronic obstructive pulmonary disease) ([place])   . GERD (gastroesophageal reflux disease)   . Hypertension     Patient Active Problem List   Diagnosis Date Noted  . Hypomagnesemia   . Alcohol abuse   . Benign essential HTN   . Chest tightness 05/27/2018  . Hypochloremia 05/27/2018  . Serum total bilirubin elevated 05/27/2018  . Tobacco abuse 05/27/2018  . HTN (hypertension) 05/27/2018  . GERD (gastroesophageal reflux disease) 05/27/2018  . COPD exacerbation ([place]) 05/26/2018  . Acute respiratory failure with hypoxia ([place]) 05/26/2018  . Malnutrition of moderate degree 05/23/2016  . Hyponatremia 05/21/2016  . Hypokalemia 05/21/2016  . Mild malnutrition ([place]) 05/21/2016  . Thrombocytopenia ([place]) 05/21/2016  . Seizure ([place]) 05/21/2016  . Alcohol dependence ([place]) 05/21/2016  . Tobacco dependence 05/21/2016  . Precordial chest pain 12/26/2015  . 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 CAP (community acquired pneumonia) 12/26/2015    Past Surgical History:  Procedure Laterality Date  . BRONCHIAL BRUSHINGS  01/03/2016   Procedure: BRONCHIAL BRUSHINGS;  Surgeon: Sinda Du, MD;  Location: [facility] ENDO SUITE;  Service: Cardiopulmonary;;  . BRONCHIAL WASHINGS  01/03/2016   Procedure:  BRONCHIAL WASHINGS;  Surgeon: Sinda Du, MD;  Location: [facility] ENDO SUITE;  Service: Cardiopulmonary;;  . FLEXIBLE BRONCHOSCOPY N/A 01/03/2016   Procedure: FLEXIBLE BRONCHOSCOPY;  Surgeon: Sinda Du, MD;  Location: [facility] ENDO SUITE;  Service: Cardiopulmonary;  Laterality: N/A;       Family History  Problem Relation Age of Onset  . Asthma Mother 64  . Asthma Father 62    Social History   Tobacco Use  . Smoking status: Current Every Day Smoker    Packs/day: 1.00    Types: Cigarettes  . Smokeless tobacco: Never Used  Vaping Use  . Vaping Use: Never used  Substance Use Topics  . Alcohol use: Yes    Alcohol/week: 12.0 standard drinks    Types: 12 Cans of beer per week    Comment: drinks 12 pack + 1/5 wine daily  . Drug use: No    Home Medications Prior to Admission medications   Medication Sig Start Date End Date Taking? Authorizing Provider  acetaminophen (TYLENOL) 500 MG tablet Take 500 mg by mouth every 6 (six) hours as needed.   Yes [provider]  amLODipine (NORVASC) 5 MG tablet Take 5 mg by mouth daily.   Yes [provider]  budesonide-formoterol (SYMBICORT) 160-4.5 MCG/ACT inhaler Inhale 2 puffs into the lungs 2 (two) times daily. 06/14/18  Yes Barton Dubois, MD  folic acid (FOLVITE) 1 MG tablet Take 1 mg by mouth daily. 05/01/20  Yes [provider]  meloxicam ([place])  7.5 MG tablet Take 7.5 mg by mouth daily. 05/01/20  Yes [provider]  omeprazole (PRILOSEC) 20 MG capsule Take 20 mg by mouth daily.   Yes [provider]  PROAIR HFA 108 (90 Base) MCG/ACT inhaler Inhale 2 puffs into the lungs 4 (four) times daily. 12/26/19  Yes [provider]  thiamine 100 MG tablet Take 1 tablet (100 mg total) by mouth daily. 04/28/20  Yes Lama, Marge Duncans, MD  nicotine (NICODERM CQ - DOSED IN MG/24 HOURS) 21 mg/24hr patch Place 1 patch (21 mg total) onto the skin daily. Patient not taking: Reported on 05/03/2020 06/15/18   Barton Dubois, MD  predniSONE (DELTASONE) 20 MG tablet Take 1 tablet (20 mg total) by mouth 2 (two) times daily. 05/03/20   Daleen Bo, MD    Allergies    Patient has no known allergies.  Review of Systems   Review of Systems  All other systems reviewed and are negative.   Physical Exam Updated Vital Signs BP (!) 170/93 (BP Location: Right Arm)   Pulse (!) 105   Temp (!) 97.5 F (36.4 C) (Oral)   Resp 18   SpO2 96%   Physical Exam Vitals and nursing note reviewed.  Constitutional:      General: He is not in acute distress.    Appearance: He is well-developed. He is ill-appearing. He is not toxic-appearing or diaphoretic.     Comments: Chronically ill-appearing, frail  HENT:     Head: Normocephalic and atraumatic.     Right Ear: External ear normal.     Left Ear: External ear normal.  Eyes:     Conjunctiva/sclera: Conjunctivae normal.     Pupils: Pupils are equal, round, and reactive to light.  Neck:     Trachea: Phonation normal.  Cardiovascular:     Rate and Rhythm: Normal rate and regular rhythm.     Heart sounds: Normal heart sounds.  Pulmonary:     Effort: Pulmonary effort is normal.     Comments: Poor air movement bilaterally with generalized wheezing.  No rhonchi or rails.  No increased work of breathing. Abdominal:     General: There is no distension.     Palpations: Abdomen is soft.     Tenderness: There is no abdominal tenderness. There is no guarding.  Musculoskeletal:        General: Normal range of motion.     Cervical back: Normal range of motion and neck supple.     Right lower leg: No edema.     Left lower leg: No edema.  Skin:    General: Skin is warm and dry.     Coloration: Skin is not jaundiced or pale.  Neurological:     Mental Status: He is alert and oriented to person, place, and time.     Cranial Nerves: No cranial nerve deficit.     Sensory: No sensory deficit.     Motor: No abnormal muscle tone.     Coordination: Coordination normal.    Psychiatric:        Mood and Affect: Mood normal.        Behavior: Behavior normal.        Thought Content: Thought content normal.        Judgment: Judgment normal.     ED Results / Procedures / Treatments   Labs (all labs ordered are listed, but only abnormal results are displayed) Labs Reviewed  BASIC METABOLIC PANEL - Abnormal; Notable for the following  components:      Result Value   Sodium 121 (*)    Chloride 87 (*)    Glucose, Bld 108 (*)    All other components within normal limits  CBC WITH DIFFERENTIAL/PLATELET - Abnormal; Notable for the following components:   RBC 3.92 (*)    MCV 109.2 (*)    MCH 35.7 (*)    Lymphs Abs 0.6 (*)    All other components within normal limits  RESP PANEL BY RT-PCR (FLU A&B, COVID) ARPGX2    EKG None    Date: 05/03/20  Rate: 97  Rhythm: normal sinus rhythm  QRS Axis: normal  PR and QT Intervals: normal  ST/T Wave abnormalities: ST elevations anteriorly  PR and QRS Conduction Disutrbances:none  Narrative Interpretation:   Old EKG Reviewed: unchanged since 04/24/20   Radiology DG Chest Portable 1 View  Result Date: 05/03/2020 CLINICAL DATA:  Shortness of breath EXAM: PORTABLE CHEST 1 VIEW COMPARISON:  04/24/2020 FINDINGS: Cardiac shadow is stable. Elevation of the right hemidiaphragm is again seen and stable. No focal infiltrate or sizable effusion is seen. No pneumothorax is noted. No bony abnormality is seen. IMPRESSION: Acute abnormality noted. Electronically Signed   By: Inez Catalina M.D.   On: 05/03/2020 20:13    Procedures Procedures (including critical care time)  Medications Ordered in ED Medications  magnesium sulfate IVPB 2 g 50 mL (0 g Intravenous Stopped 05/03/20 2227)    ED Course  I have reviewed the triage vital signs and the nursing notes.  Pertinent labs & imaging results that were available during my care of the patient were reviewed by me and considered in my medical decision making (see chart for  details).    MDM Rules/Calculators/A&P                           Patient Vitals for the past 24 hrs:  BP Temp Temp src Pulse Resp SpO2  05/03/20 2300 (!) 170/93 -- -- (!) 105 18 96 %  05/03/20 2230 138/80 -- -- 100 17 96 %  05/03/20 2227 140/63 -- -- (!) 102 18 94 %  05/03/20 2100 (!) 162/77 -- -- (!) 101 20 95 %  05/03/20 2030 (!) 143/74 -- -- 93 16 98 %  05/03/20 2000 (!) 148/68 -- -- (!) 102 (!) 23 99 %  05/03/20 1948 -- -- -- (!) 109 -- 95 %  05/03/20 1947 -- -- -- (!) 109 -- 95 %  05/03/20 1946 -- -- -- (!) 110 -- 96 %  05/03/20 1943 -- (!) 97.5 F (36.4 C) Oral -- (!) 24 --  05/03/20 1941 -- -- -- (!) 109 -- 91 %  05/03/20 1940 (!) 186/100 -- -- (!) 107 -- (!) 84 %  05/03/20 1939 -- -- -- (!) 115 -- (!) 77 %  05/03/20 1935 -- -- -- (!) 112 -- 92 %    10:50 PM Reevaluation with update and discussion. After initial assessment and treatment, an updated evaluation reveals he remains with normal and stable oxygen saturation, on room air oxygen, after nasal cannula oxygen removed at 8:50 PM, by me.  No additional complaints at this time. Daleen Bo   Medical Decision Making:  This patient is presenting for evaluation of shortness of breath, which does require a range of treatment options, and is a complaint that involves a high risk of morbidity and mortality. The differential diagnoses include pneumonia, heart failure, acute infection. I decided  to review old records, and in summary middle-aged male, heavy user of alcohol and tobacco products, presenting with shortness of breath.  He was recently hospitalized for COPD exacerbation and also treated for significant hyponatremia at that time.  He is felt to have beer [place] mania..  I did not require additional historical information from anyone.  Clinical Laboratory Tests Ordered, included CBC, Metabolic panel and Covid test. Review indicates normal except sodium low, chloride low, glucose high, MCV high. Radiologic Tests Ordered,  included chest x-ray.  I independently Visualized: Radiographic images, which show no acute abnormality  Cardiac Monitor Tracing which shows normal sinus rhythm    Critical Interventions-clinical evaluation, laboratory testing, chest x-ray, magnesium sulfate given, observation and reassessment  After These Interventions, the Patient was reevaluated and was found with incidental hyponatremia which is recurrent.  Sodium somewhat lower than when discharged, 1 week ago.  At that time it was 125, now is 121.  Patient is motivated to stop drinking.  He understands that is important.  He has a follow-up with his PCP on 05/22/2020.  I offered the patient to stay in the hospital for further treatment.  He prefers to try to go home.  He understands he will need to pick up a prescription for prednisone and his pharmacy  CRITICAL CARE-no Performed by: Daleen Bo  Nursing Notes Reviewed/ Care Coordinated Applicable Imaging Reviewed Interpretation of Laboratory Data incorporated into ED treatment  The patient appears reasonably screened and/or stabilized for discharge and I doubt any other medical condition or other [facility] requiring further screening, evaluation, or treatment in the ED at this time prior to discharge.  Plan: Home Medications-continue current medication; Home Treatments-avoid all forms of alcohol and tobacco; return here if the recommended treatment, does not improve the symptoms; Recommended follow up-PCP follow-up as scheduled.  Return here as needed.     Final Clinical Impression(s) / ED Diagnoses Final diagnoses:  COPD exacerbation ([place])  Hyponatremia    Rx / DC Orders ED Discharge Orders         Ordered    predniSONE (DELTASONE) 20 MG tablet  2 times daily        05/03/20 2300           Daleen Bo, MD 05/03/20 2316

## 2020-05-03 NOTE — ED Notes (Signed)
Pt contacted sister, Onalee Hua, and is arranging transportation home.

## 2020-05-03 NOTE — ED Triage Notes (Addendum)
Pt with c/o SOB, here for same last week.  Pt with hx of COPD.  Pt c/o sob since this evening. RA sats 61% on RA on arrival.

## 2020-05-03 NOTE — ED Notes (Signed)
Pt received duoneb en route and 125 mg Solumedrol.

## 2020-05-03 NOTE — Discharge Instructions (Addendum)
It is very important that you avoid all forms of alcohol, and stop smoking.  Your sodium is very low today and will continue to be low if you continue to drink alcohol.  Your COPD will not get better if you continue to smoke.  We sent a prescription for prednisone to your pharmacy to help your COPD.  Follow-up with your primary care doctor as scheduled later this month.  Try to drink plenty of water, and 3 good healthy meals every day.  Return here, if needed, for problems.

## 2020-05-03 NOTE — ED Notes (Signed)
Pt recently here for COPD excerebration.  Pt 92% on RA per EMS and received duoneb en route.  RA sats were 61% on arrival.  Pt placed on 2 L/M O2 [place] and sats increased to 95%.

## 2020-05-03 NOTE — ED Notes (Signed)
O2 turned off by Dr. Eulis Foster to monitor if patient's O2 drops. Pt currently at 95% on room air. Will continue to monitor.

## 2020-05-20 ENCOUNTER — Emergency Department (HOSPITAL_COMMUNITY)
Admission: EM | Admit: 2020-05-20 | Discharge: 2020-05-21 | Disposition: A | Discharge status: Home / Self Care | Payer: Medicaid Other | Attending: Emergency Medicine | Admitting: Emergency Medicine

## 2020-05-20 ENCOUNTER — Other Ambulatory Visit: Payer: Self-pay

## 2020-05-20 ENCOUNTER — Emergency Department (HOSPITAL_COMMUNITY): Payer: Medicaid Other

## 2020-05-20 ENCOUNTER — Encounter (HOSPITAL_COMMUNITY): Payer: Self-pay | Admitting: Emergency Medicine

## 2020-05-20 DIAGNOSIS — F17210 Nicotine dependence, cigarettes, uncomplicated: Secondary | ICD-10-CM | POA: Insufficient documentation

## 2020-05-20 DIAGNOSIS — Z79899 Other long term (current) drug therapy: Secondary | ICD-10-CM | POA: Diagnosis not present

## 2020-05-20 DIAGNOSIS — J441 Chronic obstructive pulmonary disease with (acute) exacerbation: Secondary | ICD-10-CM | POA: Diagnosis not present

## 2020-05-20 DIAGNOSIS — Z20822 Contact with and (suspected) exposure to covid-19: Secondary | ICD-10-CM | POA: Diagnosis not present

## 2020-05-20 DIAGNOSIS — I10 Essential (primary) hypertension: Secondary | ICD-10-CM | POA: Diagnosis not present

## 2020-05-20 DIAGNOSIS — R0602 Shortness of breath: Secondary | ICD-10-CM | POA: Diagnosis present

## 2020-05-20 LAB — RESP PANEL BY RT-PCR (FLU A&B, COVID) ARPGX2
Influenza A by PCR: NEGATIVE
Influenza B by PCR: NEGATIVE
SARS Coronavirus 2 by RT PCR: NEGATIVE

## 2020-05-20 MED ORDER — PREDNISONE 10 MG PO TABS: 20.0000 mg | ORAL_TABLET | Freq: Every day | ORAL | 0 refills | Status: DC

## 2020-05-20 MED ORDER — METHYLPREDNISOLONE SODIUM SUCC 125 MG IJ SOLR
125.0000 mg | Freq: Once | INTRAMUSCULAR | Status: AC
  Administered 2020-05-20: 21:00:00 125 mg via INTRAVENOUS
  Filled 2020-05-20: qty 2

## 2020-05-20 MED ORDER — ALBUTEROL SULFATE HFA 108 (90 BASE) MCG/ACT IN AERS
2.0000 | INHALATION_SPRAY | Freq: Once | RESPIRATORY_TRACT | Status: AC
  Administered 2020-05-20: 21:00:00 2 via RESPIRATORY_TRACT
  Filled 2020-05-20: qty 6.7

## 2020-05-20 MED ORDER — MAGNESIUM SULFATE 2 GM/50ML IV SOLN
2.0000 g | Freq: Once | INTRAVENOUS | Status: AC
  Administered 2020-05-20: 21:00:00 2 g via INTRAVENOUS
  Filled 2020-05-20: qty 50

## 2020-05-20 NOTE — ED Provider Notes (Signed)
[facility] EMERGENCY DEPARTMENT Provider Note   CSN: [id number] Arrival date & time: 05/20/20  2011     History Chief Complaint  Patient presents with   Shortness of Breath    Jesus Walker is a 59 y.o. male.  Patient complains of shortness of breath and wheezing.  Patient has a history of COPD.  No fever no chills  The history is provided by the patient and medical records. No language interpreter was used.  Shortness of Breath Severity:  Moderate Onset quality:  Sudden Timing:  Constant Progression:  Waxing and waning Chronicity:  Recurrent Context: not animal exposure   Relieved by:  Nothing Worsened by:  Nothing Ineffective treatments:  None tried Associated symptoms: no abdominal pain, no chest pain, no cough, no headaches and no rash        Past Medical History:  Diagnosis Date   Alcohol dependence ([place])    Asthma    COPD (chronic obstructive pulmonary disease) ([place])    GERD (gastroesophageal reflux disease)    Hypertension     Patient Active Problem List   Diagnosis Date Noted   Hypomagnesemia    Alcohol abuse    Benign essential HTN    Chest tightness 05/27/2018   Hypochloremia 05/27/2018   Serum total bilirubin elevated 05/27/2018   Tobacco abuse 05/27/2018   HTN (hypertension) 05/27/2018   GERD (gastroesophageal reflux disease) 05/27/2018   COPD exacerbation ([place]) 05/26/2018   Acute respiratory failure with hypoxia ([place]) 05/26/2018   Malnutrition of moderate degree 05/23/2016   Hyponatremia 05/21/2016   Hypokalemia 05/21/2016   Mild malnutrition ([place]) 05/21/2016   Thrombocytopenia ([place]) 05/21/2016   Seizure ([place]) 05/21/2016   Alcohol dependence ([place]) 05/21/2016   Tobacco dependence 05/21/2016   Precordial chest pain 12/26/2015   Lung mass 12/26/2015   CAP (community acquired pneumonia) 12/26/2015    Past Surgical History:  Procedure Laterality Date   BRONCHIAL BRUSHINGS  01/03/2016   Procedure: BRONCHIAL  BRUSHINGS;  Surgeon: Sinda Du, MD;  Location: [facility];  Service: Cardiopulmonary;;   BRONCHIAL WASHINGS  01/03/2016   Procedure: BRONCHIAL WASHINGS;  Surgeon: Sinda Du, MD;  Location: [facility];  Service: Cardiopulmonary;;   FLEXIBLE BRONCHOSCOPY N/A 01/03/2016   Procedure: FLEXIBLE BRONCHOSCOPY;  Surgeon: Sinda Du, MD;  Location: [facility];  Service: Cardiopulmonary;  Laterality: N/A;       Family History  Problem Relation Age of Onset   Asthma Mother 47   Asthma Father 63    Social History   Tobacco Use   Smoking status: Current Every Day Smoker    Packs/day: 1.00    Types: Cigarettes   Smokeless tobacco: Never Used  Vaping Use   Vaping Use: Never used  Substance Use Topics   Alcohol use: Yes    Alcohol/week: 12.0 standard drinks    Types: 12 Cans of beer per week    Comment: drinks 12 pack + 1/5 wine daily   Drug use: No    Home Medications Prior to Admission medications   Medication Sig Start Date End Date Taking? Authorizing Provider  acetaminophen (TYLENOL) 500 MG tablet Take 500 mg by mouth every 6 (six) hours as needed.   Yes [provider]  amLODipine (NORVASC) 5 MG tablet Take 5 mg by mouth daily.   Yes [provider]  budesonide-formoterol (SYMBICORT) 160-4.5 MCG/ACT inhaler Inhale 2 puffs into the lungs 2 (two) times daily. 06/14/18  Yes Barton Dubois, MD  naproxen sodium (ALEVE) 220 MG tablet Take  220 mg by mouth.   Yes [provider]  omeprazole (PRILOSEC) 20 MG capsule Take 20 mg by mouth daily.   Yes [provider]  PROAIR HFA 108 (90 Base) MCG/ACT inhaler Inhale 2 puffs into the lungs 4 (four) times daily. 12/26/19  Yes [provider]  thiamine 100 MG tablet Take 1 tablet (100 mg total) by mouth daily. 04/28/20  Yes Oswald Hillock, MD  folic acid (FOLVITE) 1 MG tablet Take 1 mg by mouth daily. 05/01/20   [provider]  nicotine (NICODERM CQ - DOSED IN MG/24  HOURS) 21 mg/24hr patch Place 1 patch (21 mg total) onto the skin daily. Patient not taking: No sig reported 06/15/18   Barton Dubois, MD  predniSONE (DELTASONE) 10 MG tablet Take 2 tablets (20 mg total) by mouth daily. 05/20/20   Milton Ferguson, MD    Allergies    Patient has no known allergies.  Review of Systems   Review of Systems  Constitutional: Negative for appetite change and fatigue.  HENT: Negative for congestion, ear discharge and sinus pressure.   Eyes: Negative for discharge.  Respiratory: Positive for shortness of breath. Negative for cough.   Cardiovascular: Negative for chest pain.  Gastrointestinal: Negative for abdominal pain and diarrhea.  Genitourinary: Negative for frequency and hematuria.  Musculoskeletal: Negative for back pain.  Skin: Negative for rash.  Neurological: Negative for seizures and headaches.  Psychiatric/Behavioral: Negative for hallucinations.    Physical Exam Updated Vital Signs BP (!) 148/72    Pulse (!) 103    Temp 97.8 F (36.6 C)    Resp 17    Ht 5\' 2"  (1.575 m)    Wt 53.1 kg    SpO2 96%    BMI 21.41 kg/m   Physical Exam Vitals and nursing note reviewed.  Constitutional:      Appearance: He is well-developed.  HENT:     Head: Normocephalic.     Nose: Nose normal.  Eyes:     General: No scleral icterus.    Extraocular Movements: EOM normal.     Conjunctiva/sclera: Conjunctivae normal.  Neck:     Thyroid: No thyromegaly.  Cardiovascular:     Rate and Rhythm: Normal rate and regular rhythm.     Heart sounds: No murmur heard. No friction rub. No gallop.   Pulmonary:     Breath sounds: No stridor. Wheezing present. No rales.  Chest:     Chest wall: No tenderness.  Abdominal:     General: There is no distension.     Tenderness: There is no abdominal tenderness. There is no rebound.  Musculoskeletal:        General: No edema. Normal range of motion.     Cervical back: Neck supple.  Lymphadenopathy:     Cervical: No cervical  adenopathy.  Skin:    Findings: No erythema or rash.  Neurological:     Mental Status: He is alert and oriented to person, place, and time.     Motor: No abnormal muscle tone.     Coordination: Coordination normal.  Psychiatric:        Mood and Affect: Mood and affect normal.        Behavior: Behavior normal.     ED Results / Procedures / Treatments   Labs (all labs ordered are listed, but only abnormal results are displayed) Labs Reviewed  RESP PANEL BY RT-PCR (FLU A&B, COVID) ARPGX2  CBC WITH DIFFERENTIAL/PLATELET  COMPREHENSIVE METABOLIC PANEL  EKG None  Radiology DG Chest Port 1 View  Result Date: 05/20/2020 CLINICAL DATA:  Short of breath, bilateral wheezing EXAM: PORTABLE CHEST 1 VIEW COMPARISON:  05/03/2020 FINDINGS: Single frontal view of the chest demonstrates a stable cardiac silhouette. Chronic elevation of the right hemidiaphragm. Stable background scarring and emphysema without airspace disease, effusion, or pneumothorax. No acute bony abnormalities. IMPRESSION: 1. Stable exam, no acute process. Electronically Signed   By: Randa Ngo M.D.   On: 05/20/2020 21:24    Procedures Procedures (including critical care time)  Medications Ordered in ED Medications  albuterol (VENTOLIN HFA) 108 (90 Base) MCG/ACT inhaler 2 puff (2 puffs Inhalation Given 05/20/20 2119)  magnesium sulfate IVPB 2 g 50 mL (0 g Intravenous Stopped 05/20/20 2258)  methylPREDNISolone sodium succinate (SOLU-MEDROL) 125 mg/2 mL injection 125 mg (125 mg Intravenous Given 05/20/20 2121)    ED Course  I have reviewed the triage vital signs and the nursing notes.  Pertinent labs & imaging results that were available during my care of the patient were reviewed by me and considered in my medical decision making (see chart for details).    MDM Rules/Calculators/A&P                         Patient with COPD exacerbation.  Patient improved with steroids magnesium and neb treatments.  He will  follow-up with his primary care doctor this week and is given a prescription of prednisone Final Clinical Impression(s) / ED Diagnoses Final diagnoses:  COPD exacerbation ([place])    Rx / DC Orders ED Discharge Orders         Ordered    predniSONE (DELTASONE) 10 MG tablet  Daily        05/20/20 2323           Milton Ferguson, MD 05/22/20 1159

## 2020-05-20 NOTE — Discharge Instructions (Addendum)
Use your inhaler as prescribed.  Follow-up with your doctor this week as planned.  Return here if any problems

## 2020-05-20 NOTE — ED Notes (Addendum)
Ambulated pt. 100 ft. Pts. O2 stayed between 89-92 on RA. Pt. Denied any SHOB.

## 2020-05-20 NOTE — ED Triage Notes (Signed)
Pt. Came in with complaints of SHOB via EMS. EMS noted wheezing bilaterally. EMS administered 5 of albuterol nebulizer and 125 of solumedrol in route to hospital. Pt. Was on 4 L of O2 in route. Pt is on room air and their O2 is 91 here at hospital.

## 2020-05-21 LAB — CBC WITH DIFFERENTIAL/PLATELET
Abs Immature Granulocytes: 0.04 10*3/uL (ref 0.00–0.07)
Basophils Absolute: 0 10*3/uL (ref 0.0–0.1)
Basophils Relative: 0 %
Eosinophils Absolute: 0 10*3/uL (ref 0.0–0.5)
Eosinophils Relative: 0 %
HCT: 41.5 % (ref 39.0–52.0)
Hemoglobin: 14.6 g/dL (ref 13.0–17.0)
Immature Granulocytes: 0 %
Lymphocytes Relative: 6 %
Lymphs Abs: 0.5 10*3/uL — ABNORMAL LOW (ref 0.7–4.0)
MCH: 35.1 pg — ABNORMAL HIGH (ref 26.0–34.0)
MCHC: 35.2 g/dL (ref 30.0–36.0)
MCV: 99.8 fL (ref 80.0–100.0)
Monocytes Absolute: 0.1 10*3/uL (ref 0.1–1.0)
Monocytes Relative: 1 %
Neutro Abs: 8.2 10*3/uL — ABNORMAL HIGH (ref 1.7–7.7)
Neutrophils Relative %: 93 %
Platelets: 241 10*3/uL (ref 150–400)
RBC: 4.16 MIL/uL — ABNORMAL LOW (ref 4.22–5.81)
RDW: 11.7 % (ref 11.5–15.5)
WBC: 8.9 10*3/uL (ref 4.0–10.5)
nRBC: 0 % (ref 0.0–0.2)

## 2020-05-21 LAB — COMPREHENSIVE METABOLIC PANEL
ALT: 19 U/L (ref 0–44)
AST: 25 U/L (ref 15–41)
Albumin: 4.1 g/dL (ref 3.5–5.0)
Alkaline Phosphatase: 118 U/L (ref 38–126)
Anion gap: 12 (ref 5–15)
BUN: 8 mg/dL (ref 8–23)
CO2: 22 mmol/L (ref 22–32)
Calcium: 9.4 mg/dL (ref 8.9–10.3)
Chloride: 92 mmol/L — ABNORMAL LOW (ref 98–111)
Creatinine, Ser: 0.9 mg/dL (ref 0.61–1.24)
GFR, Estimated: >60 mL/min (ref >60)
Glucose, Bld: 109 mg/dL — ABNORMAL HIGH (ref 70–99)
Potassium: 4.3 mmol/L (ref 3.5–5.1)
Sodium: 126 mmol/L — ABNORMAL LOW (ref 135–145)
Total Bilirubin: 0.7 mg/dL (ref 0.3–1.2)
Total Protein: 7.9 g/dL (ref 6.5–8.1)

## 2020-05-21 NOTE — ED Provider Notes (Signed)
Patient left at change of shift to check labs and that he can be discharged.  Results for orders placed or performed during the hospital encounter of 05/20/20  Resp Panel by RT-PCR (Flu A&B, Covid) Nasopharyngeal Swab   Specimen: Nasopharyngeal Swab; Nasopharyngeal(NP) swabs in vial transport medium  Result Value Ref Range   SARS Coronavirus 2 by RT PCR NEGATIVE NEGATIVE   Influenza A by PCR NEGATIVE NEGATIVE   Influenza B by PCR NEGATIVE NEGATIVE  CBC with Differential/Platelet  Result Value Ref Range   WBC 8.9 4.0 - 10.5 K/uL   RBC 4.16 (L) 4.22 - 5.81 MIL/uL   Hemoglobin 14.6 13.0 - 17.0 g/dL   HCT 41.5 39.0 - 52.0 %   MCV 99.8 80.0 - 100.0 fL   MCH 35.1 (H) 26.0 - 34.0 pg   MCHC 35.2 30.0 - 36.0 g/dL   RDW 11.7 11.5 - 15.5 %   Platelets 241 150 - 400 K/uL   nRBC 0.0 0.0 - 0.2 %   Neutrophils Relative % 93 %   Neutro Abs 8.2 (H) 1.7 - 7.7 K/uL   Lymphocytes Relative 6 %   Lymphs Abs 0.5 (L) 0.7 - 4.0 K/uL   Monocytes Relative 1 %   Monocytes Absolute 0.1 0.1 - 1.0 K/uL   Eosinophils Relative 0 %   Eosinophils Absolute 0.0 0.0 - 0.5 K/uL   Basophils Relative 0 %   Basophils Absolute 0.0 0.0 - 0.1 K/uL   WBC Morphology TOXIC GRANULATION    Immature Granulocytes 0 %   Abs Immature Granulocytes 0.04 0.00 - 0.07 K/uL   Reactive, Benign Lymphocytes PRESENT   Comprehensive metabolic panel  Result Value Ref Range   Sodium 126 (L) 135 - 145 mmol/L   Potassium 4.3 3.5 - 5.1 mmol/L   Chloride 92 (L) 98 - 111 mmol/L   CO2 22 22 - 32 mmol/L   Glucose, Bld 109 (H) 70 - 99 mg/dL   BUN 8 8 - 23 mg/dL   Creatinine, Ser 0.90 0.61 - 1.24 mg/dL   Calcium 9.4 8.9 - 10.3 mg/dL   Total Protein 7.9 6.5 - 8.1 g/dL   Albumin 4.1 3.5 - 5.0 g/dL   AST 25 15 - 41 U/L   ALT 19 0 - 44 U/L   Alkaline Phosphatase 118 38 - 126 U/L   Total Bilirubin 0.7 0.3 - 1.2 mg/dL   GFR, Estimated >60 >60 mL/min   Anion gap 12 5 - 15    Laboratory interpretation all normal except improving  hyponatremia     Rolland Porter, MD 05/21/20 [phone]

## 2020-06-21 ENCOUNTER — Encounter (INDEPENDENT_AMBULATORY_CARE_PROVIDER_SITE_OTHER): Payer: Self-pay | Admitting: *Deleted

## 2021-02-19 IMAGING — DX DG CHEST 1V PORT
1 series · 1 of 1 positions shown · non-contrast
Comparison: 06/10/2018

CLINICAL DATA: Shortness of breath

EXAM:
PORTABLE CHEST 1 VIEW

[chest ap]
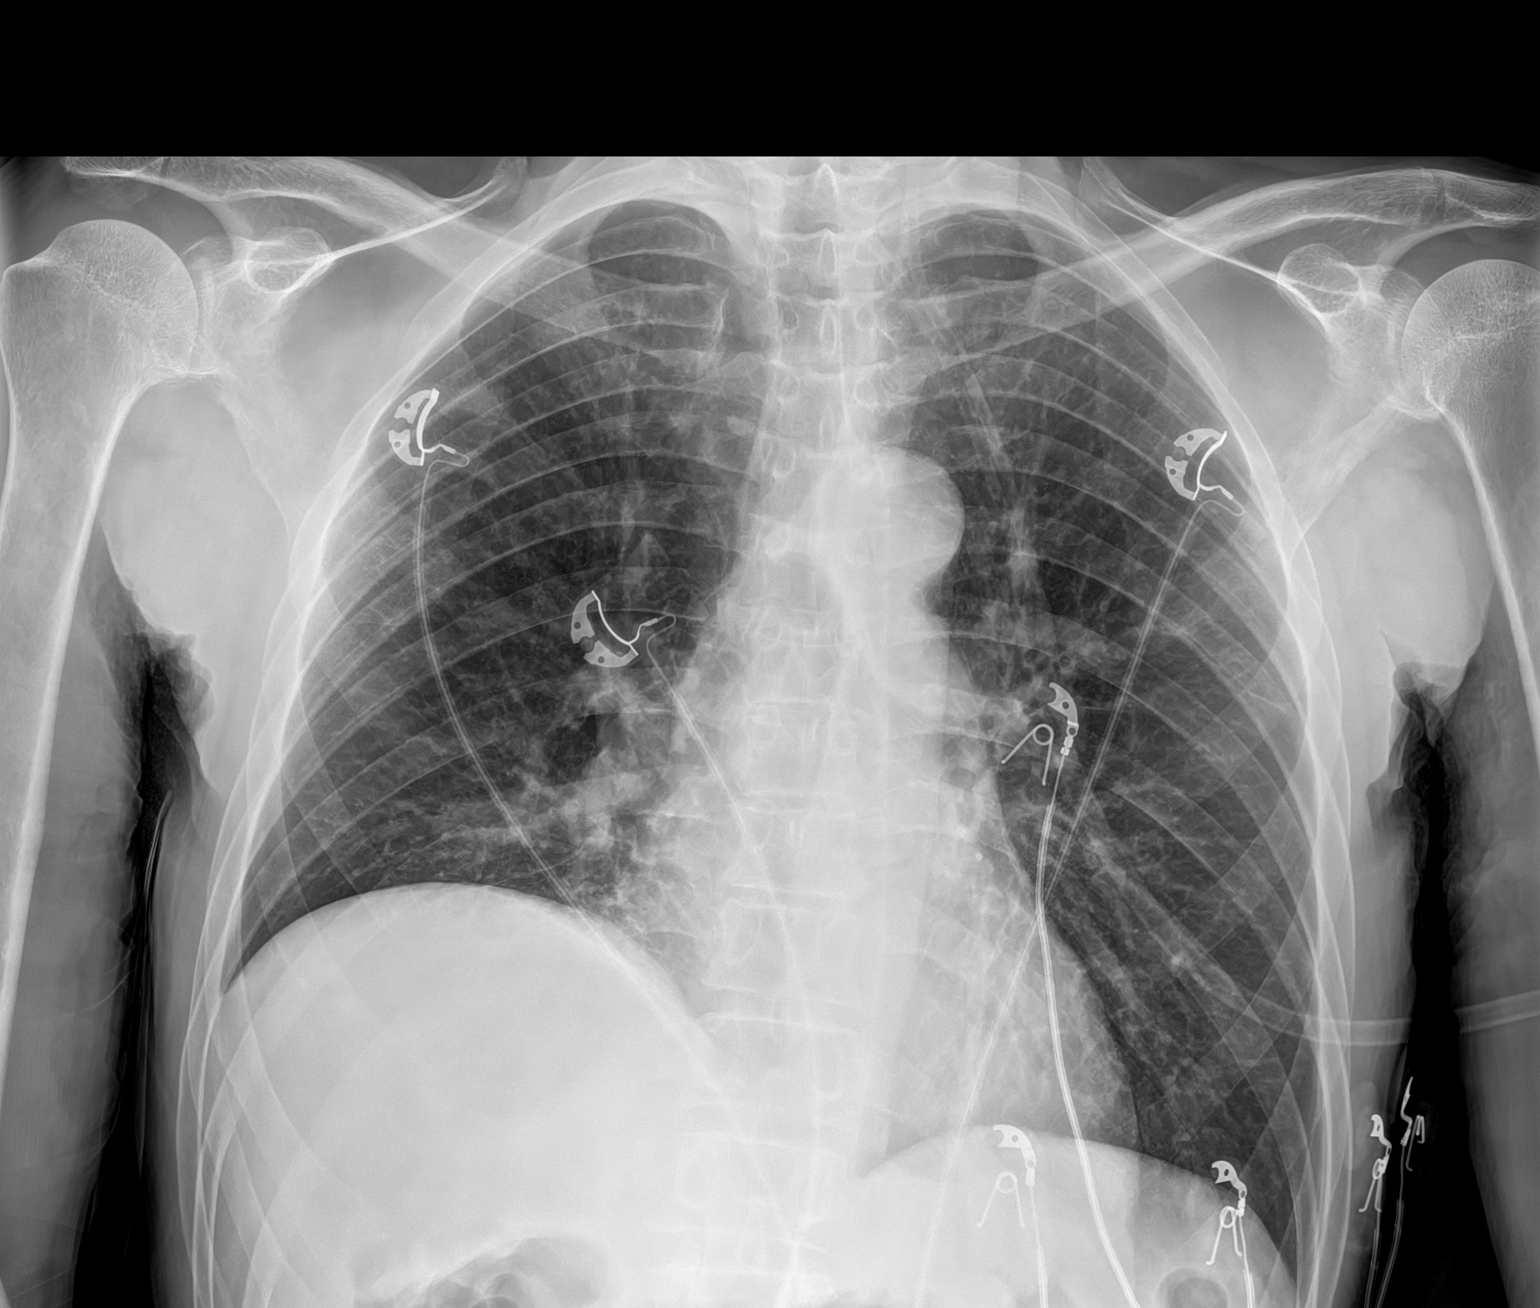

[1 of 1 positions shown; findings below may reference images not displayed]

FINDINGS: The heart size and mediastinal contours are within normal limits.
Both lungs are clear. The visualized skeletal structures are
unremarkable. Chronic elevation of the right hemidiaphragm.
IMPRESSION: No active disease.

## 2021-03-17 IMAGING — DX DG CHEST 1V PORT
1 series · 1 of 1 positions shown · non-contrast
Comparison: 05/03/2020

CLINICAL DATA: Short of breath, bilateral wheezing

EXAM:
PORTABLE CHEST 1 VIEW

[chest ap]
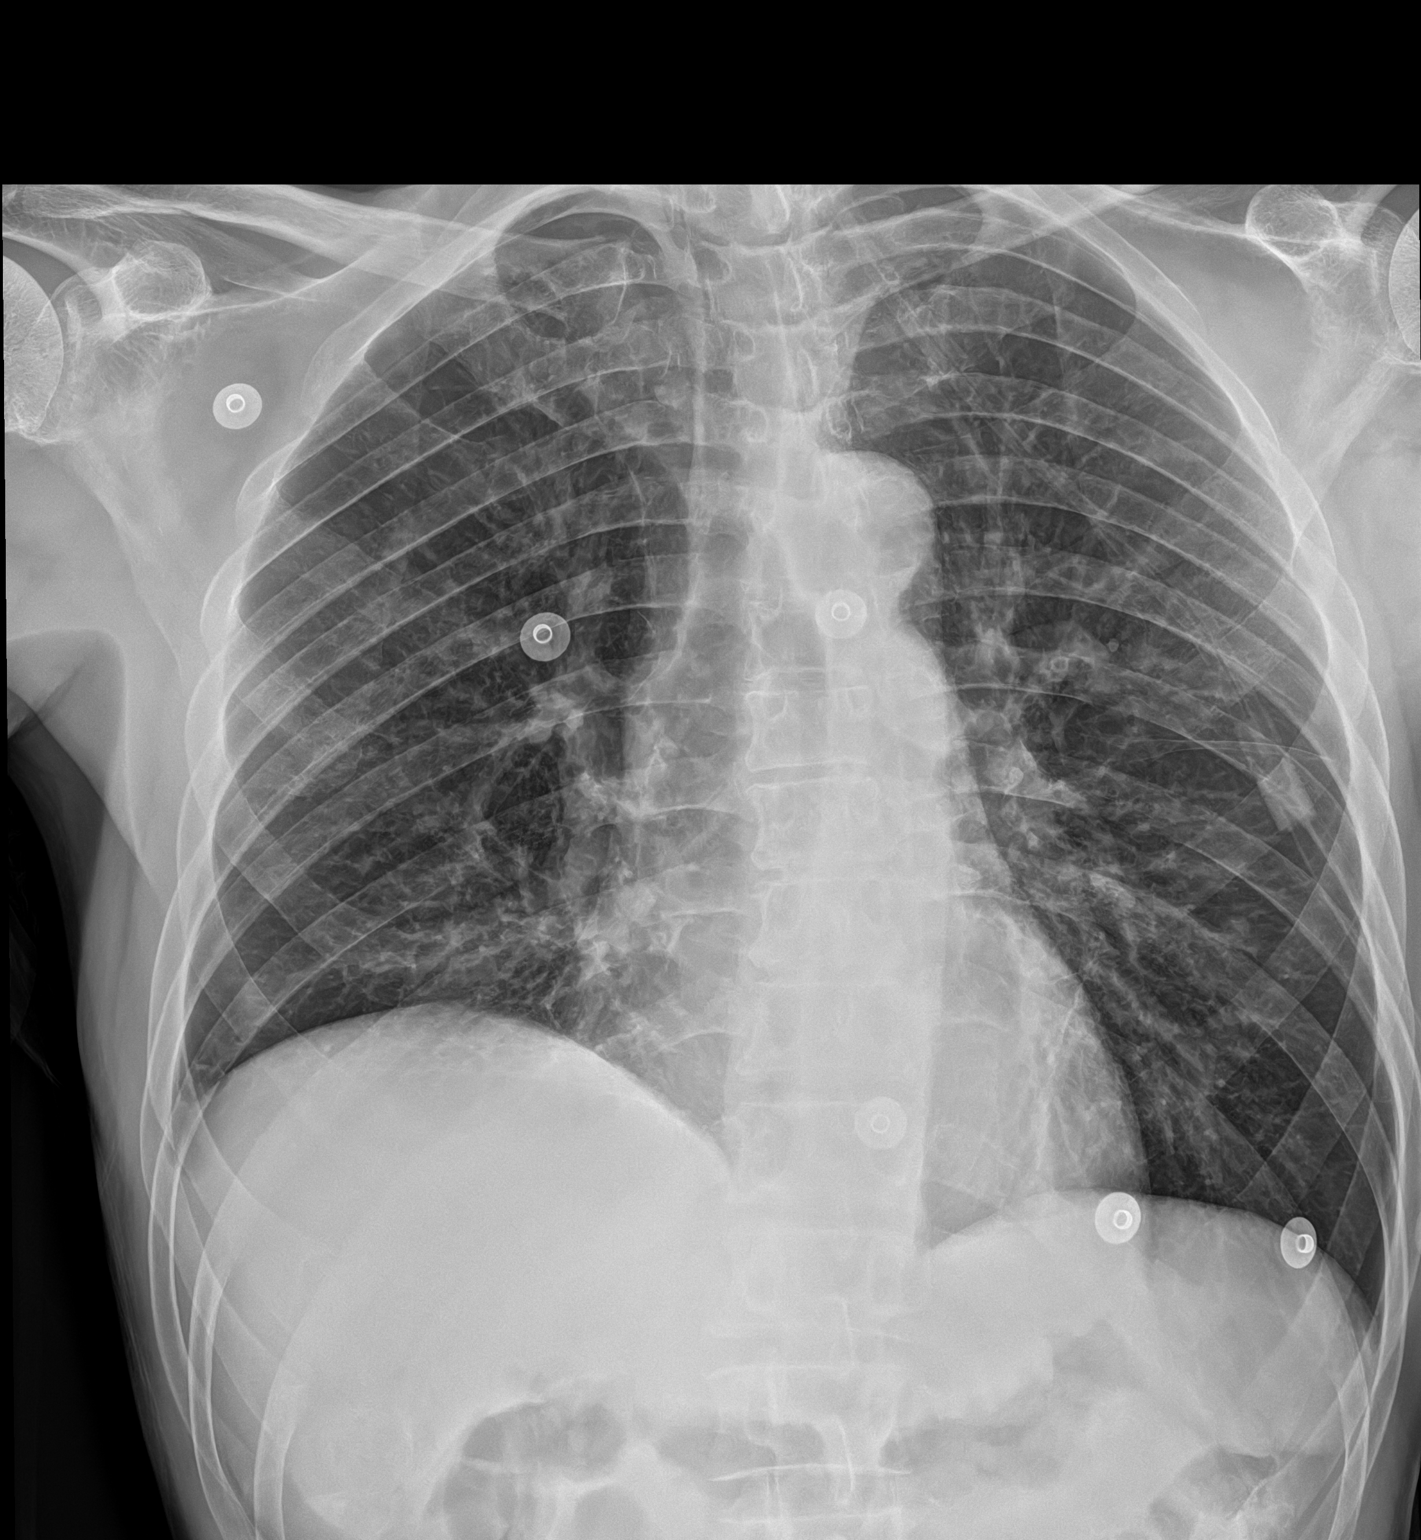

[1 of 1 positions shown; findings below may reference images not displayed]

FINDINGS: Single frontal view of the chest demonstrates a stable cardiac
silhouette. Chronic elevation of the right hemidiaphragm. Stable
background scarring and emphysema without airspace disease,
effusion, or pneumothorax. No acute bony abnormalities.
IMPRESSION: 1. Stable exam, no acute process.

## 2021-06-02 ENCOUNTER — Emergency Department (HOSPITAL_COMMUNITY): Payer: Medicaid Other

## 2021-06-02 ENCOUNTER — Other Ambulatory Visit: Payer: Self-pay

## 2021-06-02 ENCOUNTER — Encounter (HOSPITAL_COMMUNITY): Payer: Self-pay

## 2021-06-02 ENCOUNTER — Inpatient Hospital Stay (HOSPITAL_COMMUNITY)
Admission: EM | Admit: 2021-06-02 | Discharge: 2021-06-04 | DRG: 190 | Disposition: A | Discharge status: Home / Self Care | Payer: Medicaid Other | Attending: Internal Medicine | Admitting: Internal Medicine

## 2021-06-02 DIAGNOSIS — J9601 Acute respiratory failure with hypoxia: Secondary | ICD-10-CM | POA: Diagnosis present

## 2021-06-02 DIAGNOSIS — E871 Hypo-osmolality and hyponatremia: Secondary | ICD-10-CM | POA: Diagnosis present

## 2021-06-02 DIAGNOSIS — Z20822 Contact with and (suspected) exposure to covid-19: Secondary | ICD-10-CM | POA: Diagnosis present

## 2021-06-02 DIAGNOSIS — F1020 Alcohol dependence, uncomplicated: Secondary | ICD-10-CM | POA: Diagnosis present

## 2021-06-02 DIAGNOSIS — E869 Volume depletion, unspecified: Secondary | ICD-10-CM | POA: Diagnosis present

## 2021-06-02 DIAGNOSIS — F17200 Nicotine dependence, unspecified, uncomplicated: Secondary | ICD-10-CM | POA: Diagnosis present

## 2021-06-02 DIAGNOSIS — Z7951 Long term (current) use of inhaled steroids: Secondary | ICD-10-CM | POA: Diagnosis not present

## 2021-06-02 DIAGNOSIS — Z79899 Other long term (current) drug therapy: Secondary | ICD-10-CM | POA: Diagnosis not present

## 2021-06-02 DIAGNOSIS — F17210 Nicotine dependence, cigarettes, uncomplicated: Secondary | ICD-10-CM | POA: Diagnosis present

## 2021-06-02 DIAGNOSIS — J441 Chronic obstructive pulmonary disease with (acute) exacerbation: Principal | ICD-10-CM | POA: Diagnosis present

## 2021-06-02 DIAGNOSIS — K219 Gastro-esophageal reflux disease without esophagitis: Secondary | ICD-10-CM | POA: Diagnosis present

## 2021-06-02 DIAGNOSIS — I10 Essential (primary) hypertension: Secondary | ICD-10-CM | POA: Diagnosis present

## 2021-06-02 DIAGNOSIS — Z825 Family history of asthma and other chronic lower respiratory diseases: Secondary | ICD-10-CM | POA: Diagnosis not present

## 2021-06-02 LAB — CBC WITH DIFFERENTIAL/PLATELET
Abs Immature Granulocytes: 0.02 10*3/uL (ref 0.00–0.07)
Basophils Absolute: 0 10*3/uL (ref 0.0–0.1)
Basophils Relative: 1 %
Eosinophils Absolute: 0.4 10*3/uL (ref 0.0–0.5)
Eosinophils Relative: 6 %
HCT: 43.7 % (ref 39.0–52.0)
Hemoglobin: 15.4 g/dL (ref 13.0–17.0)
Immature Granulocytes: 0 %
Lymphocytes Relative: 37 %
Lymphs Abs: 2.1 10*3/uL (ref 0.7–4.0)
MCH: 35.1 pg — ABNORMAL HIGH (ref 26.0–34.0)
MCHC: 35.2 g/dL (ref 30.0–36.0)
MCV: 99.5 fL (ref 80.0–100.0)
Monocytes Absolute: 0.7 10*3/uL (ref 0.1–1.0)
Monocytes Relative: 12 %
Neutro Abs: 2.5 10*3/uL (ref 1.7–7.7)
Neutrophils Relative %: 44 %
Platelets: 234 10*3/uL (ref 150–400)
RBC: 4.39 MIL/uL (ref 4.22–5.81)
RDW: 11.9 % (ref 11.5–15.5)
WBC: 5.8 10*3/uL (ref 4.0–10.5)
nRBC: 0 % (ref 0.0–0.2)

## 2021-06-02 LAB — COMPREHENSIVE METABOLIC PANEL
ALT: 17 U/L (ref 0–44)
AST: 31 U/L (ref 15–41)
Albumin: 4 g/dL (ref 3.5–5.0)
Alkaline Phosphatase: 89 U/L (ref 38–126)
Anion gap: 11 (ref 5–15)
BUN: 8 mg/dL (ref 8–23)
CO2: 24 mmol/L (ref 22–32)
Calcium: 9.1 mg/dL (ref 8.9–10.3)
Chloride: 91 mmol/L — ABNORMAL LOW (ref 98–111)
Creatinine, Ser: 0.78 mg/dL (ref 0.61–1.24)
GFR, Estimated: >60 mL/min (ref >60)
Glucose, Bld: 88 mg/dL (ref 70–99)
Potassium: 3.9 mmol/L (ref 3.5–5.1)
Sodium: 126 mmol/L — ABNORMAL LOW (ref 135–145)
Total Bilirubin: 0.9 mg/dL (ref 0.3–1.2)
Total Protein: 7.5 g/dL (ref 6.5–8.1)

## 2021-06-02 LAB — BLOOD GAS, ARTERIAL
Acid-Base Excess: 1.6 mmol/L (ref 0.0–2.0)
Bicarbonate: 25.6 mmol/L (ref 20.0–28.0)
FIO2: 28
O2 Saturation: 92.5 %
Patient temperature: 37
pCO2 arterial: 41.1 mmHg (ref 32.0–48.0)
pH, Arterial: 7.414 (ref 7.350–7.450)
pO2, Arterial: 69.7 mmHg — ABNORMAL LOW (ref 83.0–108.0)

## 2021-06-02 LAB — BRAIN NATRIURETIC PEPTIDE: B Natriuretic Peptide: 28 pg/mL (ref 0.0–100.0)

## 2021-06-02 LAB — PROTIME-INR
INR: 0.9 (ref 0.8–1.2)
Prothrombin Time: 11.9 seconds (ref 11.4–15.2)

## 2021-06-02 LAB — RESP PANEL BY RT-PCR (FLU A&B, COVID) ARPGX2
Influenza A by PCR: NEGATIVE
Influenza B by PCR: NEGATIVE
SARS Coronavirus 2 by RT PCR: NEGATIVE

## 2021-06-02 LAB — TROPONIN I (HIGH SENSITIVITY): Troponin I (High Sensitivity): 6 ng/L (ref <18)

## 2021-06-02 MED ORDER — METHYLPREDNISOLONE SODIUM SUCC 125 MG IJ SOLR
125.0000 mg | Freq: Once | INTRAMUSCULAR | Status: AC
  Administered 2021-06-02: 125 mg via INTRAVENOUS
  Filled 2021-06-02: qty 2

## 2021-06-02 MED ORDER — MOMETASONE FURO-FORMOTEROL FUM 200-5 MCG/ACT IN AERO
2.0000 | INHALATION_SPRAY | Freq: Two times a day (BID) | RESPIRATORY_TRACT | Status: DC
  Administered 2021-06-02: 2 via RESPIRATORY_TRACT
  Filled 2021-06-02 (×2): qty 8.8

## 2021-06-02 MED ORDER — FOLIC ACID 1 MG PO TABS
1.0000 mg | ORAL_TABLET | Freq: Every day | ORAL | Status: DC
  Administered 2021-06-03 – 2021-06-04 (×2): 1 mg via ORAL
  Filled 2021-06-02 (×2): qty 1

## 2021-06-02 MED ORDER — ACETAMINOPHEN 500 MG PO TABS: 500.0000 mg | ORAL_TABLET | Freq: Four times a day (QID) | ORAL | Status: DC | PRN

## 2021-06-02 MED ORDER — ALBUTEROL SULFATE (2.5 MG/3ML) 0.083% IN NEBU
2.5000 mg | INHALATION_SOLUTION | Freq: Four times a day (QID) | RESPIRATORY_TRACT | Status: DC
  Administered 2021-06-02: 2.5 mg via RESPIRATORY_TRACT
  Filled 2021-06-02: qty 3

## 2021-06-02 MED ORDER — NAPROXEN SODIUM 220 MG PO TABS
220.0000 mg | ORAL_TABLET | Freq: Every day | ORAL | Status: DC
  Filled 2021-06-02: qty 1

## 2021-06-02 MED ORDER — SODIUM CHLORIDE 0.9 % IV SOLN: INTRAVENOUS | Status: DC

## 2021-06-02 MED ORDER — ALBUTEROL (5 MG/ML) CONTINUOUS INHALATION SOLN: 10.0000 mg | INHALATION_SOLUTION | RESPIRATORY_TRACT | Status: AC

## 2021-06-02 MED ORDER — METHYLPREDNISOLONE SODIUM SUCC 40 MG IJ SOLR
40.0000 mg | Freq: Four times a day (QID) | INTRAMUSCULAR | Status: DC
  Administered 2021-06-03 (×3): 40 mg via INTRAVENOUS
  Filled 2021-06-02 (×3): qty 1

## 2021-06-02 MED ORDER — PREDNISONE 20 MG PO TABS
40.0000 mg | ORAL_TABLET | Freq: Every day | ORAL | Status: DC
  Filled 2021-06-02: qty 2

## 2021-06-02 MED ORDER — NICOTINE 21 MG/24HR TD PT24
21.0000 mg | MEDICATED_PATCH | Freq: Every day | TRANSDERMAL | Status: DC
  Administered 2021-06-03 – 2021-06-04 (×2): 21 mg via TRANSDERMAL
  Filled 2021-06-02 (×2): qty 1

## 2021-06-02 MED ORDER — THIAMINE HCL 100 MG PO TABS
100.0000 mg | ORAL_TABLET | Freq: Every day | ORAL | Status: DC
  Administered 2021-06-03 – 2021-06-04 (×2): 100 mg via ORAL
  Filled 2021-06-02 (×2): qty 1

## 2021-06-02 MED ORDER — ALBUTEROL SULFATE (2.5 MG/3ML) 0.083% IN NEBU
INHALATION_SOLUTION | RESPIRATORY_TRACT | Status: AC
  Administered 2021-06-02: 10 mg
  Filled 2021-06-02: qty 12

## 2021-06-02 MED ORDER — PANTOPRAZOLE SODIUM 40 MG PO TBEC
40.0000 mg | DELAYED_RELEASE_TABLET | Freq: Every day | ORAL | Status: DC
  Administered 2021-06-03 – 2021-06-04 (×2): 40 mg via ORAL
  Filled 2021-06-02 (×2): qty 1

## 2021-06-02 MED ORDER — AMLODIPINE BESYLATE 5 MG PO TABS
10.0000 mg | ORAL_TABLET | Freq: Every day | ORAL | Status: DC
  Administered 2021-06-03 – 2021-06-04 (×2): 10 mg via ORAL
  Filled 2021-06-02 (×2): qty 2

## 2021-06-02 MED ORDER — ACETAMINOPHEN 325 MG PO TABS: 650.0000 mg | ORAL_TABLET | Freq: Four times a day (QID) | ORAL | Status: DC | PRN

## 2021-06-02 MED ORDER — IPRATROPIUM-ALBUTEROL 0.5-2.5 (3) MG/3ML IN SOLN
3.0000 mL | Freq: Once | RESPIRATORY_TRACT | Status: AC
  Administered 2021-06-02: 3 mL via RESPIRATORY_TRACT
  Filled 2021-06-02: qty 3

## 2021-06-02 MED ORDER — ENOXAPARIN SODIUM 40 MG/0.4ML IJ SOSY
40.0000 mg | PREFILLED_SYRINGE | INTRAMUSCULAR | Status: DC
  Administered 2021-06-02 – 2021-06-03 (×2): 40 mg via SUBCUTANEOUS
  Filled 2021-06-02 (×2): qty 0.4

## 2021-06-02 MED ORDER — ACETAMINOPHEN 650 MG RE SUPP: 650.0000 mg | Freq: Four times a day (QID) | RECTAL | Status: DC | PRN

## 2021-06-02 NOTE — H&P (Signed)
History and Physical    Jesus Walker [id number] DOB: 01-13-1961 DOA: 06/02/2021  PCP: Rosita Fire, MD (Confirm with patient/family/NH records and if not entered, this has to be entered at [facility] point of entry) Patient coming from: home  I have personally briefly reviewed patient's old medical records in [place]  Chief Complaint: worsening SOB with wheezing  HPI: Jesus Walker is a 61 y.o. male with medical history significant of of alcohol dependence, tobacco abuse, COPD, GERD, hypertension came to ED due to progressively worse dyspnea associated with cough, wheezing. Last admission for similar problem 04/24/20. He presents to [facility] for evaluation and treatment. No report of fever, sputum production, chest pain.  ED Course: T97.7  143/87  HR 98  RR 16  BNP 28  Troponin 6, WBC 5.8 with 44/37/12/6 eos, Na 126. CXR-NAD. Per EDP note patient presented in respiratory distress with diffuse wheezing. IN ED he received IV solumedrol and continuous neb treatments with some improvement. TRH called to admit for continue treatment.   Review of Systems: As per HPI otherwise 10 point review of systems negative.    Past Medical History:  Diagnosis Date   Alcohol dependence ([place])    Asthma    COPD (chronic obstructive pulmonary disease) ([place])    GERD (gastroesophageal reflux disease)    Hypertension     Past Surgical History:  Procedure Laterality Date   BRONCHIAL BRUSHINGS  01/03/2016   Procedure: BRONCHIAL BRUSHINGS;  Surgeon: Sinda Du, MD;  Location: [facility];  Service: Cardiopulmonary;;   BRONCHIAL WASHINGS  01/03/2016   Procedure: BRONCHIAL WASHINGS;  Surgeon: Sinda Du, MD;  Location: [facility];  Service: Cardiopulmonary;;   FLEXIBLE BRONCHOSCOPY N/A 01/03/2016   Procedure: FLEXIBLE BRONCHOSCOPY;  Surgeon: Sinda Du, MD;  Location: [facility];  Service: Cardiopulmonary;  Laterality: N/A;    Soc Hx - serial unmarried monogomy, currently living with  girlfriend. Has one daughter, 8 grandchildren. On disability. 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   reports that he has been smoking cigarettes. He has been smoking an average of 1 pack per day. He has never used smokeless tobacco. He reports current alcohol use of about 12.0 standard drinks per week. He reports that he does not use drugs.  No Known Allergies  Family History  Problem Relation Age of Onset   Asthma Mother 37   Asthma Father 25    Prior to Admission medications   Medication Sig Start Date End Date Taking? Authorizing Provider  acetaminophen (TYLENOL) 500 MG tablet Take 500 mg by mouth every 6 (six) hours as needed.   Yes [provider]  albuterol (PROVENTIL) (2.5 MG/3ML) 0.083% nebulizer solution Take 2.5 mg by nebulization 4 (four) times daily. 05/31/21  Yes [provider]  amLODipine (NORVASC) 10 MG tablet Take 10 mg by mouth daily. 05/31/21  Yes [provider]  budesonide-formoterol (SYMBICORT) 160-4.5 MCG/ACT inhaler Inhale 2 puffs into the lungs 2 (two) times daily. 06/14/18  Yes Barton Dubois, MD  folic acid (FOLVITE) 1 MG tablet Take 1 mg by mouth daily. 05/01/20  Yes [provider]  naproxen sodium (ALEVE) 220 MG tablet Take 220 mg by mouth.   Yes [provider]  omeprazole (PRILOSEC) 20 MG capsule Take 20 mg by mouth daily.   Yes [provider]  PROAIR HFA 108 (90 Base) MCG/ACT inhaler Inhale 2 puffs into the lungs 4 (four) times daily. 12/26/19  Yes [provider]  nicotine (NICODERM CQ - DOSED  IN MG/24 HOURS) 21 mg/24hr patch Place 1 patch (21 mg total) onto the skin daily. Patient not taking: Reported on 05/03/2020 06/15/18   Barton Dubois, MD  predniSONE (DELTASONE) 10 MG tablet Take 2 tablets (20 mg total) by mouth daily. Patient not taking: Reported on 06/02/2021 05/20/20   Milton Ferguson, MD  thiamine 100 MG tablet Take 1 tablet (100 mg total) by mouth daily. Patient not taking: Reported on 06/02/2021  04/28/20   Oswald Hillock, MD    Physical Exam: Vitals:   06/02/21 1716 06/02/21 1717 06/02/21 1900 06/02/21 2030  BP:   (!) 150/67 (!) 143/87  Pulse:   96 98  Resp: (!) 22  19 16   Temp:      TempSrc:      SpO2:  97% 98% 98%  Weight:      Height:        Vitals:   06/02/21 1716 06/02/21 1717 06/02/21 1900 06/02/21 2030  BP:   (!) 150/67 (!) 143/87  Pulse:   96 98  Resp: (!) 22  19 16   Temp:      TempSrc:      SpO2:  97% 98% 98%  Weight:      Height:       General:  very thin, malnourished appearing man in no respiratory distress Eyes: PERRL, muddy conjunctiva, lids and conjunctivae normal ENMT: Mucous membranes are moist. Posterior pharynx clear of any exudate or lesions.Poor dentition missing many teeth.  Neck: normal, supple, no masses, no thyromegaly Respiratory: Decreased air movement, prolonged expiratory phase, coarse rhonchi throughout, expiratory wheezing.no use of accessory muscles..  Cardiovascular: Regular rate and rhythm, no murmurs / rubs / gallops. No extremity edema. 2+ pedal pulses. No carotid bruits.  Abdomen: scaphoid, no tenderness, no masses palpated. No hepatosplenomegaly. Bowel sounds positive.  Musculoskeletal: no clubbing / cyanosis. No joint deformity upper and lower extremities. Good ROM, no contractures. Normal muscle tone.  Skin: no rashes, lesions, ulcers. No induration Neurologic: CN 2-12 grossly intact. Psychiatric: Normal judgment and insight. Alert and oriented x 3. Normal mood.     Labs on Admission: I have personally reviewed following labs and imaging studies  CBC: Recent Labs  Lab 06/02/21 1819  WBC 5.8  NEUTROABS 2.5  HGB 15.4  HCT 43.7  MCV 99.5  PLT 096   Basic Metabolic Panel: Recent Labs  Lab 06/02/21 1819  NA 126*  K 3.9  CL 91*  CO2 24  GLUCOSE 88  BUN 8  CREATININE 0.78  CALCIUM 9.1   GFR: Estimated Creatinine Clearance: 73.9 mL/min (by C-G formula based on SCr of 0.78 mg/dL). Liver Function Tests: Recent  Labs  Lab 06/02/21 1819  AST 31  ALT 17  ALKPHOS 89  BILITOT 0.9  PROT 7.5  ALBUMIN 4.0   No results for input(s): LIPASE, AMYLASE in the last 168 hours. No results for input(s): AMMONIA in the last 168 hours. Coagulation Profile: Recent Labs  Lab 06/02/21 1819  INR 0.9   Cardiac Enzymes: No results for input(s): CKTOTAL, CKMB, CKMBINDEX, TROPONINI in the last 168 hours. BNP (last 3 results) No results for input(s): PROBNP in the last 8760 hours. HbA1C: No results for input(s): HGBA1C in the last 72 hours. CBG: No results for input(s): GLUCAP in the last 168 hours. Lipid Profile: No results for input(s): CHOL, HDL, LDLCALC, TRIG, CHOLHDL, LDLDIRECT in the last 72 hours. Thyroid Function Tests: No results for input(s): TSH, T4TOTAL, FREET4, T3FREE, THYROIDAB in the last 72 hours. Anemia  Panel: No results for input(s): VITAMINB12, FOLATE, FERRITIN, TIBC, IRON, RETICCTPCT in the last 72 hours. Urine analysis:    Component Value Date/Time   COLORURINE AMBER (A) 05/21/2016 1145   APPEARANCEUR CLEAR 05/21/2016 1145   LABSPEC <1.005 (L) 05/21/2016 1145   PHURINE 6.0 05/21/2016 1145   GLUCOSEU NEGATIVE 05/21/2016 1145   HGBUR NEGATIVE 05/21/2016 1145   BILIRUBINUR NEGATIVE 05/21/2016 1145   KETONESUR NEGATIVE 05/21/2016 1145   PROTEINUR NEGATIVE 05/21/2016 1145   NITRITE NEGATIVE 05/21/2016 1145   LEUKOCYTESUR NEGATIVE 05/21/2016 1145    Radiological Exams on Admission: DG Chest Port 1 View  Result Date: 06/02/2021 CLINICAL DATA:  61 year old male with history of cough, shortness of breath and fever with diarrhea. EXAM: PORTABLE CHEST 1 VIEW COMPARISON:  Chest x-ray 05/20/2020. FINDINGS: Elevation of the right hemidiaphragm. Lung volumes are normal. No consolidative airspace disease. No pleural effusions. No pneumothorax. No pulmonary nodule or mass noted. Pulmonary vasculature and the cardiomediastinal silhouette are within normal limits. IMPRESSION: 1.  No radiographic  evidence of acute cardiopulmonary disease. 2. Elevation of the right hemidiaphragm. Electronically Signed   By: Vinnie Langton M.D.   On: 06/02/2021 18:03    EKG: Independently reviewed. NSR, old anterior injury, no acute changes  Assessment/Plan Principal Problem:   COPD with acute exacerbation (HCC) Active Problems:   Hyponatremia   Alcohol dependence ([place])   Tobacco dependence   COPD exacerbation (HCC)  COPD with acute exacerbation - last admitted Nov '21. He continues to smoke. No evidence of bacterial infection. Covid negative. Plan Med-surg admit  Continue home breathing treatments  Solumedrol 40 mg q6 x 4 than switch to prednisone  Smoking cessation - nicoderm patch ordered.  Oxygen to keep sats >88%  2. Hyponatremia - chronic problem related to beer potomania. He is at his baseline  Plan NS IVF  F/u Bmet  3. Alcohol dependence - stable 6 pack a day. No signs of withdrawal   4. Tobacco abuse - nicoderm patch  5. HTN - continue home meds  6. Nutrition - appears malnourished Plan Prealbumin and total protein  RD consult   DVT prophylaxis: lovenox  Code Status: full code Family Communication: spoke to Evlyn Courier (sister and emergency contact)  Disposition Plan: home when medically stable  Consults called: none  Admission status: inpatient    Adella Hare MD [facility] Pager [phone]  If 7PM-7AM, please contact night-coverage [email or web address] Password [email or web address]  06/02/2021, 9:15 PM

## 2021-06-02 NOTE — ED Notes (Signed)
Pt bed linens changed due to urinary incontinence.

## 2021-06-02 NOTE — ED Provider Notes (Signed)
[facility] Provider Note   CSN: [id number] Arrival date & time: 06/02/21  1641     History  Chief Complaint  Patient presents with   Shortness of Breath    Jesus Walker is a 61 y.o. male.   Shortness of Breath  This patient is a 61 year old male, he has a history of hypertension on amlodipine, he has a history of nicotine dependence and chronic smoking.  He has been diagnosed with COPD and takes both ProAir inhaler as well as albuterol nebulized treatments frequently.  He is not on oxygen at home.  The patient presents by EMS because of severe shortness of breath which has been worsening over the last couple of days.  He has had some fever, some diarrhea, he is coughing intermittently and occasionally has sputum production.  He denies swelling of his legs, states he last saw his doctor about a month ago, he sees Dr. Legrand Rams in the community.  The patient has no abdominal discomfort, he does have a tightness across his chest associated with his severe wheezing.  He was 89% on room air and was placed on nonrebreather by paramedics prehospital with improvement of his oxygenation.  Home Medications Prior to Admission medications   Medication Sig Start Date End Date Taking? Authorizing Provider  acetaminophen (TYLENOL) 500 MG tablet Take 500 mg by mouth every 6 (six) hours as needed.   Yes [provider]  albuterol (PROVENTIL) (2.5 MG/3ML) 0.083% nebulizer solution Take 2.5 mg by nebulization 4 (four) times daily. 05/31/21  Yes [provider]  amLODipine (NORVASC) 10 MG tablet Take 10 mg by mouth daily. 05/31/21  Yes [provider]  budesonide-formoterol (SYMBICORT) 160-4.5 MCG/ACT inhaler Inhale 2 puffs into the lungs 2 (two) times daily. 06/14/18  Yes Barton Dubois, MD  folic acid (FOLVITE) 1 MG tablet Take 1 mg by mouth daily. 05/01/20  Yes [provider]  naproxen sodium (ALEVE) 220 MG tablet Take 220 mg by mouth.   Yes  [provider]  omeprazole (PRILOSEC) 20 MG capsule Take 20 mg by mouth daily.   Yes [provider]  PROAIR HFA 108 (90 Base) MCG/ACT inhaler Inhale 2 puffs into the lungs 4 (four) times daily. 12/26/19  Yes [provider]  nicotine (NICODERM CQ - DOSED IN MG/24 HOURS) 21 mg/24hr patch Place 1 patch (21 mg total) onto the skin daily. Patient not taking: Reported on 05/03/2020 06/15/18   Barton Dubois, MD  predniSONE (DELTASONE) 10 MG tablet Take 2 tablets (20 mg total) by mouth daily. Patient not taking: Reported on 06/02/2021 05/20/20   Milton Ferguson, MD  thiamine 100 MG tablet Take 1 tablet (100 mg total) by mouth daily. Patient not taking: Reported on 06/02/2021 04/28/20   Oswald Hillock, MD      Allergies    Patient has no known allergies.    Review of Systems   Review of Systems  Respiratory:  Positive for shortness of breath.   All other systems reviewed and are negative.  Physical Exam Updated Vital Signs BP (!) 150/67    Pulse 96    Temp 97.7 F (36.5 C) (Oral)    Resp 19    Ht 1.575 m (5\' 2" )    Wt 61.2 kg    SpO2 98%    BMI 24.69 kg/m  Physical Exam Vitals and nursing note reviewed.  Constitutional:      General: He is in acute distress.     Appearance: He is  well-developed.  HENT:     Head: Normocephalic and atraumatic.     Mouth/Throat:     Pharynx: No oropharyngeal exudate.  Eyes:     General: No scleral icterus.       Right eye: No discharge.        Left eye: No discharge.     Conjunctiva/sclera: Conjunctivae normal.     Pupils: Pupils are equal, round, and reactive to light.  Neck:     Thyroid: No thyromegaly.     Vascular: No JVD.     Comments: JVD is present in the sitting position, he is sitting bolt upright Cardiovascular:     Rate and Rhythm: Regular rhythm. Tachycardia present.     Heart sounds: Normal heart sounds. No murmur heard.   No friction rub. No gallop.  Pulmonary:     Effort: Respiratory distress present.      Breath sounds: Wheezing present. No rales.     Comments: The patient has increased work of breathing, speaks in 2-3 word sentences, he has diffuse expiratory wheezing, wheezing in all lung fields.  He has a prolonged expiratory phase, he has no rales or rhonchi.  He appears to be using some accessory muscles Abdominal:     General: Bowel sounds are normal. There is no distension.     Palpations: Abdomen is soft. There is no mass.     Tenderness: There is no abdominal tenderness.  Musculoskeletal:        General: No tenderness. Normal range of motion.     Cervical back: Normal range of motion and neck supple.  Lymphadenopathy:     Cervical: No cervical adenopathy.  Skin:    General: Skin is warm and dry.     Findings: No erythema or rash.  Neurological:     General: No focal deficit present.     Mental Status: He is alert.     Coordination: Coordination normal.  Psychiatric:        Behavior: Behavior normal.    ED Results / Procedures / Treatments   Labs (all labs ordered are listed, but only abnormal results are displayed) Labs Reviewed  COMPREHENSIVE METABOLIC PANEL - Abnormal; Notable for the following components:      Result Value   Sodium 126 (*)    Chloride 91 (*)    All other components within normal limits  CBC WITH DIFFERENTIAL/PLATELET - Abnormal; Notable for the following components:   MCH 35.1 (*)    All other components within normal limits  RESP PANEL BY RT-PCR (FLU A&B, COVID) ARPGX2  BRAIN NATRIURETIC PEPTIDE  PROTIME-INR  TROPONIN I (HIGH SENSITIVITY)    EKG EKG Interpretation  Date/Time:  Sunday June 02 2021 16:57:48 EST Ventricular Rate:  99 PR Interval:  142 QRS Duration: 100 QT Interval:  347 QTC Calculation: 443 R Axis:   144 Text Interpretation: Sinus rhythm Anterior infarct, age indeterminate Confirmed by Noemi Chapel [phone]) on 06/02/2021 6:08:55 PM  Radiology DG Chest Port 1 View  Result Date: 06/02/2021 CLINICAL DATA:  61 year old male  with history of cough, shortness of breath and fever with diarrhea. EXAM: PORTABLE CHEST 1 VIEW COMPARISON:  Chest x-ray 05/20/2020. FINDINGS: Elevation of the right hemidiaphragm. Lung volumes are normal. No consolidative airspace disease. No pleural effusions. No pneumothorax. No pulmonary nodule or mass noted. Pulmonary vasculature and the cardiomediastinal silhouette are within normal limits. IMPRESSION: 1.  No radiographic evidence of acute cardiopulmonary disease. 2. Elevation of the right hemidiaphragm. Electronically Signed   By:  Vinnie Langton M.D.   On: 06/02/2021 18:03    Procedures .Critical Care Performed by: Noemi Chapel, MD Authorized by: Noemi Chapel, MD   Critical care provider statement:    Critical care time (minutes):  30   Critical care time was exclusive of:  Teaching time and separately billable procedures and treating other patients   Critical care was necessary to treat or prevent imminent or life-threatening deterioration of the following conditions:  Respiratory failure   Critical care was time spent personally by me on the following activities:  Development of treatment plan with patient or surrogate, discussions with consultants, evaluation of patient's response to treatment, examination of patient, ordering and review of laboratory studies, ordering and review of radiographic studies, ordering and performing treatments and interventions, pulse oximetry, re-evaluation of patient's condition, review of old charts and obtaining history from patient or surrogate   I assumed direction of critical care for this patient from another provider in my specialty: no     Care discussed with: admitting provider and accepting provider at another facility   Comments:          Medications Ordered in ED Medications  albuterol (PROVENTIL,VENTOLIN) solution continuous neb (10 mg Nebulization Not Given 06/02/21 1740)  methylPREDNISolone sodium succinate (SOLU-MEDROL) 125 mg/2 mL  injection 125 mg (125 mg Intravenous Given 06/02/21 1735)  ipratropium-albuterol (DUONEB) 0.5-2.5 (3) MG/3ML nebulizer solution 3 mL (3 mLs Nebulization Given 06/02/21 1740)  albuterol (PROVENTIL) (2.5 MG/3ML) 0.083% nebulizer solution (10 mg  Given 06/02/21 1740)    ED Course/ Medical Decision Making/ A&P                           Medical Decision Making  This patient presents to the ED for concern of shortness of breath and respiratory distress, this involves an extensive number of treatment options, and is a complaint that carries with it a high risk of complications and morbidity.  The differential diagnosis includes COPD exacerbation, pneumothorax, pneumonia, sepsis, less likely to be pulmonary embolism, would consider congestive heart failure given the JVD   Co morbidities that complicate the patient evaluation  Severe COPD, chronic tobacco use, hypertension, hypoxia   Additional history obtained:  Additional history obtained from from the medical record External records from outside source obtained and reviewed including the patient was last admitted to the hospital approximately 1 month ago with a COPD exacerbation, he has been seen in the emergency department twice since that time.  He was also admitted a couple of years ago.   Lab Tests:  I Ordered, and personally interpreted labs.  The pertinent results include:  chronic hyponatremia.  No leukocytosis, no anemia, normal troponin   Imaging Studies ordered:  I ordered imaging studies including portable chest x-ray I independently visualized and interpreted imaging which showed elevated right hemidiaphragm, no pneumonia I agree with the radiologist interpretation   Cardiac Monitoring:  The patient was maintained on a cardiac monitor.  I personally viewed and interpreted the cardiac monitored which showed an underlying rhythm of: Sinus tachycardia   Medicines ordered and prescription drug management:  I ordered medication  including continuous albuterol treatments, magnesium, Solu-Medrol for acute respiratory distress or shortness of breath Reevaluation of the patient after these medicines showed that the patient improved I have reviewed the patients home medicines and have made adjustments as needed   Test Considered:  CT scan of the chest no PE seems less likely   Critical Interventions:  Continuous nebulizer therapy, magnesium, steroids, admission to the hospital   Consultations Obtained:  I requested consultation with the hospitalist Dr. Linda Hedges,  and discussed lab and imaging findings as well as pertinent plan - they recommend: And agree with admission to the hospital   Problem List / ED Course:  Severe COPD and acute respiratory distress, hypoxic respiratory failure, admitted to the hospital after getting multiple medications as above   Reevaluation:  After the interventions noted above, I reevaluated the patient and found that they have :improved   Social Determinants of Health:  None   Dispostion:  After consideration of the diagnostic results and the patients response to treatment, I feel that the patent would benefit from admission to the hospital.          Final Clinical Impression(s) / ED Diagnoses Final diagnoses:  Acute respiratory failure with hypoxia ([place])  COPD exacerbation ([place])    Rx / DC Orders ED Discharge Orders     None         Noemi Chapel, MD 06/02/21 1953

## 2021-06-02 NOTE — ED Notes (Signed)
Placed on 3l [place] oxygen.

## 2021-06-02 NOTE — ED Triage Notes (Addendum)
Patient reports SOB for the past two days with fever and diarrhea. Sats 89% on RA, EMS placed on NRB, increased to 100%. Complaining of chest pain.

## 2021-06-02 NOTE — ED Notes (Signed)
Pt bed has been changed. Warm blanket given.

## 2021-06-03 DIAGNOSIS — F17200 Nicotine dependence, unspecified, uncomplicated: Secondary | ICD-10-CM

## 2021-06-03 DIAGNOSIS — J9601 Acute respiratory failure with hypoxia: Secondary | ICD-10-CM

## 2021-06-03 DIAGNOSIS — E871 Hypo-osmolality and hyponatremia: Secondary | ICD-10-CM

## 2021-06-03 LAB — BASIC METABOLIC PANEL
Anion gap: 11 (ref 5–15)
BUN: 10 mg/dL (ref 8–23)
CO2: 26 mmol/L (ref 22–32)
Calcium: 9.1 mg/dL (ref 8.9–10.3)
Chloride: 94 mmol/L — ABNORMAL LOW (ref 98–111)
Creatinine, Ser: 0.86 mg/dL (ref 0.61–1.24)
GFR, Estimated: >60 mL/min (ref >60)
Glucose, Bld: 133 mg/dL — ABNORMAL HIGH (ref 70–99)
Potassium: 4.2 mmol/L (ref 3.5–5.1)
Sodium: 131 mmol/L — ABNORMAL LOW (ref 135–145)

## 2021-06-03 LAB — HIV ANTIBODY (ROUTINE TESTING W REFLEX): HIV Screen 4th Generation wRfx: NONREACTIVE

## 2021-06-03 LAB — PROTEIN, TOTAL: Total Protein: 7.2 g/dL (ref 6.5–8.1)

## 2021-06-03 LAB — PREALBUMIN: Prealbumin: 16.8 mg/dL — ABNORMAL LOW (ref 18–38)

## 2021-06-03 MED ORDER — BUDESONIDE 0.5 MG/2ML IN SUSP
0.5000 mg | Freq: Two times a day (BID) | RESPIRATORY_TRACT | Status: DC
  Administered 2021-06-03 – 2021-06-04 (×3): 0.5 mg via RESPIRATORY_TRACT
  Filled 2021-06-03 (×2): qty 2

## 2021-06-03 MED ORDER — METHYLPREDNISOLONE SODIUM SUCC 40 MG IJ SOLR
40.0000 mg | Freq: Two times a day (BID) | INTRAMUSCULAR | Status: DC
  Administered 2021-06-03 – 2021-06-04 (×2): 40 mg via INTRAVENOUS
  Filled 2021-06-03 (×2): qty 1

## 2021-06-03 MED ORDER — ONDANSETRON HCL 4 MG/2ML IJ SOLN: 4.0000 mg | Freq: Four times a day (QID) | INTRAMUSCULAR | Status: DC | PRN

## 2021-06-03 MED ORDER — ENSURE ENLIVE PO LIQD
237.0000 mL | Freq: Two times a day (BID) | ORAL | Status: DC
  Administered 2021-06-03 – 2021-06-04 (×2): 237 mL via ORAL

## 2021-06-03 MED ORDER — IPRATROPIUM-ALBUTEROL 0.5-2.5 (3) MG/3ML IN SOLN
3.0000 mL | Freq: Four times a day (QID) | RESPIRATORY_TRACT | Status: DC
  Administered 2021-06-03 – 2021-06-04 (×5): 3 mL via RESPIRATORY_TRACT
  Filled 2021-06-03 (×4): qty 3

## 2021-06-03 NOTE — TOC Progression Note (Signed)
Transition of Care [facility]) - Progression Note    Patient Details  Name: DORRIEN GRUNDER MRN: [id number] Date of Birth: Dec 25, 1960  [facility]) CM/SW Contact  Salome Arnt, [place] Phone Number: 06/03/2021, 10:16 AM  Clinical Narrative:   Transition of Care [facility]) Screening Note   Patient Details  Name: JAYDIS DUCHENE Date of Birth: 1960-12-13   Transition of Care [facility]) CM/SW Contact:    Salome Arnt, [place] Phone Number: 06/03/2021, 10:16 AM    Transition of Care Department [facility]) has reviewed patient and no TOC needs have been identified at this time. We will continue to monitor patient advancement through interdisciplinary progression rounds. If new patient transition needs arise, please place a TOC consult.            Expected Discharge Plan and Services                                                 Social Determinants of Health (SDOH) Interventions    Readmission Risk Interventions No flowsheet data found.

## 2021-06-03 NOTE — Progress Notes (Addendum)
PROGRESS NOTE  Jesus Walker XBL:[id number] DOB: 11/04/1960 DOA: 06/02/2021 PCP: Rosita Fire, MD  Brief History:  61 year old male with a history of hypertension, COPD, tobacco abuse, and GERD presenting with 1 week history of shortness of breath that is significantly worsened over the past 2 days.  Unfortunately, he continues to smoke 1/2 pack/day.  He has at least a 50-pack-year history of tobacco.  He complains of a largely nonproductive cough.  Denies any hemoptysis, fever, chills, nausea, vomiting, diarrhea.  He has some chest tightness with his coughing.  He denies any worsening lower extremity edema. In the ED, the patient was afebrile hemodynamically stable.  Patient was initially 89% on room air.  He was started on Solu-Medrol and bronchodilators for COPD exacerbation.  Chest x-ray was negative for acute findings.  Assessment/Plan: Acute respiratory failure with hypoxia -Secondary COPD exacerbation -Presented with oxygen saturation 99% and tachypnea -Stable on 2 L -Wean oxygen for saturation greater 92%  COPD exacerbation -Start Pulmicort -Start duo nebs -Continue IV Solu-Medrol  Essential hypertension -Continue amlodipine  Tobacco abuse -Tobacco cessation discussed  Hyponatremia -Due to poor solute intake and volume depletion -Continue normal saline>>increase to 75 cc/hr        Family Communication:  no Family at bedside  Consultants:  none  Code Status:  FULL   DVT Prophylaxis: [place] Lovenox   Procedures: As Listed in Progress Note Above  Antibiotics: None       Subjective: Patient states that he is breathing a little bit better than yesterday.  He still has dyspnea with minimal exertion and with conversation.  He denies any fevers, chills, nausea, vomiting, diarrhea.  He still has a nonproductive cough.  There is no hemoptysis.  Objective: Vitals:   06/02/21 2154 06/03/21 0454 06/03/21 0715 06/03/21 0755  BP:  133/76  133/69   Pulse:  97  96  Resp:  17  16  Temp:  97.7 F (36.5 C)  98.4 F (36.9 C)  TempSrc:    Oral  SpO2: 98% 96% 96% 97%  Weight:      Height:        Intake/Output Summary (Last 24 hours) at 06/03/2021 1212 Last data filed at 06/03/2021 0926 Gross per 24 hour  Intake 352 ml  Output 550 ml  Net -198 ml   Weight change:  Exam:  General:  Pt is alert, follows commands appropriately, not in acute distress HEENT: No icterus, No thrush, No neck mass, [place]/AT Cardiovascular: RRR, S1/S2, no rubs, no gallops Respiratory: Bilateral diminished breath sounds.  Bibasilar rales.  Bilateral expiratory wheeze. Abdomen: Soft/+BS, non tender, non distended, no guarding Extremities: No edema, No lymphangitis, No petechiae, No rashes, no synovitis   Data Reviewed: I have personally reviewed following labs and imaging studies Basic Metabolic Panel: Recent Labs  Lab 06/02/21 1819 06/03/21 0533  NA 126* 131*  K 3.9 4.2  CL 91* 94*  CO2 24 26  GLUCOSE 88 133*  BUN 8 10  CREATININE 0.78 0.86  CALCIUM 9.1 9.1   Liver Function Tests: Recent Labs  Lab 06/02/21 1819 06/03/21 0533  AST 31  --   ALT 17  --   ALKPHOS 89  --   BILITOT 0.9  --   PROT 7.5 7.2  ALBUMIN 4.0  --    No results for input(s): LIPASE, AMYLASE in the last 168 hours. No results for input(s): AMMONIA in the last 168 hours. Coagulation Profile: Recent Labs  Lab 06/02/21 1819  INR 0.9   CBC: Recent Labs  Lab 06/02/21 1819  WBC 5.8  NEUTROABS 2.5  HGB 15.4  HCT 43.7  MCV 99.5  PLT 234   Cardiac Enzymes: No results for input(s): CKTOTAL, CKMB, CKMBINDEX, TROPONINI in the last 168 hours. BNP: Invalid input(s): POCBNP CBG: No results for input(s): GLUCAP in the last 168 hours. HbA1C: No results for input(s): HGBA1C in the last 72 hours. Urine analysis:    Component Value Date/Time   COLORURINE AMBER (A) 05/21/2016 1145   APPEARANCEUR CLEAR 05/21/2016 1145   LABSPEC <1.005 (L) 05/21/2016 1145   PHURINE 6.0  05/21/2016 1145   GLUCOSEU NEGATIVE 05/21/2016 1145   HGBUR NEGATIVE 05/21/2016 1145   BILIRUBINUR NEGATIVE 05/21/2016 1145   KETONESUR NEGATIVE 05/21/2016 1145   PROTEINUR NEGATIVE 05/21/2016 1145   NITRITE NEGATIVE 05/21/2016 1145   LEUKOCYTESUR NEGATIVE 05/21/2016 1145   Sepsis Labs: @LABRCNTIP (procalcitonin:4,lacticidven:4) ) Recent Results (from the past 240 hour(s))  Resp Panel by RT-PCR (Flu A&B, Covid) Nasopharyngeal Swab     Status: None   Collection Time: 06/02/21  6:32 PM   Specimen: Nasopharyngeal Swab; Nasopharyngeal(NP) swabs in vial transport medium  Result Value Ref Range Status   SARS Coronavirus 2 by RT PCR NEGATIVE NEGATIVE Final    Comment: (NOTE) SARS-CoV-2 target nucleic acids are NOT DETECTED.  The SARS-CoV-2 RNA is generally detectable in upper respiratory specimens during the acute phase of infection. The lowest concentration of SARS-CoV-2 viral copies this assay can detect is 138 copies/mL. A negative result does not preclude SARS-Cov-2 infection and should not be used as the sole basis for treatment or other patient management decisions. A negative result may occur with  improper specimen collection/handling, submission of specimen other than nasopharyngeal swab, presence of viral mutation(s) within the areas targeted by this assay, and inadequate number of viral copies(<138 copies/mL). A negative result must be combined with clinical observations, patient history, and epidemiological information. The expected result is Negative.  Fact Sheet for Patients:  EntrepreneurPulse.com.au  Fact Sheet for Healthcare Providers:  IncredibleEmployment.be  This test is no t yet approved or cleared by the [place] FDA and  has been authorized for detection and/or diagnosis of SARS-CoV-2 by FDA under an Emergency Use Authorization (EUA). This EUA will remain  in effect (meaning this test can be used) for the duration of  the COVID-19 declaration under Section 564(b)(1) of the Act, 21 U.S.C.section 360bbb-3(b)(1), unless the authorization is terminated  or revoked sooner.       Influenza A by PCR NEGATIVE NEGATIVE Final   Influenza B by PCR NEGATIVE NEGATIVE Final    Comment: (NOTE) The Xpert Xpress SARS-CoV-2/FLU/RSV plus assay is intended as an aid in the diagnosis of influenza from Nasopharyngeal swab specimens and should not be used as a sole basis for treatment. Nasal washings and aspirates are unacceptable for Xpert Xpress SARS-CoV-2/FLU/RSV testing.  Fact Sheet for Patients: EntrepreneurPulse.com.au  Fact Sheet for Healthcare Providers: IncredibleEmployment.be  This test is not yet approved or cleared by the [place] FDA and has been authorized for detection and/or diagnosis of SARS-CoV-2 by FDA under an Emergency Use Authorization (EUA). This EUA will remain in effect (meaning this test can be used) for the duration of the COVID-19 declaration under Section 564(b)(1) of the Act, 21 U.S.C. section 360bbb-3(b)(1), unless the authorization is terminated or revoked.  Performed at [facility], [address] [place], [place] [address]      Scheduled Meds:  amLODipine  10  mg Oral Daily   budesonide (PULMICORT) nebulizer solution  0.5 mg Nebulization BID   enoxaparin (LOVENOX) injection  40 mg Subcutaneous Q24H   feeding supplement  237 mL Oral BID BM   folic acid  1 mg Oral Daily   ipratropium-albuterol  3 mL Nebulization Q6H   methylPREDNISolone (SOLU-MEDROL) injection  40 mg Intravenous Q6H   Followed by   Derrill Memo ON 06/04/2021] predniSONE  40 mg Oral Q breakfast   nicotine  21 mg Transdermal Daily   pantoprazole  40 mg Oral Daily   thiamine  100 mg Oral Daily   Continuous Infusions:  sodium chloride 50 mL/hr at 06/02/21 2203    Procedures/Studies: DG Chest Port 1 View  Result Date: 06/02/2021 CLINICAL DATA:  61 year old male with history  of cough, shortness of breath and fever with diarrhea. EXAM: PORTABLE CHEST 1 VIEW COMPARISON:  Chest x-ray 05/20/2020. FINDINGS: Elevation of the right hemidiaphragm. Lung volumes are normal. No consolidative airspace disease. No pleural effusions. No pneumothorax. No pulmonary nodule or mass noted. Pulmonary vasculature and the cardiomediastinal silhouette are within normal limits. IMPRESSION: 1.  No radiographic evidence of acute cardiopulmonary disease. 2. Elevation of the right hemidiaphragm. Electronically Signed   By: Vinnie Langton M.D.   On: 06/02/2021 18:03    Orson Eva, DO  [facility]  If 7PM-7AM, please contact night-coverage [email or web address] Password [email or web address] 06/03/2021, 12:12 PM   LOS: 1 day

## 2021-06-03 NOTE — Progress Notes (Addendum)
Initial Nutrition Assessment  DOCUMENTATION CODES:      INTERVENTION:  Calorie count ordered by MD- to start lunch today. Nursing and nutrition notified.   Ensure Enlive po BID, (Vanilla or Strawberry flavor)  Snacks between meals from nursing nourishment room ad lib  NUTRITION DIAGNOSIS:   Predicted suboptimal nutrient intake related to social / environmental circumstances, acute illness (tobacco/etoh use, dyspnea) as evidenced by  patient history and COPD exacerbation.   GOAL:  Patient will meet greater than or equal to 90% of their needs   MONITOR:  PO intake, Supplement acceptance, Labs, I & O's, Weight trends  REASON FOR ASSESSMENT:   Consult Calorie Count  ASSESSMENT: Patient is a 61 yo male with hx of COPD, GERD, HTN and ETOH and tobacco use. Presents with shortness of breath.   Patient has orders for calorie count. Talked with pt and nursing. Nutrition services notified.   Diet recall: Patient cooks and prepares his own meals. Breakfast is usually egg, cheese and sausage with coffee.  ONS-Ensure daily during the morning. Lunch: he is is not often hungry due to a large breakfast. Skips this meal most days. Dinner: Aldona Lento sandwich, pintos are a couple of his favorites.    Patient has poor dentition. Multiple missing teeth. Denies chewing/swallowing problem.   Patient has weight gain 15% per chart review over the past year from 05/20/20- 53.1 kg (UBW) up to current 61.2 kg. No known hx of CHF. Patient says the most he has ever weighed is 155 lb.  Labs: hyponatremia, hypo osmolality noted  BMP Latest Ref Rng & Units 06/03/2021 06/02/2021 05/20/2020  Glucose 70 - 99 mg/dL 133(H) 88 109(H)  BUN 8 - 23 mg/dL 10 8 8   Creatinine 0.61 - 1.24 mg/dL 0.86 0.78 0.90  Sodium 135 - 145 mmol/L 131(L) 126(L) 126(L)  Potassium 3.5 - 5.1 mmol/L 4.2 3.9 4.3  Chloride 98 - 111 mmol/L 94(L) 91(L) 92(L)  CO2 22 - 32 mmol/L 26 24 22   Calcium 8.9 - 10.3 mg/dL 9.1 9.1 9.4      Diet  Order:   Diet Order             Diet regular Room service appropriate? Yes; Fluid consistency: Thin  Diet effective now                   EDUCATION NEEDS:  Education needs have been addressed  Skin:  Skin Assessment: Reviewed RN Assessment  Last BM:  12/31  Height:   Ht Readings from Last 1 Encounters:  06/02/21 5\' 2"  (1.575 m)    Weight:   Wt Readings from Last 1 Encounters:  06/02/21 61.2 kg    Ideal Body Weight:   54 kg  BMI:  Body mass index is 24.69 kg/m.  Estimated Nutritional Needs:   Kcal:  1761-6073  Protein:  65-70 gr  Fluid:  per MD goals (normal needs>1600 ml daily)   Colman Cater MS,RD,CSG,LDN Contact: Shea Evans

## 2021-06-04 DIAGNOSIS — I10 Essential (primary) hypertension: Secondary | ICD-10-CM

## 2021-06-04 LAB — BASIC METABOLIC PANEL
Anion gap: 8 (ref 5–15)
BUN: 19 mg/dL (ref 8–23)
CO2: 25 mmol/L (ref 22–32)
Calcium: 8.9 mg/dL (ref 8.9–10.3)
Chloride: 100 mmol/L (ref 98–111)
Creatinine, Ser: 0.78 mg/dL (ref 0.61–1.24)
GFR, Estimated: >60 mL/min (ref >60)
Glucose, Bld: 139 mg/dL — ABNORMAL HIGH (ref 70–99)
Potassium: 4.1 mmol/L (ref 3.5–5.1)
Sodium: 133 mmol/L — ABNORMAL LOW (ref 135–145)

## 2021-06-04 LAB — MAGNESIUM: Magnesium: 1.7 mg/dL (ref 1.7–2.4)

## 2021-06-04 MED ORDER — PREDNISONE 20 MG PO TABS: 50.0000 mg | ORAL_TABLET | Freq: Every day | ORAL | Status: DC

## 2021-06-04 MED ORDER — PREDNISONE 50 MG PO TABS: 50.0000 mg | ORAL_TABLET | Freq: Every day | ORAL | 0 refills | Status: DC

## 2021-06-04 NOTE — Discharge Summary (Signed)
Physician Discharge Summary  Jesus Walker:[id number] DOB: [birth year] DOA: 06/02/2021  PCP: Rosita Fire, MD  Admit date: 06/02/2021 Discharge date: 06/04/2021  Admitted From: Home Disposition:  Home  Recommendations for Outpatient Follow-up:  Follow up with PCP in 1-2 weeks Please obtain BMP/CBC in one week    Discharge Condition: Stable CODE STATUS: FULL Diet recommendation: Heart Healthy    Brief/Interim Summary: 61 year old male with a history of hypertension, COPD, tobacco abuse, and GERD presenting with 1 week history of shortness of breath that is significantly worsened over the past 2 days.  Unfortunately, he continues to smoke 1/2 pack/day.  He has at least a 50-pack-year history of tobacco.  He complains of a largely nonproductive cough.  Denies any hemoptysis, fever, chills, nausea, vomiting, diarrhea.  He has some chest tightness with his coughing.  He denies any worsening lower extremity edema. In the ED, the patient was afebrile hemodynamically stable.  Patient was initially 89% on room air.  He was started on Solu-Medrol and bronchodilators for COPD exacerbation.  Chest x-ray was negative for acute findings.  Discharge Diagnoses:   Acute respiratory failure with hypoxia -Secondary COPD exacerbation -Presented with oxygen saturation 99% and tachypnea -Stable on 2 L>>weaned to RA -ambulatory pulse ox on day of dc did not show desaturation < 88% on RA   COPD exacerbation -Started Pulmicort -Started duo nebs -Continue IV Solu-Medrol>>d/c home with prednisone 50 mg daily x 4 more days   Essential hypertension -Continue amlodipine   Tobacco abuse -Tobacco cessation discussed   Hyponatremia -Due to poor solute intake and volume depletion -Continue normal saline>>increase to 75 cc/hr>>improved -Na 133 on day of dc      Discharge Instructions   Allergies as of 06/04/2021   No Known Allergies      Medication List     STOP taking these medications     naproxen sodium 220 MG tablet Commonly known as: ALEVE   nicotine 21 mg/24hr patch Commonly known as: NICODERM CQ - dosed in mg/24 hours   thiamine 100 MG tablet       TAKE these medications    acetaminophen 500 MG tablet Commonly known as: TYLENOL Take 500 mg by mouth every 6 (six) hours as needed.   amLODipine 10 MG tablet Commonly known as: NORVASC Take 10 mg by mouth daily.   budesonide-formoterol 160-4.5 MCG/ACT inhaler Commonly known as: Symbicort Inhale 2 puffs into the lungs 2 (two) times daily.   folic acid 1 MG tablet Commonly known as: FOLVITE Take 1 mg by mouth daily.   omeprazole 20 MG capsule Commonly known as: PRILOSEC Take 20 mg by mouth daily.   predniSONE 50 MG tablet Commonly known as: DELTASONE Take 1 tablet (50 mg total) by mouth daily with breakfast. Start taking on: June 05, 2021 What changed:  medication strength how much to take when to take this   ProAir HFA 108 (90 Base) MCG/ACT inhaler Generic drug: albuterol Inhale 2 puffs into the lungs 4 (four) times daily.   albuterol (2.5 MG/3ML) 0.083% nebulizer solution Commonly known as: PROVENTIL Take 2.5 mg by nebulization 4 (four) times daily.        No Known Allergies  Consultations: none   Procedures/Studies: DG Chest Port 1 View  Result Date: 06/02/2021 CLINICAL DATA:  61 year old male with history of cough, shortness of breath and fever with diarrhea. EXAM: PORTABLE CHEST 1 VIEW COMPARISON:  Chest x-ray 05/20/2020. FINDINGS: Elevation of the right hemidiaphragm. Lung volumes are normal. No consolidative airspace  disease. No pleural effusions. No pneumothorax. No pulmonary nodule or mass noted. Pulmonary vasculature and the cardiomediastinal silhouette are within normal limits. IMPRESSION: 1.  No radiographic evidence of acute cardiopulmonary disease. 2. Elevation of the right hemidiaphragm. Electronically Signed   By: Vinnie Langton M.D.   On: 06/02/2021 18:03         Discharge Exam: Vitals:   06/04/21 1344 06/04/21 1412  BP: 139/71   Pulse: (!) 104   Resp:    Temp: 98 F (36.7 C)   SpO2: 97% 97%   Vitals:   06/04/21 0809 06/04/21 0833 06/04/21 1344 06/04/21 1412  BP:  132/68 139/71   Pulse:  95 (!) 104   Resp:      Temp:  98.1 F (36.7 C) 98 F (36.7 C)   TempSrc:  Oral Oral   SpO2: 100% 100% 97% 97%  Weight:      Height:        General: Pt is alert, awake, not in acute distress Cardiovascular: RRR, S1/S2 +, no rubs, no gallops Respiratory: decreased BS.  Bibasilar rales. No wheeze Abdominal: Soft, NT, ND, bowel sounds + Extremities: no edema, no cyanosis   The results of significant diagnostics from this hospitalization (including imaging, microbiology, ancillary and laboratory) are listed below for reference.    Significant Diagnostic Studies: DG Chest Port 1 View  Result Date: 06/02/2021 CLINICAL DATA:  61 year old male with history of cough, shortness of breath and fever with diarrhea. EXAM: PORTABLE CHEST 1 VIEW COMPARISON:  Chest x-ray 05/20/2020. FINDINGS: Elevation of the right hemidiaphragm. Lung volumes are normal. No consolidative airspace disease. No pleural effusions. No pneumothorax. No pulmonary nodule or mass noted. Pulmonary vasculature and the cardiomediastinal silhouette are within normal limits. IMPRESSION: 1.  No radiographic evidence of acute cardiopulmonary disease. 2. Elevation of the right hemidiaphragm. Electronically Signed   By: Vinnie Langton M.D.   On: 06/02/2021 18:03    Microbiology: Recent Results (from the past 240 hour(s))  Resp Panel by RT-PCR (Flu A&B, Covid) Nasopharyngeal Swab     Status: None   Collection Time: 06/02/21  6:32 PM   Specimen: Nasopharyngeal Swab; Nasopharyngeal(NP) swabs in vial transport medium  Result Value Ref Range Status   SARS Coronavirus 2 by RT PCR NEGATIVE NEGATIVE Final    Comment: (NOTE) SARS-CoV-2 target nucleic acids are NOT DETECTED.  The SARS-CoV-2  RNA is generally detectable in upper respiratory specimens during the acute phase of infection. The lowest concentration of SARS-CoV-2 viral copies this assay can detect is 138 copies/mL. A negative result does not preclude SARS-Cov-2 infection and should not be used as the sole basis for treatment or other patient management decisions. A negative result may occur with  improper specimen collection/handling, submission of specimen other than nasopharyngeal swab, presence of viral mutation(s) within the areas targeted by this assay, and inadequate number of viral copies(<138 copies/mL). A negative result must be combined with clinical observations, patient history, and epidemiological information. The expected result is Negative.  Fact Sheet for Patients:  EntrepreneurPulse.com.au  Fact Sheet for Healthcare Providers:  IncredibleEmployment.be  This test is no t yet approved or cleared by the [place] FDA and  has been authorized for detection and/or diagnosis of SARS-CoV-2 by FDA under an Emergency Use Authorization (EUA). This EUA will remain  in effect (meaning this test can be used) for the duration of the COVID-19 declaration under Section 564(b)(1) of the Act, 21 U.S.C.section 360bbb-3(b)(1), unless the authorization is terminated  or revoked sooner.  Influenza A by PCR NEGATIVE NEGATIVE Final   Influenza B by PCR NEGATIVE NEGATIVE Final    Comment: (NOTE) The Xpert Xpress SARS-CoV-2/FLU/RSV plus assay is intended as an aid in the diagnosis of influenza from Nasopharyngeal swab specimens and should not be used as a sole basis for treatment. Nasal washings and aspirates are unacceptable for Xpert Xpress SARS-CoV-2/FLU/RSV testing.  Fact Sheet for Patients: EntrepreneurPulse.com.au  Fact Sheet for Healthcare Providers: IncredibleEmployment.be  This test is not yet approved or cleared by the  [place] FDA and has been authorized for detection and/or diagnosis of SARS-CoV-2 by FDA under an Emergency Use Authorization (EUA). This EUA will remain in effect (meaning this test can be used) for the duration of the COVID-19 declaration under Section 564(b)(1) of the Act, 21 U.S.C. section 360bbb-3(b)(1), unless the authorization is terminated or revoked.  Performed at [facility], [address] [place], [place] [address]      Labs: Basic Metabolic Panel: Recent Labs  Lab 06/02/21 1819 06/03/21 0533 06/04/21 0520  NA 126* 131* 133*  K 3.9 4.2 4.1  CL 91* 94* 100  CO2 24 26 25   GLUCOSE 88 133* 139*  BUN 8 10 19   CREATININE 0.78 0.86 0.78  CALCIUM 9.1 9.1 8.9  MG  --   --  1.7   Liver Function Tests: Recent Labs  Lab 06/02/21 1819 06/03/21 0533  AST 31  --   ALT 17  --   ALKPHOS 89  --   BILITOT 0.9  --   PROT 7.5 7.2  ALBUMIN 4.0  --    No results for input(s): LIPASE, AMYLASE in the last 168 hours. No results for input(s): AMMONIA in the last 168 hours. CBC: Recent Labs  Lab 06/02/21 1819  WBC 5.8  NEUTROABS 2.5  HGB 15.4  HCT 43.7  MCV 99.5  PLT 234   Cardiac Enzymes: No results for input(s): CKTOTAL, CKMB, CKMBINDEX, TROPONINI in the last 168 hours. BNP: Invalid input(s): POCBNP CBG: No results for input(s): GLUCAP in the last 168 hours.  Time coordinating discharge:  36 minutes  Signed:  Orson Eva, DO [facility] Pager: [phone] 06/04/2021, 2:21 PM

## 2021-06-04 NOTE — Progress Notes (Signed)
Calorie Count Note  48-hour calorie count ordered. Day one completed. Talked with patient this morning and observed breakfast tray-100% consumed. Patient reported no issues with meals.  Diet: Regular Supplements: Ensure Plus BID  Per nursing: Breakfast: 100% [715 kcal, 33 gr protein, 78 gr CHO] Lunch: 100% [620 kcal, 28.7 gr protein, 101 gr CHO] Dinner: no documentation Supplements: Ensure Plus 100% [350 kcal, 20 gr protein] Snacks: 5 packs graham crackers, peanut butter   Total intake: based on available information 1900 kcal (100% of minimum estimated needs)  82 protein (>100% of minimum estimated needs)  Note: day 1- expect pt met goal intake based on available information.  Estimated Nutritional Needs:    Kcal:  8372-9021   Protein:  65-70 gr   Fluid:  per MD goals (normal needs>1600 ml daily)  Nutrition Dx: Predicted suboptimal nutrient intake related to social / environmental circumstances, acute illness (tobacco/etoh use, dyspnea) as evidenced by  patient history and COPD exacerbation.   Goal: Pt to meet >/= 90% of their estimated nutrition needs    Intervention: Ensure Plus High Protein po BID, each supplement provides 350 kcal and 20 grams of protein.   Colman Cater MS,RD,CSG,LDN Office: [phone] Contact: Shea Evans

## 2022-03-30 IMAGING — DX DG CHEST 1V PORT
1 series · 1 of 1 positions shown · non-contrast
Comparison: Chest x-ray 05/20/2020.

CLINICAL DATA: 60-year-old male with history of cough, shortness of
breath and fever with diarrhea.

EXAM:
PORTABLE CHEST 1 VIEW

[chest ap]
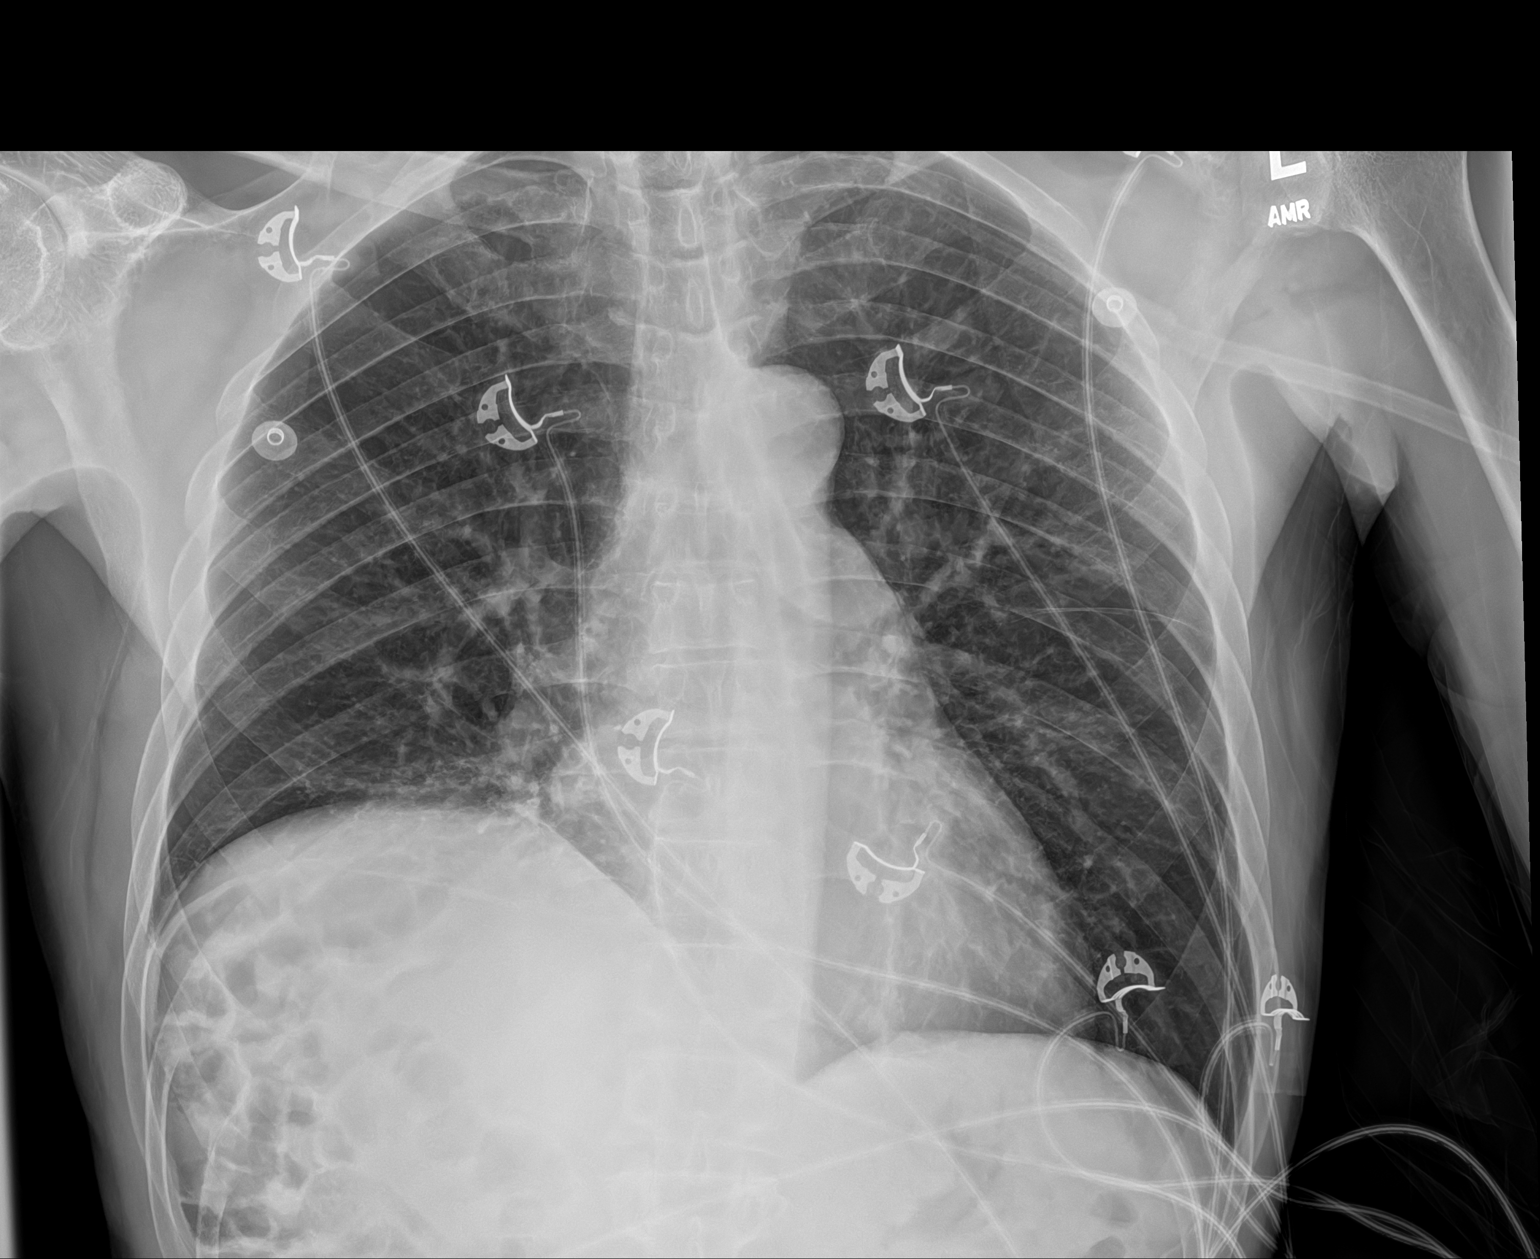

[1 of 1 positions shown; findings below may reference images not displayed]

FINDINGS: Elevation of the right hemidiaphragm. Lung volumes are normal. No
consolidative airspace disease. No pleural effusions. No
pneumothorax. No pulmonary nodule or mass noted. Pulmonary
vasculature and the cardiomediastinal silhouette are within normal
limits.
IMPRESSION: 1.  No radiographic evidence of acute cardiopulmonary disease.
2. Elevation of the right hemidiaphragm.

## 2022-04-03 ENCOUNTER — Other Ambulatory Visit: Payer: Self-pay

## 2022-04-03 ENCOUNTER — Observation Stay (HOSPITAL_COMMUNITY)
Admission: EM | Admit: 2022-04-03 | Discharge: 2022-04-04 | Disposition: A | Discharge status: Home / Self Care | Payer: Medicaid Other | Attending: Family Medicine | Admitting: Family Medicine

## 2022-04-03 ENCOUNTER — Emergency Department (HOSPITAL_COMMUNITY): Payer: Medicaid Other

## 2022-04-03 ENCOUNTER — Encounter (HOSPITAL_COMMUNITY): Payer: Self-pay | Admitting: Emergency Medicine

## 2022-04-03 DIAGNOSIS — Z79899 Other long term (current) drug therapy: Secondary | ICD-10-CM | POA: Diagnosis not present

## 2022-04-03 DIAGNOSIS — E871 Hypo-osmolality and hyponatremia: Secondary | ICD-10-CM | POA: Diagnosis present

## 2022-04-03 DIAGNOSIS — J9601 Acute respiratory failure with hypoxia: Secondary | ICD-10-CM | POA: Insufficient documentation

## 2022-04-03 DIAGNOSIS — Z1152 Encounter for screening for COVID-19: Secondary | ICD-10-CM | POA: Insufficient documentation

## 2022-04-03 DIAGNOSIS — F17210 Nicotine dependence, cigarettes, uncomplicated: Secondary | ICD-10-CM | POA: Diagnosis not present

## 2022-04-03 DIAGNOSIS — F1010 Alcohol abuse, uncomplicated: Secondary | ICD-10-CM | POA: Diagnosis present

## 2022-04-03 DIAGNOSIS — Z7951 Long term (current) use of inhaled steroids: Secondary | ICD-10-CM | POA: Insufficient documentation

## 2022-04-03 DIAGNOSIS — I10 Essential (primary) hypertension: Secondary | ICD-10-CM | POA: Diagnosis present

## 2022-04-03 DIAGNOSIS — R0602 Shortness of breath: Secondary | ICD-10-CM | POA: Diagnosis present

## 2022-04-03 DIAGNOSIS — J45909 Unspecified asthma, uncomplicated: Secondary | ICD-10-CM | POA: Diagnosis not present

## 2022-04-03 DIAGNOSIS — J441 Chronic obstructive pulmonary disease with (acute) exacerbation: Principal | ICD-10-CM | POA: Diagnosis present

## 2022-04-03 DIAGNOSIS — Z720 Tobacco use: Secondary | ICD-10-CM | POA: Diagnosis present

## 2022-04-03 LAB — CBC WITH DIFFERENTIAL/PLATELET
Abs Immature Granulocytes: 0.02 10*3/uL (ref 0.00–0.07)
Basophils Absolute: 0 10*3/uL (ref 0.0–0.1)
Basophils Relative: 1 %
Eosinophils Absolute: 0.4 10*3/uL (ref 0.0–0.5)
Eosinophils Relative: 7 %
HCT: 43.8 % (ref 39.0–52.0)
Hemoglobin: 15.8 g/dL (ref 13.0–17.0)
Immature Granulocytes: 0 %
Lymphocytes Relative: 26 %
Lymphs Abs: 1.6 10*3/uL (ref 0.7–4.0)
MCH: 36.1 pg — ABNORMAL HIGH (ref 26.0–34.0)
MCHC: 36.1 g/dL — ABNORMAL HIGH (ref 30.0–36.0)
MCV: 100 fL (ref 80.0–100.0)
Monocytes Absolute: 0.6 10*3/uL (ref 0.1–1.0)
Monocytes Relative: 9 %
Neutro Abs: 3.4 10*3/uL (ref 1.7–7.7)
Neutrophils Relative %: 57 %
Platelets: 214 10*3/uL (ref 150–400)
RBC: 4.38 MIL/uL (ref 4.22–5.81)
RDW: 11.3 % — ABNORMAL LOW (ref 11.5–15.5)
WBC: 6.1 10*3/uL (ref 4.0–10.5)
nRBC: 0 % (ref 0.0–0.2)

## 2022-04-03 LAB — BASIC METABOLIC PANEL
Anion gap: 13 (ref 5–15)
BUN: 5 mg/dL — ABNORMAL LOW (ref 8–23)
CO2: 24 mmol/L (ref 22–32)
Calcium: 9.1 mg/dL (ref 8.9–10.3)
Chloride: 86 mmol/L — ABNORMAL LOW (ref 98–111)
Creatinine, Ser: 0.75 mg/dL (ref 0.61–1.24)
GFR, Estimated: >60 mL/min (ref >60)
Glucose, Bld: 83 mg/dL (ref 70–99)
Potassium: 3.8 mmol/L (ref 3.5–5.1)
Sodium: 123 mmol/L — ABNORMAL LOW (ref 135–145)

## 2022-04-03 LAB — RESP PANEL BY RT-PCR (FLU A&B, COVID) ARPGX2
Influenza A by PCR: NEGATIVE
Influenza B by PCR: NEGATIVE
SARS Coronavirus 2 by RT PCR: NEGATIVE

## 2022-04-03 LAB — MAGNESIUM: Magnesium: 1.7 mg/dL (ref 1.7–2.4)

## 2022-04-03 LAB — PHOSPHORUS: Phosphorus: 2.7 mg/dL (ref 2.5–4.6)

## 2022-04-03 MED ORDER — SODIUM CHLORIDE 0.9 % IV SOLN
1.0000 g | INTRAVENOUS | Status: DC
  Administered 2022-04-04: 1 g via INTRAVENOUS
  Filled 2022-04-03: qty 10

## 2022-04-03 MED ORDER — NICOTINE 7 MG/24HR TD PT24
7.0000 mg | MEDICATED_PATCH | Freq: Every day | TRANSDERMAL | Status: DC
  Administered 2022-04-03 – 2022-04-04 (×2): 7 mg via TRANSDERMAL
  Filled 2022-04-03 (×2): qty 1

## 2022-04-03 MED ORDER — LORAZEPAM 1 MG PO TABS: 1.0000 mg | ORAL_TABLET | ORAL | Status: DC | PRN

## 2022-04-03 MED ORDER — PREDNISONE 20 MG PO TABS: 40.0000 mg | ORAL_TABLET | Freq: Every day | ORAL | Status: DC

## 2022-04-03 MED ORDER — SODIUM CHLORIDE 0.9 % IV BOLUS
500.0000 mL | Freq: Once | INTRAVENOUS | Status: AC
  Administered 2022-04-03: 500 mL via INTRAVENOUS

## 2022-04-03 MED ORDER — ALBUTEROL SULFATE HFA 108 (90 BASE) MCG/ACT IN AERS
2.0000 | INHALATION_SPRAY | RESPIRATORY_TRACT | Status: DC | PRN
  Administered 2022-04-03: 2 via RESPIRATORY_TRACT
  Filled 2022-04-03: qty 6.7

## 2022-04-03 MED ORDER — PANTOPRAZOLE SODIUM 40 MG PO TBEC
40.0000 mg | DELAYED_RELEASE_TABLET | Freq: Every day | ORAL | Status: DC
  Administered 2022-04-04: 40 mg via ORAL
  Filled 2022-04-03: qty 1

## 2022-04-03 MED ORDER — SODIUM CHLORIDE 0.9 % IV SOLN
1.0000 g | Freq: Once | INTRAVENOUS | Status: AC
  Administered 2022-04-03: 1 g via INTRAVENOUS
  Filled 2022-04-03: qty 10

## 2022-04-03 MED ORDER — SODIUM CHLORIDE 0.9 % IV SOLN: 1.0000 g | INTRAVENOUS | Status: DC

## 2022-04-03 MED ORDER — SODIUM CHLORIDE 0.9 % IV SOLN: INTRAVENOUS | Status: DC

## 2022-04-03 MED ORDER — THIAMINE MONONITRATE 100 MG PO TABS
100.0000 mg | ORAL_TABLET | Freq: Every day | ORAL | Status: DC
  Administered 2022-04-03 – 2022-04-04 (×2): 100 mg via ORAL
  Filled 2022-04-03 (×2): qty 1

## 2022-04-03 MED ORDER — ENOXAPARIN SODIUM 40 MG/0.4ML IJ SOSY
40.0000 mg | PREFILLED_SYRINGE | INTRAMUSCULAR | Status: DC
  Administered 2022-04-03: 40 mg via SUBCUTANEOUS
  Filled 2022-04-03: qty 0.4

## 2022-04-03 MED ORDER — FOLIC ACID 1 MG PO TABS
1.0000 mg | ORAL_TABLET | Freq: Every day | ORAL | Status: DC
  Administered 2022-04-03 – 2022-04-04 (×2): 1 mg via ORAL
  Filled 2022-04-03 (×2): qty 1

## 2022-04-03 MED ORDER — POLYETHYLENE GLYCOL 3350 17 G PO PACK: 17.0000 g | PACK | Freq: Every day | ORAL | Status: DC | PRN

## 2022-04-03 MED ORDER — MOMETASONE FURO-FORMOTEROL FUM 200-5 MCG/ACT IN AERO
2.0000 | INHALATION_SPRAY | Freq: Two times a day (BID) | RESPIRATORY_TRACT | Status: DC
  Administered 2022-04-03 – 2022-04-04 (×2): 2 via RESPIRATORY_TRACT
  Filled 2022-04-03: qty 8.8

## 2022-04-03 MED ORDER — ACETAMINOPHEN 325 MG PO TABS: 650.0000 mg | ORAL_TABLET | Freq: Four times a day (QID) | ORAL | Status: DC | PRN

## 2022-04-03 MED ORDER — ADULT MULTIVITAMIN W/MINERALS CH
1.0000 | ORAL_TABLET | Freq: Every day | ORAL | Status: DC
  Administered 2022-04-03 – 2022-04-04 (×2): 1 via ORAL
  Filled 2022-04-03 (×2): qty 1

## 2022-04-03 MED ORDER — METHYLPREDNISOLONE SODIUM SUCC 125 MG IJ SOLR
60.0000 mg | Freq: Two times a day (BID) | INTRAMUSCULAR | Status: DC
  Administered 2022-04-04: 60 mg via INTRAVENOUS
  Filled 2022-04-03: qty 2

## 2022-04-03 MED ORDER — ACETAMINOPHEN 650 MG RE SUPP: 650.0000 mg | Freq: Four times a day (QID) | RECTAL | Status: DC | PRN

## 2022-04-03 MED ORDER — THIAMINE HCL 100 MG/ML IJ SOLN: 100.0000 mg | Freq: Every day | INTRAMUSCULAR | Status: DC

## 2022-04-03 MED ORDER — ONDANSETRON HCL 4 MG/2ML IJ SOLN: 4.0000 mg | Freq: Four times a day (QID) | INTRAMUSCULAR | Status: DC | PRN

## 2022-04-03 MED ORDER — LORAZEPAM 2 MG/ML IJ SOLN: 1.0000 mg | INTRAMUSCULAR | Status: DC | PRN

## 2022-04-03 MED ORDER — ALBUTEROL SULFATE (2.5 MG/3ML) 0.083% IN NEBU
5.0000 mg/h | INHALATION_SOLUTION | Freq: Once | RESPIRATORY_TRACT | Status: AC
  Administered 2022-04-03: 5 mg/h via RESPIRATORY_TRACT
  Filled 2022-04-03: qty 3

## 2022-04-03 MED ORDER — ONDANSETRON HCL 4 MG PO TABS: 4.0000 mg | ORAL_TABLET | Freq: Four times a day (QID) | ORAL | Status: DC | PRN

## 2022-04-03 MED ORDER — DEXAMETHASONE SODIUM PHOSPHATE 10 MG/ML IJ SOLN
10.0000 mg | Freq: Once | INTRAMUSCULAR | Status: AC
  Administered 2022-04-03: 10 mg via INTRAVENOUS
  Filled 2022-04-03: qty 1

## 2022-04-03 MED ORDER — ATORVASTATIN CALCIUM 10 MG PO TABS
10.0000 mg | ORAL_TABLET | Freq: Every day | ORAL | Status: DC
  Administered 2022-04-03: 10 mg via ORAL
  Filled 2022-04-03: qty 1

## 2022-04-03 MED ORDER — AMLODIPINE BESYLATE 5 MG PO TABS
10.0000 mg | ORAL_TABLET | Freq: Every day | ORAL | Status: DC
  Administered 2022-04-04: 10 mg via ORAL
  Filled 2022-04-03: qty 2

## 2022-04-03 NOTE — Assessment & Plan Note (Addendum)
Na- 123.  History of hyponatremia, over the past 2 years, sodium has ranged from 121-133.  Last checked 10 months ago 06/04/2021 sodium was 133.  Likely secondary to - 542m bolus then continue N/s 100cc/hr  -Obtain urine sodium, urine osmolality, urine osmolality

## 2022-04-03 NOTE — Assessment & Plan Note (Addendum)
Drinks sixpack of beer on average daily.  Drunk 1 or 2 beers this morning, his last drink was about 7 AM this morning. - CIWA as needed  -Thiamine folate multivitamins -Check mag,, Phos

## 2022-04-03 NOTE — ED Provider Notes (Signed)
[facility] EMERGENCY DEPARTMENT Provider Note   CSN: [id number] Arrival date & time: 04/03/22  1241     History Chief Complaint  Patient presents with   Shortness of Breath    Jesus Walker is a 61 y.o. male with history of COPD who presents to the emergency department today for further evaluation of shortness of breath.  Patient does not typically have oxygen at home.  He states that he has been short of breath for the last few days.  Does report associated cough and chest tightness.  Cough is productive with yellow sputum.  He states the sputum is typically white this is a new change. No fever or chills.   Shortness of Breath      Home Medications Prior to Admission medications   Medication Sig Start Date End Date Taking? Authorizing Provider  acetaminophen (TYLENOL) 500 MG tablet Take 500 mg by mouth every 6 (six) hours as needed.   Yes [provider]  albuterol (PROVENTIL) (2.5 MG/3ML) 0.083% nebulizer solution Take 2.5 mg by nebulization 4 (four) times daily. 05/31/21  Yes [provider]  amLODipine (NORVASC) 10 MG tablet Take 10 mg by mouth daily. 05/31/21  Yes [provider]  atorvastatin (LIPITOR) 10 MG tablet Take 10 mg by mouth daily. 03/04/22  Yes [provider]  budesonide-formoterol (SYMBICORT) 160-4.5 MCG/ACT inhaler Inhale 2 puffs into the lungs 2 (two) times daily. 06/14/18  Yes Barton Dubois, MD  folic acid (FOLVITE) 1 MG tablet Take 1 mg by mouth daily. 05/01/20  Yes [provider]  omeprazole (PRILOSEC) 20 MG capsule Take 20 mg by mouth daily.   Yes [provider]  PROAIR HFA 108 (90 Base) MCG/ACT inhaler Inhale 2 puffs into the lungs 4 (four) times daily. 12/26/19  Yes [provider]  predniSONE (DELTASONE) 50 MG tablet Take 1 tablet (50 mg total) by mouth daily with breakfast. Patient not taking: Reported on 04/03/2022 06/05/21   Orson Eva, MD      Allergies    Patient has no known  allergies.    Review of Systems   Review of Systems  Respiratory:  Positive for shortness of breath.   All other systems reviewed and are negative.   Physical Exam Updated Vital Signs BP 137/71   Pulse (!) 105   Temp 98 F (36.7 C) (Oral)   Resp 19   SpO2 96%  Physical Exam Vitals and nursing note reviewed.  Constitutional:      General: He is not in acute distress.    Appearance: Normal appearance.  HENT:     Head: Normocephalic and atraumatic.  Eyes:     General:        Right eye: No discharge.        Left eye: No discharge.  Cardiovascular:     Comments: Regular rate and rhythm.  S1/S2 are distinct without any evidence of murmur, rubs, or gallops.  Radial pulses are 2+ bilaterally.  Dorsalis pedis pulses are 2+ bilaterally.  No evidence of pedal edema. Pulmonary:     Effort: Tachypnea present.     Breath sounds: Decreased breath sounds and wheezing present.  Abdominal:     General: Abdomen is flat. Bowel sounds are normal. There is no distension.     Tenderness: There is no abdominal tenderness. There is no guarding or rebound.  Musculoskeletal:        General: Normal range of motion.     Cervical back: Neck supple.  Skin:  General: Skin is warm and dry.     Findings: No rash.  Neurological:     General: No focal deficit present.     Mental Status: He is alert.  Psychiatric:        Mood and Affect: Mood normal.        Behavior: Behavior normal.     ED Results / Procedures / Treatments   Labs (all labs ordered are listed, but only abnormal results are displayed) Labs Reviewed  CBC WITH DIFFERENTIAL/PLATELET - Abnormal; Notable for the following components:      Result Value   MCH 36.1 (*)    MCHC 36.1 (*)    RDW 11.3 (*)    All other components within normal limits  BASIC METABOLIC PANEL - Abnormal; Notable for the following components:   Sodium 123 (*)    Chloride 86 (*)    BUN 5 (*)    All other components within normal limits  RESP PANEL BY  RT-PCR (FLU A&B, COVID) ARPGX2    EKG None  Radiology DG Chest Port 1 View  Result Date: 04/03/2022 CLINICAL DATA:  Dyspnea and LEFT mid back pain for 2 days, labored breathing at rest, audible wheezing EXAM: PORTABLE CHEST 1 VIEW COMPARISON:  Portable exam 1347 hours compared to 06/02/2021 FINDINGS: Normal heart size, mediastinal contours, and pulmonary vascularity. Atherosclerotic calcification aorta. Chronic elevation RIGHT diaphragm with chronic RIGHT basilar atelectasis. Remaining lungs clear. No acute infiltrate, pleural effusion, or pneumothorax. IMPRESSION: Chronic elevation of RIGHT diaphragm with chronic RIGHT basilar atelectasis. Electronically Signed   By: Lavonia Dana M.D.   On: 04/03/2022 13:59    Procedures Procedures      Medications Ordered in ED Medications  albuterol (VENTOLIN HFA) 108 (90 Base) MCG/ACT inhaler 2 puff (2 puffs Inhalation Given 04/03/22 1305)  dexamethasone (DECADRON) injection 10 mg (10 mg Intravenous Given 04/03/22 1329)  albuterol (PROVENTIL) (2.5 MG/3ML) 0.083% nebulizer solution (5 mg/hr Nebulization Given 04/03/22 1350)  cefTRIAXone (ROCEPHIN) 1 g in sodium chloride 0.9 % 100 mL IVPB (1 g Intravenous New Bag/Given 04/03/22 1534)    ED Course/ Medical Decision Making/ A&P Clinical Course as of 04/03/22 1643  Thu Apr 03, 2022  1642 Spoke with Dr. Denton Brick with [facility] who agrees to admit the patient.  [CF]  7169 Basic metabolic panel(!) There is evidence of hyponatremia and hypochloremia.  Kidney function is normal. [CF]  1642 CBC with Differential(!) Leukocytosis. [CF]  1642 Resp Panel by RT-PCR (Flu A&B, Covid) Anterior Nasal Swab In process. [CF]  1642 DG Chest Port 1 View Signs of pneumonia. [CF]    Clinical Course User Index [CF] Hendricks Limes, PA-C                           Medical Decision Making Jesus Walker is a 60 y.o. male patient who presents to the emergency department today for further evaluation of shortness  of breath is been ongoing for last couple of days.  Patient does appear to be in mild respiratory distress.  He is tachypneic and has an oxygen saturation of 97% on 3 L.  He typically does not have oxygen.  Patient does carry a history of COPD.  Could be superimposed infection as well.  We will get x-ray, labs, and plan to get a respiratory panel.  Patient got a nebulizer treatment and did have improvement in air movement although he still slightly tachypneic.  Upon ambulating the patient, he  immediately dropped to 87-88% and became quite tachypneic.  Upon getting him back in the bed he was immediately placed on 3 L and went up to 93%.  Revealing old records the patient does, however in the low 90s given his COPD history but does not typically need supplemental oxygen that I can find.  Given the clinical scenario, I do feel the patient would likely benefit from further evaluation in the hospital for further management and antibiotic treatment.  I will work on getting admitted to the hospital service.   Amount and/or Complexity of Data Reviewed Labs: ordered. Decision-making details documented in ED Course. Radiology: ordered. Decision-making details documented in ED Course.  Risk Prescription drug management. Decision regarding hospitalization.    Final Clinical Impression(s) / ED Diagnoses Final diagnoses:  COPD exacerbation [facility])    Rx / [place] Orders ED Discharge Orders     None         Hendricks Limes, [place] 04/03/22 1643    Davonna Belling, MD 04/04/22 [phone]

## 2022-04-03 NOTE — Assessment & Plan Note (Signed)
Systolic 868H to 488N. -Resume Norvasc

## 2022-04-03 NOTE — Assessment & Plan Note (Addendum)
Respiratory failure ruled out.  Dyspnea, productive cough, wheezing.  Ongoing tobacco abuse.  Not on home O2.  COPD exacerbation with hypoxia with exertion.  Chest x-ray chronic right base atelectasis.  COVID and influenza test negative. -DuoNebs as needed and scheduled -Dexamethasone 10 mg x 1 given, continue with Solu-Medrol 60 twice daily in a.m. -IV ceftriaxone given in ED, will continue -Flutter valve, mucolytic's -Patient reports urinary symptoms of dysuria, will check UA,

## 2022-04-03 NOTE — H&P (Signed)
History and Physical    Jesus Walker UTM:[id number] DOB: 1960/11/12 DOA: 04/03/2022  PCP: Carrolyn Meiers, MD   Patient coming from: Home  I have personally briefly reviewed patient's old medical records in [place]  Chief Complaint: Difficulty breathing  HPI: Jesus Walker is a 61 y.o. male with medical history significant for COPD, ongoing tobacco abuse, lung mass, alcohol abuse. Patient presented to the ED with complaints of difficulty breathing, of 2 days duration, he also reports cough productive of yellowish sputum and wheezing.  He reports chest tightness from not being able to breathe well but denies chest pain.  No leg swelling.  He is not on home O2.  He also reports some pain with urination that started yesterday, but improved today. He smokes half a pack of cigarettes daily.  ED Course: O2 sats 90% on room air, drops to 88 to 89% with ambulation.  Temperature 98.  Heart rate 90-111.  Respiratory rate 18-27.  Blood pressure 120s to 170s.  Sodium 123.  Chest x-ray shows persistent right base atelectasis.  COVID test negative. Albuterol nebs given, ceftriaxone 1 g started, dexamethasone 10 mg x 1 given.  Hospitalist admit for COPD exacerbation  Review of Systems: As per HPI all other systems reviewed and negative.  Past Medical History:  Diagnosis Date   Alcohol dependence ([place])    Asthma    COPD (chronic obstructive pulmonary disease) ([place])    GERD (gastroesophageal reflux disease)    Hypertension     Past Surgical History:  Procedure Laterality Date   BRONCHIAL BRUSHINGS  01/03/2016   Procedure: BRONCHIAL BRUSHINGS;  Surgeon: Sinda Du, MD;  Location: [facility];  Service: Cardiopulmonary;;   BRONCHIAL WASHINGS  01/03/2016   Procedure: BRONCHIAL WASHINGS;  Surgeon: Sinda Du, MD;  Location: [facility];  Service: Cardiopulmonary;;   FLEXIBLE BRONCHOSCOPY N/A 01/03/2016   Procedure: FLEXIBLE BRONCHOSCOPY;  Surgeon: Sinda Du, MD;   Location: [facility];  Service: Cardiopulmonary;  Laterality: N/A;     reports that he has been smoking cigarettes. He has been smoking an average of 1 pack per day. He has never used smokeless tobacco. He reports current alcohol use of about 12.0 standard drinks of alcohol per week. He reports that he does not use drugs.  No Known Allergies  Family History  Problem Relation Age of Onset   Asthma Mother 39   Asthma Father 34    Prior to Admission medications   Medication Sig Start Date End Date Taking? Authorizing Provider  acetaminophen (TYLENOL) 500 MG tablet Take 500 mg by mouth every 6 (six) hours as needed.   Yes [provider]  albuterol (PROVENTIL) (2.5 MG/3ML) 0.083% nebulizer solution Take 2.5 mg by nebulization 4 (four) times daily. 05/31/21  Yes [provider]  amLODipine (NORVASC) 10 MG tablet Take 10 mg by mouth daily. 05/31/21  Yes [provider]  atorvastatin (LIPITOR) 10 MG tablet Take 10 mg by mouth daily. 03/04/22  Yes [provider]  budesonide-formoterol (SYMBICORT) 160-4.5 MCG/ACT inhaler Inhale 2 puffs into the lungs 2 (two) times daily. 06/14/18  Yes Barton Dubois, MD  folic acid (FOLVITE) 1 MG tablet Take 1 mg by mouth daily. 05/01/20  Yes [provider]  omeprazole (PRILOSEC) 20 MG capsule Take 20 mg by mouth daily.   Yes [provider]  PROAIR HFA 108 (90 Base) MCG/ACT inhaler Inhale 2 puffs into the lungs 4 (four) times daily. 12/26/19  Yes [provider]  predniSONE (DELTASONE) 50 MG tablet Take 1 tablet (50 mg total) by mouth daily with breakfast. Patient not taking: Reported on 04/03/2022 06/05/21   Orson Eva, MD    Physical Exam: Vitals:   04/03/22 1430 04/03/22 1500 04/03/22 1530 04/03/22 1630  BP: (!) 150/67 (!) 144/73 137/71 122/84  Pulse: 97 (!) 102 (!) 105 (!) 111  Resp: 19 (!) '22 19 20  '$ Temp:    98.3 F (36.8 C)  TempSrc:    Oral  SpO2: 99% 97% 96% 97%    Constitutional:  Appears disheveled, calm, comfortable Vitals:   04/03/22 1430 04/03/22 1500 04/03/22 1530 04/03/22 1630  BP: (!) 150/67 (!) 144/73 137/71 122/84  Pulse: 97 (!) 102 (!) 105 (!) 111  Resp: 19 (!) '22 19 20  '$ Temp:    98.3 F (36.8 C)  TempSrc:    Oral  SpO2: 99% 97% 96% 97%   Eyes: PERRL, lids and conjunctivae normal ENMT: Mucous membranes are moist.  Neck: normal, supple, no masses, no thyromegaly Respiratory: Diffuse expiratory wheezing,  Normal respiratory effort. No accessory muscle use.  Cardiovascular: Regular rate and rhythm, no murmurs / rubs / gallops. No extremity edema.  Extremities warm Abdomen: no tenderness, no masses palpated. No hepatosplenomegaly. Bowel sounds positive.  Musculoskeletal: no clubbing / cyanosis. No joint deformity upper and lower extremities.  Skin: no rashes, lesions, ulcers. No induration Neurologic: No apparent cranial nerve abnormality, moving extremities spontaneously Psychiatric: Normal judgment and insight. Alert and oriented x 3. Normal mood.   Labs on Admission: I have personally reviewed following labs and imaging studies  CBC: Recent Labs  Lab 04/03/22 1338  WBC 6.1  NEUTROABS 3.4  HGB 15.8  HCT 43.8  MCV 100.0  PLT 244   Basic Metabolic Panel: Recent Labs  Lab 04/03/22 1338  NA 123*  K 3.8  CL 86*  CO2 24  GLUCOSE 83  BUN 5*  CREATININE 0.75  CALCIUM 9.1   Radiological Exams on Admission: DG Chest Port 1 View  Result Date: 04/03/2022 CLINICAL DATA:  Dyspnea and LEFT mid back pain for 2 days, labored breathing at rest, audible wheezing EXAM: PORTABLE CHEST 1 VIEW COMPARISON:  Portable exam 1347 hours compared to 06/02/2021 FINDINGS: Normal heart size, mediastinal contours, and pulmonary vascularity. Atherosclerotic calcification aorta. Chronic elevation RIGHT diaphragm with chronic RIGHT basilar atelectasis. Remaining lungs clear. No acute infiltrate, pleural effusion, or pneumothorax. IMPRESSION: Chronic elevation of RIGHT  diaphragm with chronic RIGHT basilar atelectasis. Electronically Signed   By: Lavonia Dana M.D.   On: 04/03/2022 13:59    EKG: Independently reviewed.  Sinus rhythm, rate 97, QTc 456.  No significant ST or T wave changes from prior.  Assessment/Plan Principal Problem:   COPD exacerbation (HCC) Active Problems:   Acute respiratory failure with hypoxia (HCC)   Hyponatremia   Tobacco abuse   HTN (hypertension)   Alcohol abuse    Assessment and Plan: * COPD exacerbation (HCC) Dyspnea, productive cough, wheezing.  Ongoing tobacco abuse.  Not on home O2.  COPD exacerbation with hypoxia with exertion.  Chest x-ray chronic right base atelectasis.  COVID and influenza test negative. -DuoNebs as needed and scheduled -Dexamethasone 10 mg x 1 given, continue with Solu-Medrol 60 twice daily in a.m. -IV ceftriaxone given in ED, will continue -Flutter valve, mucolytic's -Patient reports urinary symptoms of dysuria, will check UA,  Acute respiratory failure with hypoxia (HCC) O2 sats dropped to 87 to 88% with ambulation.  Likely due to COPD exacerbation.  Hyponatremia Na-  123.  History of hyponatremia, over the past 2 years, sodium has ranged from 121-133.  Last checked 10 months ago 06/04/2021 sodium was 133.  Likely secondary to - 548m bolus then continue N/s 100cc/hr  -Obtain urine sodium, urine osmolality, urine osmolality  Alcohol abuse Drinks sixpack of beer on average daily.  Drunk 1 or 2 beers this morning, his last drink was about 7 AM this morning. - CIWA as needed  -Thiamine folate multivitamins -Check mag,, Phos  HTN (hypertension) Systolic 1921Jto 1941D -Resume Norvasc  Tobacco abuse Ongoing tobacco abuse.  Smokes half a pack of cigarettes daily. -Nicotine patch -Extensively counseled on quitting tobacco abuse   DVT prophylaxis:  Lovenox Code Status: Full Family Communication: None at bedside Disposition Plan: ~ 2 days Consults called: None Admission status:  obs tele    EBethena RoysMD [facility]  04/03/2022, 6:14 PM    For on call review [email or web address]

## 2022-04-03 NOTE — Assessment & Plan Note (Signed)
O2 sats dropped to 87 to 88% with ambulation.  Likely due to COPD exacerbation.

## 2022-04-03 NOTE — Assessment & Plan Note (Signed)
Ongoing tobacco abuse.  Smokes half a pack of cigarettes daily. -Nicotine patch -Extensively counseled on quitting tobacco abuse

## 2022-04-03 NOTE — ED Triage Notes (Signed)
Pt c/o sob and left mid back pain x 2 days. Has been taking neb tx's at home. Mild labored breathing with resting. Audible wheezing noted. A/o. Color wnl

## 2022-04-04 DIAGNOSIS — J441 Chronic obstructive pulmonary disease with (acute) exacerbation: Secondary | ICD-10-CM | POA: Diagnosis not present

## 2022-04-04 LAB — BASIC METABOLIC PANEL
Anion gap: 9 (ref 5–15)
BUN: 12 mg/dL (ref 8–23)
CO2: 24 mmol/L (ref 22–32)
Calcium: 8.8 mg/dL — ABNORMAL LOW (ref 8.9–10.3)
Chloride: 97 mmol/L — ABNORMAL LOW (ref 98–111)
Creatinine, Ser: 0.82 mg/dL (ref 0.61–1.24)
GFR, Estimated: >60 mL/min (ref >60)
Glucose, Bld: 154 mg/dL — ABNORMAL HIGH (ref 70–99)
Potassium: 4.2 mmol/L (ref 3.5–5.1)
Sodium: 130 mmol/L — ABNORMAL LOW (ref 135–145)

## 2022-04-04 MED ORDER — ALBUTEROL SULFATE (2.5 MG/3ML) 0.083% IN NEBU: 2.5000 mg | INHALATION_SOLUTION | Freq: Four times a day (QID) | RESPIRATORY_TRACT | 12 refills | Status: DC

## 2022-04-04 MED ORDER — AZITHROMYCIN 250 MG PO TABS: ORAL_TABLET | ORAL | 0 refills | Status: AC

## 2022-04-04 MED ORDER — PREDNISONE 20 MG PO TABS: 40.0000 mg | ORAL_TABLET | Freq: Every day | ORAL | 0 refills | Status: AC

## 2022-04-04 NOTE — Progress Notes (Signed)
SATURATION QUALIFICATIONS: (This note is used to comply with regulatory documentation for home oxygen)  Patient Saturations on Room Air at Rest = 95%  Patient Saturations on Room Air while Ambulating = 90%  Please briefly explain why patient needs home oxygen: Patient ambulated in hallway, oxygen saturation dropped to 90% on room air. Patient not placed on oxygen due to oxygen saturation decreasing to 90% then increasing back up to 95% while ambulating. Patient reported no complaints of shortness of breath. MD [place] made aware.

## 2022-04-04 NOTE — Discharge Summary (Signed)
Physician Discharge Summary   Patient: Jesus Walker MRN: [id number] DOB: 06/23/1960  Admit date:     04/03/2022  Discharge date: 04/04/22  Discharge Physician: Edwin Dada   PCP: Carrolyn Meiers, MD     Recommendations at discharge:  Follow up with PCP Dr. Legrand Rams in 1 week Dr. Legrand Rams: Please check BMP in 1 week for Na (130 at discharge)     Discharge Diagnoses: Principal Problem:   COPD exacerbation ([place]) Active Problems:   Hyponatremia   Tobacco abuse   HTN (hypertension)   Alcohol abuse      Hospital Course: Jesus Walker is a 61 y.o. M with COPD, HTN, smoking, and alcohol dependence who presented with cough and wheezing for few days.  In the ER, his O2 saturation dropped with ambulation.  CXR with atelectasis and wheezing on exam.  COVID-.     * COPD exacerbation ([place]) Respiratory failure ruled out.   The patient was treated with steroids, bronchodilators, and antibiotics overnight.  In the morning he was able to ambulate with nursing without significant symptoms, without desaturation.  He was discharged with 4 more days prednisone, 5 days antibiotics, and refills of his bronchodilators.    Recommended PCP follow-up.     Hyponatremia Sodium 123 on admission.  Has chronic hyponatremia in the setting of alcohol use and chronic lung disease.  Sodium improved to 130 overnight with fluids.  Repeat BMP in 1 week with PCP.  Recommend alcohol cessation.     Alcohol abuse No evidence of alcohol withdrawal  Tobacco abuse Cessation recommended.            The [facility] Controlled Substances Registry was reviewed for this patient prior to discharge.  Consultants: None Procedures performed: None  Disposition: Home Diet recommendation:  Discharge Diet Orders (From admission, onward)     Start     Ordered   04/04/22 0000  Diet - low sodium heart healthy        04/04/22 1035             DISCHARGE MEDICATION: Allergies as  of 04/04/2022   No Known Allergies      Medication List     TAKE these medications    acetaminophen 500 MG tablet Commonly known as: TYLENOL Take 500 mg by mouth every 6 (six) hours as needed.   amLODipine 10 MG tablet Commonly known as: NORVASC Take 10 mg by mouth daily.   atorvastatin 10 MG tablet Commonly known as: LIPITOR Take 10 mg by mouth daily.   azithromycin 250 MG tablet Commonly known as: Zithromax Z-Pak Take 2 tablets (500 mg) on  Day 1,  followed by 1 tablet (250 mg) once daily on Days 2 through 5.   budesonide-formoterol 160-4.5 MCG/ACT inhaler Commonly known as: Symbicort Inhale 2 puffs into the lungs 2 (two) times daily.   folic acid 1 MG tablet Commonly known as: FOLVITE Take 1 mg by mouth daily.   omeprazole 20 MG capsule Commonly known as: PRILOSEC Take 20 mg by mouth daily.   predniSONE 20 MG tablet Commonly known as: DELTASONE Take 2 tablets (40 mg total) by mouth daily with breakfast for 4 days. Start taking on: April 05, 2022   ProAir HFA 108 (90 Base) MCG/ACT inhaler Generic drug: albuterol Inhale 2 puffs into the lungs 4 (four) times daily.   albuterol (2.5 MG/3ML) 0.083% nebulizer solution Commonly known as: PROVENTIL Take 3 mLs (2.5 mg total) by nebulization 4 (four) times daily.  Follow-up Information     Fanta, Normajean Baxter, MD. Schedule an appointment as soon as possible for a visit in 1 week(s).   Specialty: Internal Medicine Contact information: [address] [address] [phone]                 Discharge Instructions     Diet - low sodium heart healthy   Complete by: As directed    Discharge instructions   Complete by: As directed    **IMPORTANT DISCHARGE INFORMATION**   From Dr. Nelva Bush were admitted for a flare of COPD You had a chest x-ray here that ruled out pneumonia  You were treated with steroids and antibiotics You should continue these for 4 more  days:  For steroids: Take prednisone 40 mg (two tabs) once daily for four more days starting tomorrow (Saturday)  For antibiotics: Take azithromycin  Start azithromycyin today with a double dose of 500 mg (two tabs) this afternoon Then tomorrow, take azithromycin 250 mg (1 tab) once daily with the prednisone for 4 more days   For the next week Take your albuterol nebulizer three times daily for the next week You may use additional doses as needed   Continue your normal home symbicort Remember, this is your maintenance inhaler (The albuterol, either in the nebulizer or in the Proair pump, is a *rescue* inhaler, only for when you have extra symptoms)    Go see Dr. Legrand Rams in 1 week  Do NOT smoke   Increase activity slowly   Complete by: As directed        Discharge Exam: Filed Weights   04/03/22 1818  Weight: 56.2 kg    General: Pt is alert, awake, not in acute distress Cardiovascular: RRR, nl S1-S2, no murmurs appreciated.   No LE edema.   Respiratory: Normal respiratory rate and rhythm.  Some coarse wheezing.  No increased respiratory effort. Speaks in full sentences. Abdominal: Abdomen soft and non-tender.  No distension or HSM.   Neuro/Psych: Strength symmetric in upper and lower extremities.  Judgment and insight appear normal.   Condition at discharge: stable  The results of significant diagnostics from this hospitalization (including imaging, microbiology, ancillary and laboratory) are listed below for reference.   Imaging Studies: DG Chest Port 1 View  Result Date: 04/03/2022 CLINICAL DATA:  Dyspnea and LEFT mid back pain for 2 days, labored breathing at rest, audible wheezing EXAM: PORTABLE CHEST 1 VIEW COMPARISON:  Portable exam 1347 hours compared to 06/02/2021 FINDINGS: Normal heart size, mediastinal contours, and pulmonary vascularity. Atherosclerotic calcification aorta. Chronic elevation RIGHT diaphragm with chronic RIGHT basilar atelectasis. Remaining  lungs clear. No acute infiltrate, pleural effusion, or pneumothorax. IMPRESSION: Chronic elevation of RIGHT diaphragm with chronic RIGHT basilar atelectasis. Electronically Signed   By: Lavonia Dana M.D.   On: 04/03/2022 13:59    Microbiology: Results for orders placed or performed during the hospital encounter of 04/03/22  Resp Panel by RT-PCR (Flu A&B, Covid) Anterior Nasal Swab     Status: None   Collection Time: 04/03/22  3:32 PM   Specimen: Anterior Nasal Swab  Result Value Ref Range Status   SARS Coronavirus 2 by RT PCR NEGATIVE NEGATIVE Final    Comment: (NOTE) SARS-CoV-2 target nucleic acids are NOT DETECTED.  The SARS-CoV-2 RNA is generally detectable in upper respiratory specimens during the acute phase of infection. The lowest concentration of SARS-CoV-2 viral copies this assay can detect is 138 copies/mL. A negative result does not preclude SARS-Cov-2  infection and should not be used as the sole basis for treatment or other patient management decisions. A negative result may occur with  improper specimen collection/handling, submission of specimen other than nasopharyngeal swab, presence of viral mutation(s) within the areas targeted by this assay, and inadequate number of viral copies(<138 copies/mL). A negative result must be combined with clinical observations, patient history, and epidemiological information. The expected result is Negative.  Fact Sheet for Patients:  EntrepreneurPulse.com.au  Fact Sheet for Healthcare Providers:  IncredibleEmployment.be  This test is no t yet approved or cleared by the [place] FDA and  has been authorized for detection and/or diagnosis of SARS-CoV-2 by FDA under an Emergency Use Authorization (EUA). This EUA will remain  in effect (meaning this test can be used) for the duration of the COVID-19 declaration under Section 564(b)(1) of the Act, 21 U.S.C.section 360bbb-3(b)(1), unless the  authorization is terminated  or revoked sooner.       Influenza A by PCR NEGATIVE NEGATIVE Final   Influenza B by PCR NEGATIVE NEGATIVE Final    Comment: (NOTE) The Xpert Xpress SARS-CoV-2/FLU/RSV plus assay is intended as an aid in the diagnosis of influenza from Nasopharyngeal swab specimens and should not be used as a sole basis for treatment. Nasal washings and aspirates are unacceptable for Xpert Xpress SARS-CoV-2/FLU/RSV testing.  Fact Sheet for Patients: EntrepreneurPulse.com.au  Fact Sheet for Healthcare Providers: IncredibleEmployment.be  This test is not yet approved or cleared by the [place] FDA and has been authorized for detection and/or diagnosis of SARS-CoV-2 by FDA under an Emergency Use Authorization (EUA). This EUA will remain in effect (meaning this test can be used) for the duration of the COVID-19 declaration under Section 564(b)(1) of the Act, 21 U.S.C. section 360bbb-3(b)(1), unless the authorization is terminated or revoked.  Performed at [facility], [address], [place], [place] [address]     Labs: CBC: Recent Labs  Lab 04/03/22 1338  WBC 6.1  NEUTROABS 3.4  HGB 15.8  HCT 43.8  MCV 100.0  PLT 415   Basic Metabolic Panel: Recent Labs  Lab 04/03/22 1338 04/03/22 1748 04/04/22 0353  NA 123*  --  130*  K 3.8  --  4.2  CL 86*  --  97*  CO2 24  --  24  GLUCOSE 83  --  154*  BUN 5*  --  12  CREATININE 0.75  --  0.82  CALCIUM 9.1  --  8.8*  MG  --  1.7  --   PHOS  --  2.7  --    Liver Function Tests: No results for input(s): "AST", "ALT", "ALKPHOS", "BILITOT", "PROT", "ALBUMIN" in the last 168 hours. CBG: No results for input(s): "GLUCAP" in the last 168 hours.  Discharge time spent: approximately 35 minutes spent on discharge counseling, evaluation of patient on day of discharge, and coordination of discharge planning with nursing, social work, pharmacy and case  management  Signed: Edwin Dada, MD [facility] 04/04/2022

## 2022-04-04 NOTE — Progress Notes (Signed)
Patient discharged home today, transported home by family. Discharge paperwork went over with patient, patient verbalized understanding. Belongings sent home with patient.  ?

## 2022-04-04 NOTE — Hospital Course (Signed)
Mr. Jesus Walker is a 61 y.o. M with COPD, HTN, smoking, and alcohol dependence who presented with cough and wheezing for few days.  In the ER, his O2 saturation dropped with ambulation.  CXR with atelectasis and wheezing on exam.  COVID-.

## 2022-04-04 NOTE — TOC Transition Note (Signed)
Transition of Care [facility]) - CM/SW Discharge Note   Patient Details  Name: Jesus Walker MRN: [id number] Date of Birth: 1961/02/12  [facility] CM/SW Contact:  Ihor Gully, LCSW Phone Number: 04/04/2022, 10:59 AM   Clinical Narrative:    Patient in observation for COPD exacerbation. TOC consulted for SA resources. Resources offered. Patient declined.    Final next level of care: Home/Self Care Barriers to Discharge: No Barriers Identified   Patient Goals and CMS Choice Patient states their goals for this hospitalization and ongoing recovery are:: return home      Discharge Placement                       Discharge Plan and Services                                     Social Determinants of Health (SDOH) Interventions     Readmission Risk Interventions     No data to display

## 2022-06-04 ENCOUNTER — Other Ambulatory Visit: Payer: Self-pay

## 2022-06-04 ENCOUNTER — Inpatient Hospital Stay (HOSPITAL_COMMUNITY)
Admission: EM | Admit: 2022-06-04 | Discharge: 2022-06-07 | DRG: 190 | Disposition: A | Discharge status: Home / Self Care | Payer: Medicaid Other | Attending: Family Medicine | Admitting: Family Medicine

## 2022-06-04 DIAGNOSIS — F17210 Nicotine dependence, cigarettes, uncomplicated: Secondary | ICD-10-CM | POA: Diagnosis present

## 2022-06-04 DIAGNOSIS — F1020 Alcohol dependence, uncomplicated: Secondary | ICD-10-CM | POA: Diagnosis present

## 2022-06-04 DIAGNOSIS — Z716 Tobacco abuse counseling: Secondary | ICD-10-CM

## 2022-06-04 DIAGNOSIS — R636 Underweight: Secondary | ICD-10-CM | POA: Diagnosis present

## 2022-06-04 DIAGNOSIS — E88A Wasting disease (syndrome) due to underlying condition: Secondary | ICD-10-CM | POA: Diagnosis present

## 2022-06-04 DIAGNOSIS — Z7141 Alcohol abuse counseling and surveillance of alcoholic: Secondary | ICD-10-CM

## 2022-06-04 DIAGNOSIS — J9601 Acute respiratory failure with hypoxia: Secondary | ICD-10-CM | POA: Diagnosis present

## 2022-06-04 DIAGNOSIS — F17200 Nicotine dependence, unspecified, uncomplicated: Secondary | ICD-10-CM | POA: Diagnosis present

## 2022-06-04 DIAGNOSIS — I10 Essential (primary) hypertension: Secondary | ICD-10-CM | POA: Diagnosis present

## 2022-06-04 DIAGNOSIS — R0789 Other chest pain: Secondary | ICD-10-CM | POA: Diagnosis present

## 2022-06-04 DIAGNOSIS — E785 Hyperlipidemia, unspecified: Secondary | ICD-10-CM | POA: Diagnosis present

## 2022-06-04 DIAGNOSIS — R17 Unspecified jaundice: Secondary | ICD-10-CM | POA: Diagnosis present

## 2022-06-04 DIAGNOSIS — Z1152 Encounter for screening for COVID-19: Secondary | ICD-10-CM

## 2022-06-04 DIAGNOSIS — Z7951 Long term (current) use of inhaled steroids: Secondary | ICD-10-CM

## 2022-06-04 DIAGNOSIS — J441 Chronic obstructive pulmonary disease with (acute) exacerbation: Principal | ICD-10-CM | POA: Diagnosis present

## 2022-06-04 DIAGNOSIS — Z6821 Body mass index (BMI) 21.0-21.9, adult: Secondary | ICD-10-CM

## 2022-06-04 DIAGNOSIS — K219 Gastro-esophageal reflux disease without esophagitis: Secondary | ICD-10-CM | POA: Diagnosis present

## 2022-06-04 DIAGNOSIS — Z79899 Other long term (current) drug therapy: Secondary | ICD-10-CM

## 2022-06-04 DIAGNOSIS — E871 Hypo-osmolality and hyponatremia: Secondary | ICD-10-CM | POA: Diagnosis present

## 2022-06-04 HISTORY — DX: Other psychoactive substance abuse, uncomplicated: F19.10

## 2022-06-04 MED ORDER — ALBUTEROL SULFATE HFA 108 (90 BASE) MCG/ACT IN AERS: 2.0000 | INHALATION_SPRAY | RESPIRATORY_TRACT | Status: DC | PRN

## 2022-06-04 NOTE — ED Triage Notes (Addendum)
Pt BIB [facility] from home with c/o SOB. Pt was 85% on RA upon their arrival. EMS gave 1 Atrovent neb, 4 Albuterol nebs, 125 solumedrol.    Hx COPD and asthma.

## 2022-06-04 NOTE — ED Notes (Signed)
Respiratory @ bedside

## 2022-06-05 ENCOUNTER — Encounter (HOSPITAL_COMMUNITY): Payer: Self-pay | Admitting: Family Medicine

## 2022-06-05 ENCOUNTER — Emergency Department (HOSPITAL_COMMUNITY): Payer: Medicaid Other

## 2022-06-05 ENCOUNTER — Other Ambulatory Visit: Payer: Self-pay

## 2022-06-05 DIAGNOSIS — I10 Essential (primary) hypertension: Secondary | ICD-10-CM

## 2022-06-05 DIAGNOSIS — K219 Gastro-esophageal reflux disease without esophagitis: Secondary | ICD-10-CM

## 2022-06-05 DIAGNOSIS — E785 Hyperlipidemia, unspecified: Secondary | ICD-10-CM | POA: Diagnosis present

## 2022-06-05 DIAGNOSIS — F1020 Alcohol dependence, uncomplicated: Secondary | ICD-10-CM | POA: Diagnosis present

## 2022-06-05 DIAGNOSIS — E88A Wasting disease (syndrome) due to underlying condition: Secondary | ICD-10-CM | POA: Diagnosis present

## 2022-06-05 DIAGNOSIS — Z6821 Body mass index (BMI) 21.0-21.9, adult: Secondary | ICD-10-CM | POA: Diagnosis not present

## 2022-06-05 DIAGNOSIS — J441 Chronic obstructive pulmonary disease with (acute) exacerbation: Secondary | ICD-10-CM | POA: Diagnosis present

## 2022-06-05 DIAGNOSIS — F1029 Alcohol dependence with unspecified alcohol-induced disorder: Secondary | ICD-10-CM | POA: Diagnosis not present

## 2022-06-05 DIAGNOSIS — Z7141 Alcohol abuse counseling and surveillance of alcoholic: Secondary | ICD-10-CM | POA: Diagnosis not present

## 2022-06-05 DIAGNOSIS — Z7951 Long term (current) use of inhaled steroids: Secondary | ICD-10-CM | POA: Diagnosis not present

## 2022-06-05 DIAGNOSIS — F17200 Nicotine dependence, unspecified, uncomplicated: Secondary | ICD-10-CM

## 2022-06-05 DIAGNOSIS — R636 Underweight: Secondary | ICD-10-CM | POA: Diagnosis present

## 2022-06-05 DIAGNOSIS — R17 Unspecified jaundice: Secondary | ICD-10-CM | POA: Diagnosis present

## 2022-06-05 DIAGNOSIS — Z1152 Encounter for screening for COVID-19: Secondary | ICD-10-CM | POA: Diagnosis not present

## 2022-06-05 DIAGNOSIS — J9601 Acute respiratory failure with hypoxia: Secondary | ICD-10-CM | POA: Diagnosis present

## 2022-06-05 DIAGNOSIS — E871 Hypo-osmolality and hyponatremia: Secondary | ICD-10-CM | POA: Diagnosis present

## 2022-06-05 DIAGNOSIS — F17210 Nicotine dependence, cigarettes, uncomplicated: Secondary | ICD-10-CM | POA: Diagnosis present

## 2022-06-05 DIAGNOSIS — R0789 Other chest pain: Secondary | ICD-10-CM | POA: Diagnosis not present

## 2022-06-05 DIAGNOSIS — Z79899 Other long term (current) drug therapy: Secondary | ICD-10-CM | POA: Diagnosis not present

## 2022-06-05 DIAGNOSIS — Z716 Tobacco abuse counseling: Secondary | ICD-10-CM | POA: Diagnosis not present

## 2022-06-05 LAB — RAPID URINE DRUG SCREEN, HOSP PERFORMED
Amphetamines: NOT DETECTED
Barbiturates: NOT DETECTED
Benzodiazepines: NOT DETECTED
Cocaine: NOT DETECTED
Opiates: NOT DETECTED
Tetrahydrocannabinol: NOT DETECTED

## 2022-06-05 LAB — CBC WITH DIFFERENTIAL/PLATELET
Abs Immature Granulocytes: 0.03 10*3/uL (ref 0.00–0.07)
Basophils Absolute: 0 10*3/uL (ref 0.0–0.1)
Basophils Relative: 0 %
Eosinophils Absolute: 0 10*3/uL (ref 0.0–0.5)
Eosinophils Relative: 0 %
HCT: 50 % (ref 39.0–52.0)
Hemoglobin: 17.7 g/dL — ABNORMAL HIGH (ref 13.0–17.0)
Immature Granulocytes: 1 %
Lymphocytes Relative: 4 %
Lymphs Abs: 0.2 10*3/uL — ABNORMAL LOW (ref 0.7–4.0)
MCH: 36.3 pg — ABNORMAL HIGH (ref 26.0–34.0)
MCHC: 35.4 g/dL (ref 30.0–36.0)
MCV: 102.5 fL — ABNORMAL HIGH (ref 80.0–100.0)
Monocytes Absolute: 0.1 10*3/uL (ref 0.1–1.0)
Monocytes Relative: 1 %
Neutro Abs: 4.5 10*3/uL (ref 1.7–7.7)
Neutrophils Relative %: 94 %
Platelets: 196 10*3/uL (ref 150–400)
RBC: 4.88 MIL/uL (ref 4.22–5.81)
RDW: 13.2 % (ref 11.5–15.5)
WBC: 4.8 10*3/uL (ref 4.0–10.5)
nRBC: 0 % (ref 0.0–0.2)

## 2022-06-05 LAB — COMPREHENSIVE METABOLIC PANEL
ALT: 24 U/L (ref 0–44)
AST: 56 U/L — ABNORMAL HIGH (ref 15–41)
Albumin: 4.2 g/dL (ref 3.5–5.0)
Alkaline Phosphatase: 175 U/L — ABNORMAL HIGH (ref 38–126)
Anion gap: 13 (ref 5–15)
BUN: 5 mg/dL — ABNORMAL LOW (ref 8–23)
CO2: 26 mmol/L (ref 22–32)
Calcium: 9 mg/dL (ref 8.9–10.3)
Chloride: 88 mmol/L — ABNORMAL LOW (ref 98–111)
Creatinine, Ser: 0.84 mg/dL (ref 0.61–1.24)
GFR, Estimated: >60 mL/min (ref >60)
Glucose, Bld: 139 mg/dL — ABNORMAL HIGH (ref 70–99)
Potassium: 4.7 mmol/L (ref 3.5–5.1)
Sodium: 127 mmol/L — ABNORMAL LOW (ref 135–145)
Total Bilirubin: 1.5 mg/dL — ABNORMAL HIGH (ref 0.3–1.2)
Total Protein: 8.3 g/dL — ABNORMAL HIGH (ref 6.5–8.1)

## 2022-06-05 LAB — CBC
HCT: 47.5 % (ref 39.0–52.0)
Hemoglobin: 16.4 g/dL (ref 13.0–17.0)
MCH: 36.1 pg — ABNORMAL HIGH (ref 26.0–34.0)
MCHC: 34.5 g/dL (ref 30.0–36.0)
MCV: 104.6 fL — ABNORMAL HIGH (ref 80.0–100.0)
Platelets: 172 10*3/uL (ref 150–400)
RBC: 4.54 MIL/uL (ref 4.22–5.81)
RDW: 13.4 % (ref 11.5–15.5)
WBC: 6.1 10*3/uL (ref 4.0–10.5)
nRBC: 0 % (ref 0.0–0.2)

## 2022-06-05 LAB — RESP PANEL BY RT-PCR (RSV, FLU A&B, COVID)  RVPGX2
Influenza A by PCR: NEGATIVE
Influenza B by PCR: NEGATIVE
Resp Syncytial Virus by PCR: NEGATIVE
SARS Coronavirus 2 by RT PCR: NEGATIVE

## 2022-06-05 LAB — BASIC METABOLIC PANEL
Anion gap: 11 (ref 5–15)
BUN: 5 mg/dL — ABNORMAL LOW (ref 8–23)
CO2: 26 mmol/L (ref 22–32)
Calcium: 8.6 mg/dL — ABNORMAL LOW (ref 8.9–10.3)
Chloride: 89 mmol/L — ABNORMAL LOW (ref 98–111)
Creatinine, Ser: 0.75 mg/dL (ref 0.61–1.24)
GFR, Estimated: >60 mL/min (ref >60)
Glucose, Bld: 101 mg/dL — ABNORMAL HIGH (ref 70–99)
Potassium: 3.4 mmol/L — ABNORMAL LOW (ref 3.5–5.1)
Sodium: 126 mmol/L — ABNORMAL LOW (ref 135–145)

## 2022-06-05 LAB — MAGNESIUM: Magnesium: 1.7 mg/dL (ref 1.7–2.4)

## 2022-06-05 LAB — HIV ANTIBODY (ROUTINE TESTING W REFLEX): HIV Screen 4th Generation wRfx: NONREACTIVE

## 2022-06-05 MED ORDER — LORAZEPAM 1 MG PO TABS
0.0000 mg | ORAL_TABLET | Freq: Four times a day (QID) | ORAL | Status: AC
  Administered 2022-06-05 (×2): 1 mg via ORAL
  Filled 2022-06-05 (×2): qty 1

## 2022-06-05 MED ORDER — SODIUM CHLORIDE 0.9 % IV SOLN
100.0000 mg | Freq: Two times a day (BID) | INTRAVENOUS | Status: DC
  Administered 2022-06-05 – 2022-06-06 (×3): 100 mg via INTRAVENOUS
  Filled 2022-06-05 (×6): qty 100

## 2022-06-05 MED ORDER — METHYLPREDNISOLONE SODIUM SUCC 125 MG IJ SOLR
80.0000 mg | Freq: Two times a day (BID) | INTRAMUSCULAR | Status: AC
  Administered 2022-06-05 – 2022-06-06 (×2): 80 mg via INTRAVENOUS
  Filled 2022-06-05 (×2): qty 2

## 2022-06-05 MED ORDER — LORAZEPAM 1 MG PO TABS: 0.0000 mg | ORAL_TABLET | Freq: Two times a day (BID) | ORAL | Status: DC

## 2022-06-05 MED ORDER — ALBUTEROL (5 MG/ML) CONTINUOUS INHALATION SOLN
10.0000 mg/h | INHALATION_SOLUTION | Freq: Once | RESPIRATORY_TRACT | Status: AC
  Administered 2022-06-05: 10 mg/h via RESPIRATORY_TRACT

## 2022-06-05 MED ORDER — MOMETASONE FURO-FORMOTEROL FUM 200-5 MCG/ACT IN AERO
2.0000 | INHALATION_SPRAY | Freq: Two times a day (BID) | RESPIRATORY_TRACT | Status: DC
  Administered 2022-06-05 – 2022-06-07 (×4): 2 via RESPIRATORY_TRACT
  Filled 2022-06-05: qty 8.8

## 2022-06-05 MED ORDER — ACETAMINOPHEN 650 MG RE SUPP: 650.0000 mg | Freq: Four times a day (QID) | RECTAL | Status: DC | PRN

## 2022-06-05 MED ORDER — LORAZEPAM 1 MG PO TABS: 1.0000 mg | ORAL_TABLET | ORAL | Status: DC | PRN

## 2022-06-05 MED ORDER — THIAMINE HCL 100 MG/ML IJ SOLN
100.0000 mg | Freq: Every day | INTRAMUSCULAR | Status: DC
  Filled 2022-06-05: qty 2

## 2022-06-05 MED ORDER — IPRATROPIUM-ALBUTEROL 0.5-2.5 (3) MG/3ML IN SOLN
3.0000 mL | Freq: Four times a day (QID) | RESPIRATORY_TRACT | Status: DC
  Administered 2022-06-05 – 2022-06-07 (×9): 3 mL via RESPIRATORY_TRACT
  Filled 2022-06-05 (×10): qty 3

## 2022-06-05 MED ORDER — METHYLPREDNISOLONE SODIUM SUCC 125 MG IJ SOLR: 125.0000 mg | Freq: Two times a day (BID) | INTRAMUSCULAR | Status: DC

## 2022-06-05 MED ORDER — NICOTINE 21 MG/24HR TD PT24
21.0000 mg | MEDICATED_PATCH | Freq: Every day | TRANSDERMAL | Status: DC
  Administered 2022-06-05 – 2022-06-07 (×3): 21 mg via TRANSDERMAL
  Filled 2022-06-05 (×3): qty 1

## 2022-06-05 MED ORDER — ALBUTEROL SULFATE (2.5 MG/3ML) 0.083% IN NEBU: 2.5000 mg | INHALATION_SOLUTION | RESPIRATORY_TRACT | Status: DC | PRN

## 2022-06-05 MED ORDER — FOLIC ACID 1 MG PO TABS
1.0000 mg | ORAL_TABLET | Freq: Every day | ORAL | Status: DC
  Administered 2022-06-05 – 2022-06-07 (×3): 1 mg via ORAL
  Filled 2022-06-05 (×3): qty 1

## 2022-06-05 MED ORDER — ONDANSETRON HCL 4 MG/2ML IJ SOLN: 4.0000 mg | Freq: Four times a day (QID) | INTRAMUSCULAR | Status: DC | PRN

## 2022-06-05 MED ORDER — PANTOPRAZOLE SODIUM 40 MG PO TBEC
40.0000 mg | DELAYED_RELEASE_TABLET | Freq: Every day | ORAL | Status: DC
  Administered 2022-06-05 – 2022-06-07 (×3): 40 mg via ORAL
  Filled 2022-06-05 (×3): qty 1

## 2022-06-05 MED ORDER — SODIUM CHLORIDE 0.9 % IV SOLN
2.0000 g | INTRAVENOUS | Status: DC
  Administered 2022-06-05 – 2022-06-07 (×3): 2 g via INTRAVENOUS
  Filled 2022-06-05 (×3): qty 20

## 2022-06-05 MED ORDER — PREDNISONE 20 MG PO TABS
40.0000 mg | ORAL_TABLET | Freq: Every day | ORAL | Status: DC
  Administered 2022-06-06 – 2022-06-07 (×2): 40 mg via ORAL
  Filled 2022-06-05 (×2): qty 2

## 2022-06-05 MED ORDER — SODIUM CHLORIDE 0.9 % IV SOLN: INTRAVENOUS | Status: DC

## 2022-06-05 MED ORDER — HEPARIN SODIUM (PORCINE) 5000 UNIT/ML IJ SOLN
5000.0000 [IU] | Freq: Three times a day (TID) | INTRAMUSCULAR | Status: DC
  Administered 2022-06-05 – 2022-06-07 (×7): 5000 [IU] via SUBCUTANEOUS
  Filled 2022-06-05 (×7): qty 1

## 2022-06-05 MED ORDER — ONDANSETRON HCL 4 MG PO TABS: 4.0000 mg | ORAL_TABLET | Freq: Four times a day (QID) | ORAL | Status: DC | PRN

## 2022-06-05 MED ORDER — MAGNESIUM SULFATE 4 GM/100ML IV SOLN
4.0000 g | Freq: Once | INTRAVENOUS | Status: AC
  Administered 2022-06-05: 4 g via INTRAVENOUS
  Filled 2022-06-05: qty 100

## 2022-06-05 MED ORDER — AMLODIPINE BESYLATE 5 MG PO TABS
10.0000 mg | ORAL_TABLET | Freq: Every day | ORAL | Status: DC
  Administered 2022-06-05 – 2022-06-07 (×3): 10 mg via ORAL
  Filled 2022-06-05 (×4): qty 2

## 2022-06-05 MED ORDER — THIAMINE MONONITRATE 100 MG PO TABS
100.0000 mg | ORAL_TABLET | Freq: Every day | ORAL | Status: DC
  Administered 2022-06-05 – 2022-06-07 (×3): 100 mg via ORAL
  Filled 2022-06-05 (×3): qty 1

## 2022-06-05 MED ORDER — ADULT MULTIVITAMIN W/MINERALS CH
1.0000 | ORAL_TABLET | Freq: Every day | ORAL | Status: DC
  Administered 2022-06-05 – 2022-06-07 (×3): 1 via ORAL
  Filled 2022-06-05 (×3): qty 1

## 2022-06-05 MED ORDER — ACETAMINOPHEN 325 MG PO TABS: 650.0000 mg | ORAL_TABLET | Freq: Four times a day (QID) | ORAL | Status: DC | PRN

## 2022-06-05 MED ORDER — PREDNISONE 20 MG PO TABS: 40.0000 mg | ORAL_TABLET | Freq: Every day | ORAL | Status: DC

## 2022-06-05 MED ORDER — DEXTROMETHORPHAN POLISTIREX ER 30 MG/5ML PO SUER
30.0000 mg | Freq: Two times a day (BID) | ORAL | Status: AC
  Administered 2022-06-05 – 2022-06-06 (×4): 30 mg via ORAL
  Filled 2022-06-05 (×6): qty 5

## 2022-06-05 MED ORDER — ATORVASTATIN CALCIUM 10 MG PO TABS
10.0000 mg | ORAL_TABLET | Freq: Every day | ORAL | Status: DC
  Administered 2022-06-05 – 2022-06-07 (×3): 10 mg via ORAL
  Filled 2022-06-05 (×3): qty 1

## 2022-06-05 NOTE — H&P (Signed)
History and Physical  [facility]  KEYNAN HEFFERN WUJ:[id number] DOB: 1961/03/28 DOA: 06/04/2022  PCP: Carrolyn Meiers, MD  Patient coming from: Home by [facility] Level of care: Telemetry  I have personally briefly reviewed patient's old medical records in [place]  Chief Complaint: SOB  HPI: Jesus Walker is a 62 year old male alcoholic, chronic tobacco abuser, COPD, GERD, hypertension, chronic hyponatremia, severe malnutrition, who was brought in by EMS from home with shortness of breath.  He was noted to be hypoxic with a pulse ox of 85% on room air.  He was given nebulizer treatments and Solu-Medrol.  He was placed on supplemental oxygen.  He had been discharged from the hospital on 04/04/2022 with a similar presentation of COPD exacerbation.  He is currently complaining of chest tightness and shortness of breath.  He was given nebulizer treatments in the ED remained hypoxic and admission was requested for further treatments.  Past Medical History:  Diagnosis Date   Alcohol dependence ([place])    Asthma    COPD (chronic obstructive pulmonary disease) ([place])    GERD (gastroesophageal reflux disease)    Hypertension     Past Surgical History:  Procedure Laterality Date   BRONCHIAL BRUSHINGS  01/03/2016   Procedure: BRONCHIAL BRUSHINGS;  Surgeon: Sinda Du, MD;  [facility];  Service: Cardiopulmonary;;   BRONCHIAL WASHINGS  01/03/2016   Procedure: BRONCHIAL WASHINGS;  Surgeon: Sinda Du, MD;  [facility];  Service: Cardiopulmonary;;   FLEXIBLE BRONCHOSCOPY N/A 01/03/2016   Procedure: FLEXIBLE BRONCHOSCOPY;  Surgeon: Sinda Du, MD;  Location[facility];  Service: Cardiopulmonary;  Laterality: N/A;     reports that he has been smoking cigarettes. He has been smoking an average of 1 pack per day. He has never used smokeless tobacco. He reports current alcohol use of about 12.0 standard drinks of alcohol per week. He reports that he does  not use drugs.  No Known Allergies  Family History  Problem Relation Age of Onset   Asthma Mother 67   Asthma Father 73    Prior to Admission medications   Medication Sig Start Date End Date Taking? Authorizing Provider  acetaminophen (TYLENOL) 500 MG tablet Take 500 mg by mouth every 6 (six) hours as needed.    [provider]  albuterol (PROVENTIL) (2.5 MG/3ML) 0.083% nebulizer solution Take 3 mLs (2.5 mg total) by nebulization 4 (four) times daily. 04/04/22   Danford, Suann Larry, MD  amLODipine (NORVASC) 10 MG tablet Take 10 mg by mouth daily. 05/31/21   [provider]  atorvastatin (LIPITOR) 10 MG tablet Take 10 mg by mouth daily. 03/04/22   [provider]  budesonide-formoterol (SYMBICORT) 160-4.5 MCG/ACT inhaler Inhale 2 puffs into the lungs 2 (two) times daily. 06/14/18   Barton Dubois, MD  folic acid (FOLVITE) 1 MG tablet Take 1 mg by mouth daily. 05/01/20   [provider]  omeprazole (PRILOSEC) 20 MG capsule Take 20 mg by mouth daily.    [provider]  PROAIR HFA 108 [phone] Base) MCG/ACT inhaler Inhale 2 puffs into the lungs 4 (four) times daily. 12/26/19   [provider]    Physical Exam: Vitals:   06/05/22 0600 06/05/22 0630 06/05/22 0632 06/05/22 0700  BP: (!) 140/82 (!) 152/70  (!) 144/78  Pulse: (!) 112 (!) 115  (!) 111  Resp: '14 17  14  '$ Temp:   98.7 F (37.1 C)   TempSrc:   Oral  SpO2: 98% 97%  99%  Weight:      Height:        Constitutional: frail, emaciated, chronically ill appearing male, NAD, calm, comfortable Eyes: PERRL, lids and conjunctivae normal ENMT: Mucous membranes are moist. Posterior pharynx clear of any exudate or lesions.Normal dentition.  Neck: normal, supple, no masses, no thyromegaly Respiratory: diffuse wheezing moderate increased work of breathing.  Cardiovascular: normal s1, s2 sounds, no murmurs / rubs / gallops. No extremity edema. 2+ pedal pulses. No carotid bruits.  Abdomen:  no tenderness, no masses palpated. No hepatosplenomegaly. Bowel sounds positive.  Musculoskeletal: no clubbing / cyanosis. No joint deformity upper and lower extremities. Good ROM, no contractures. Normal muscle tone.  Skin: no rashes, lesions, ulcers. No induration Neurologic: CN 2-12 grossly intact. Sensation intact, DTR normal. Strength 5/5 in all 4.  Psychiatric: Poor judgment and insight. Alert and oriented x 3. Normal mood.   Labs on Admission: I have personally reviewed following labs and imaging studies  CBC: Recent Labs  Lab 06/05/22 0018 06/05/22 0542  WBC 6.1 4.8  NEUTROABS  --  4.5  HGB 16.4 17.7*  HCT 47.5 50.0  MCV 104.6* 102.5*  PLT 172 916   Basic Metabolic Panel: Recent Labs  Lab 06/05/22 0018 06/05/22 0542  NA 126* 127*  K 3.4* 4.7  CL 89* 88*  CO2 26 26  GLUCOSE 101* 139*  BUN 5* 5*  CREATININE 0.75 0.84  CALCIUM 8.6* 9.0  MG  --  1.7   GFR: Estimated Creatinine Clearance: 69.5 mL/min (by C-G formula based on SCr of 0.84 mg/dL). Liver Function Tests: Recent Labs  Lab 06/05/22 0542  AST 56*  ALT 24  ALKPHOS 175*  BILITOT 1.5*  PROT 8.3*  ALBUMIN 4.2   No results for input(s): "LIPASE", "AMYLASE" in the last 168 hours. No results for input(s): "AMMONIA" in the last 168 hours. Coagulation Profile: No results for input(s): "INR", "PROTIME" in the last 168 hours. Cardiac Enzymes: No results for input(s): "CKTOTAL", "CKMB", "CKMBINDEX", "TROPONINI" in the last 168 hours. BNP (last 3 results) No results for input(s): "PROBNP" in the last 8760 hours. HbA1C: No results for input(s): "HGBA1C" in the last 72 hours. CBG: No results for input(s): "GLUCAP" in the last 168 hours. Lipid Profile: No results for input(s): "CHOL", "HDL", "LDLCALC", "TRIG", "CHOLHDL", "LDLDIRECT" in the last 72 hours. Thyroid Function Tests: No results for input(s): "TSH", "T4TOTAL", "FREET4", "T3FREE", "THYROIDAB" in the last 72 hours. Anemia Panel: No results for  input(s): "VITAMINB12", "FOLATE", "FERRITIN", "TIBC", "IRON", "RETICCTPCT" in the last 72 hours. Urine analysis:    Component Value Date/Time   COLORURINE AMBER (A) 05/21/2016 1145   APPEARANCEUR CLEAR 05/21/2016 1145   LABSPEC <1.005 (L) 05/21/2016 1145   PHURINE 6.0 05/21/2016 1145   GLUCOSEU NEGATIVE 05/21/2016 1145   HGBUR NEGATIVE 05/21/2016 1145   BILIRUBINUR NEGATIVE 05/21/2016 1145   KETONESUR NEGATIVE 05/21/2016 1145   PROTEINUR NEGATIVE 05/21/2016 1145   NITRITE NEGATIVE 05/21/2016 1145   LEUKOCYTESUR NEGATIVE 05/21/2016 1145    Radiological Exams on Admission: DG Chest Port 1 View  Result Date: 06/05/2022 CLINICAL DATA:  Shortness of breath EXAM: PORTABLE CHEST 1 VIEW COMPARISON:  04/03/2022 FINDINGS: Cardiac shadow is within normal limits. Lungs are well aerated bilaterally. No focal infiltrate or effusion is noted. Mild chronic scarring in the right base is noted. No bony abnormality is seen. IMPRESSION: Chronic changes without acute abnormality. Electronically Signed   By: Inez Catalina M.D.   On: 06/05/2022 00:14  EKG: Independently reviewed.   Assessment/Plan Principal Problem:   COPD exacerbation ([facility]) Active Problems:   Hyponatremia   Alcohol dependence ([facility])   Tobacco dependence   Chest tightness   Serum total bilirubin elevated   HTN (hypertension)   GERD (gastroesophageal reflux disease)   Benign essential HTN   Acute COPD Exacerbation Acute Respiratory Failure with Hypoxia  Acute Respiratory Distress  -treatments in ED failed to control symptom -admit for further treatments -continue IV steroids -continue scheduled bronchodilators -continue IV antibiotics -add flutter valve -wean oxygen as able   Hyponatremia - chronic -secondary to heavy daily alcohol abuse -IV normal saline ordered -recheck BMP in AM   Alcohol Abuse -admits to at least 6-12 beers per day -placed on CIWA alcohol withdrawal protocol -continue MVI, thiamine, folic  acid  Tobacco Abuse -counseled on cessation -nicotine patch 21 mg ordered  Essential Hypertension -resumed home amlodipine  Hyperlipidemia -resumed home atorvastatin  Hyperbilirubinemia -mild elevation likely from chronic heavy alcohol abuse   DVT prophylaxis: heparin  Code Status: full   Family Communication:   Disposition Plan: home   Consults called:   Admission status: OBV  Level of care: Telemetry Irwin Brakeman MD [facility] How to contact the [facility] Attending or Consulting provider 7A - 7P or covering provider during after hours 7P -7A, for this patient?  Check the care team in [facility] and look for a) attending/consulting [facility] provider listed and b) the [facility] team listed Log into [email or web address] and use [facility]'s universal password to access. If you do not have the password, please contact the hospital operator. Locate the [facility] provider you are looking for under [facility] and page to a number that you can be directly reached. If you still have difficulty reaching the provider, please page the [facility] (Director on Call) for the Hospitalists listed on amion for assistance.   If 7PM-7AM, please contact night-coverage [email or web address] Password [email or web address]  06/05/2022, 7:40 AM

## 2022-06-05 NOTE — ED Notes (Signed)
Pt moved to bedside recliner to sit for a while.

## 2022-06-05 NOTE — ED Provider Notes (Signed)
[facility] EMERGENCY DEPARTMENT Provider Note   CSN: [id number] Arrival date & time: 06/04/22  2353     History  Chief Complaint  Patient presents with   Shortness of Breath    Jesus Walker is a 62 y.o. male.  Presents to the emergency department for evaluation of shortness of breath.  He reports a history of COPD.  He has been feeling more short of breath for the last couple of days.  He has not had a fever or pain but he has had a cough.  Patient reports that he has been using his nebulizers but they have not relieved his shortness of breath.  He comes to the ER by ambulance.  He received Solu-Medrol and bronchodilator therapy during transport.  He is not normally on oxygen at home.       Home Medications Prior to Admission medications   Medication Sig Start Date End Date Taking? Authorizing Provider  acetaminophen (TYLENOL) 500 MG tablet Take 500 mg by mouth every 6 (six) hours as needed.    [provider]  albuterol (PROVENTIL) (2.5 MG/3ML) 0.083% nebulizer solution Take 3 mLs (2.5 mg total) by nebulization 4 (four) times daily. 04/04/22   Danford, Suann Larry, MD  amLODipine (NORVASC) 10 MG tablet Take 10 mg by mouth daily. 05/31/21   [provider]  atorvastatin (LIPITOR) 10 MG tablet Take 10 mg by mouth daily. 03/04/22   [provider]  budesonide-formoterol (SYMBICORT) 160-4.5 MCG/ACT inhaler Inhale 2 puffs into the lungs 2 (two) times daily. 06/14/18   Barton Dubois, MD  folic acid (FOLVITE) 1 MG tablet Take 1 mg by mouth daily. 05/01/20   [provider]  omeprazole (PRILOSEC) 20 MG capsule Take 20 mg by mouth daily.    [provider]  PROAIR HFA 108 [phone] Base) MCG/ACT inhaler Inhale 2 puffs into the lungs 4 (four) times daily. 12/26/19   [provider]      Allergies    Patient has no known allergies.    Review of Systems   Review of Systems  Physical Exam Updated Vital Signs BP (!) 161/80   Pulse (!)  112   Temp 98 F (36.7 C) (Oral)   Resp (!) 21   Ht '5\' 2"'$  (1.575 m)   Wt 56 kg   SpO2 (!) 88%   BMI 22.58 kg/m  Physical Exam Vitals and nursing note reviewed.  Constitutional:      Appearance: He is well-developed and underweight.  HENT:     Head: Normocephalic and atraumatic.     Mouth/Throat:     Mouth: Mucous membranes are moist.  Eyes:     General: Vision grossly intact. Gaze aligned appropriately.     Extraocular Movements: Extraocular movements intact.     Conjunctiva/sclera: Conjunctivae normal.  Cardiovascular:     Rate and Rhythm: Normal rate and regular rhythm.     Pulses: Normal pulses.     Heart sounds: Normal heart sounds, S1 normal and S2 normal. No murmur heard.    No friction rub. No gallop.  Pulmonary:     Effort: Tachypnea and accessory muscle usage present. No respiratory distress.     Breath sounds: Decreased air movement present. Decreased breath sounds and wheezing present.  Abdominal:     Palpations: Abdomen is soft.     Tenderness: There is no abdominal tenderness. There is no guarding or rebound.     Hernia: No hernia is present.  Musculoskeletal:  General: No swelling.     Cervical back: Full passive range of motion without pain, normal range of motion and neck supple. No pain with movement, spinous process tenderness or muscular tenderness. Normal range of motion.     Right lower leg: No edema.     Left lower leg: No edema.  Skin:    General: Skin is warm and dry.     Capillary Refill: Capillary refill takes less than 2 seconds.     Findings: No ecchymosis, erythema, lesion or wound.  Neurological:     Mental Status: He is alert and oriented to person, place, and time.     GCS: GCS eye subscore is 4. GCS verbal subscore is 5. GCS motor subscore is 6.     Cranial Nerves: Cranial nerves 2-12 are intact.     Sensory: Sensation is intact.     Motor: Motor function is intact. No weakness or abnormal muscle tone.     Coordination:  Coordination is intact.  Psychiatric:        Mood and Affect: Mood normal.        Speech: Speech normal.        Behavior: Behavior normal.     ED Results / Procedures / Treatments   Labs (all labs ordered are listed, but only abnormal results are displayed) Labs Reviewed  BASIC METABOLIC PANEL - Abnormal; Notable for the following components:      Result Value   Sodium 126 (*)    Potassium 3.4 (*)    Chloride 89 (*)    Glucose, Bld 101 (*)    BUN 5 (*)    Calcium 8.6 (*)    All other components within normal limits  CBC - Abnormal; Notable for the following components:   MCV 104.6 (*)    MCH 36.1 (*)    All other components within normal limits  RESP PANEL BY RT-PCR (RSV, FLU A&B, COVID)  [id number]    EKG EKG Interpretation  Date/Time:  Wednesday June 04 2022 23:59:53 EST Ventricular Rate:  104 PR Interval:  134 QRS Duration: 81 QT Interval:  342 QTC Calculation: 450 R Axis:   81 Text Interpretation: Sinus tachycardia LAE, consider biatrial enlargement Borderline right axis deviation Probable anteroseptal infarct, old Confirmed by Orpah Greek [phone]) on 06/05/2022 12:14:54 AM  Radiology DG Chest Port 1 View  Result Date: 06/05/2022 CLINICAL DATA:  Shortness of breath EXAM: PORTABLE CHEST 1 VIEW COMPARISON:  04/03/2022 FINDINGS: Cardiac shadow is within normal limits. Lungs are well aerated bilaterally. No focal infiltrate or effusion is noted. Mild chronic scarring in the right base is noted. No bony abnormality is seen. IMPRESSION: Chronic changes without acute abnormality. Electronically Signed   By: Inez Catalina M.D.   On: 06/05/2022 00:14    Procedures Procedures    Medications Ordered in ED Medications  albuterol (VENTOLIN HFA) 108 (90 Base) MCG/ACT inhaler 2 puff (has no administration in time range)  albuterol (PROVENTIL,VENTOLIN) solution continuous neb (10 mg/hr Nebulization Given 06/05/22 0017)    ED Course/ Medical Decision Making/ A&P                            Medical Decision Making Amount and/or Complexity of Data Reviewed External Data Reviewed: labs, radiology, ECG and notes. Labs: ordered. Decision-making details documented in ED Course. Radiology: ordered and independent interpretation performed. Decision-making details documented in ED Course. ECG/medicine tests: ordered and independent interpretation performed. Decision-making details  documented in ED Course.  Risk Prescription drug management. Decision regarding hospitalization.  Critical Care Total time providing critical care: 30 minutes   Presents to the emergency for evaluation of difficulty breathing.  Patient presents with significant bronchospasm at arrival.  He does have a history of COPD.  Patient received DuoNebs and Solu-Medrol during transport.  The chest x-ray at arrival does not show evidence of pneumonia.  Influenza, COVID, RSV negative.  Patient placed on continuous nebulizer treatment.  He still has fairly significant bronchospasm and mildly increased work of breathing at rest.  Off oxygen, dropped to 88%.  He does not use oxygen at home, will require hospitalization for further management of COPD exacerbation.          Final Clinical Impression(s) / ED Diagnoses Final diagnoses:  COPD exacerbation [facility])    Rx / DC Orders ED Discharge Orders     None         Trinia Georgi, Gwenyth Allegra, MD 06/05/22 [phone]

## 2022-06-05 NOTE — ED Notes (Signed)
Breakfast tray given to patient.

## 2022-06-05 NOTE — Progress Notes (Signed)
CAT was finished, checked on patient.  Patient still complaining of still some tightness and slight pain at a 5 out of 10.  Patient did state breathing is some better and patient is still wheezing.  Put patient on 1L [place] and sat is 96%.

## 2022-06-05 NOTE — TOC Progression Note (Signed)
  Transition of Care [facility]) Screening Note   Patient Details  Name: Jesus Walker Date of Birth: 08-29-1960   Transition of Care [facility]) CM/SW Contact:    Boneta Lucks, RN Phone Number: 06/05/2022, 2:57 PM  Patient in ED waiting on a bed. SA resources added to AVS.  Transition of Care Department [facility]) has reviewed patient and no TOC needs have been identified at this time. We will continue to monitor patient advancement through interdisciplinary progression rounds. If new patient transition needs arise, please place a TOC consult.    Expected Discharge Plan: Home/Self Care Barriers to Discharge: Continued Medical Work up

## 2022-06-05 NOTE — Hospital Course (Signed)
62 year old male alcoholic, chronic tobacco abuser, COPD, GERD, hypertension, chronic hyponatremia, severe malnutrition, who was brought in by EMS from home with shortness of breath.  He was noted to be hypoxic with a pulse ox of 85% on room air.  He was given nebulizer treatments and Solu-Medrol.  He was placed on supplemental oxygen.  He had been discharged from the hospital on 04/04/2022 with a similar presentation of COPD exacerbation.  He is currently complaining of chest tightness and shortness of breath.  He was given nebulizer treatments in the ED remained hypoxic and admission was requested for further treatments.

## 2022-06-06 DIAGNOSIS — R0789 Other chest pain: Secondary | ICD-10-CM | POA: Diagnosis not present

## 2022-06-06 DIAGNOSIS — J441 Chronic obstructive pulmonary disease with (acute) exacerbation: Secondary | ICD-10-CM | POA: Diagnosis not present

## 2022-06-06 DIAGNOSIS — F1029 Alcohol dependence with unspecified alcohol-induced disorder: Secondary | ICD-10-CM | POA: Diagnosis not present

## 2022-06-06 DIAGNOSIS — F17200 Nicotine dependence, unspecified, uncomplicated: Secondary | ICD-10-CM | POA: Diagnosis not present

## 2022-06-06 LAB — BASIC METABOLIC PANEL
Anion gap: 7 (ref 5–15)
BUN: 11 mg/dL (ref 8–23)
CO2: 26 mmol/L (ref 22–32)
Calcium: 8.4 mg/dL — ABNORMAL LOW (ref 8.9–10.3)
Chloride: 98 mmol/L (ref 98–111)
Creatinine, Ser: 0.85 mg/dL (ref 0.61–1.24)
GFR, Estimated: >60 mL/min (ref >60)
Glucose, Bld: 130 mg/dL — ABNORMAL HIGH (ref 70–99)
Potassium: 3.7 mmol/L (ref 3.5–5.1)
Sodium: 131 mmol/L — ABNORMAL LOW (ref 135–145)

## 2022-06-06 LAB — MAGNESIUM: Magnesium: 1.9 mg/dL (ref 1.7–2.4)

## 2022-06-06 MED ORDER — DOXYCYCLINE HYCLATE 100 MG PO TABS: 100.0000 mg | ORAL_TABLET | Freq: Two times a day (BID) | ORAL | Status: DC

## 2022-06-06 MED ORDER — ENSURE ENLIVE PO LIQD: 237.0000 mL | Freq: Two times a day (BID) | ORAL | Status: DC

## 2022-06-06 MED ORDER — DOXYCYCLINE HYCLATE 100 MG PO TABS
100.0000 mg | ORAL_TABLET | Freq: Two times a day (BID) | ORAL | Status: DC
  Administered 2022-06-06 – 2022-06-07 (×2): 100 mg via ORAL
  Filled 2022-06-06 (×2): qty 1

## 2022-06-06 NOTE — Progress Notes (Signed)
PROGRESS NOTE   Jesus Walker  UMP:[id number] DOB: 28-Feb-1961 DOA: 06/04/2022 PCP: Carrolyn Meiers, MD   Chief Complaint  Patient presents with   Shortness of Breath   Level of care: Med-Surg  Brief Admission History:  62 year old male alcoholic, chronic tobacco abuser, COPD, GERD, hypertension, chronic hyponatremia, severe malnutrition, who was brought in by EMS from home with shortness of breath.  He was noted to be hypoxic with a pulse ox of 85% on room air.  He was given nebulizer treatments and Solu-Medrol.  He was placed on supplemental oxygen.  He had been discharged from the hospital on 04/04/2022 with a similar presentation of COPD exacerbation.  He is currently complaining of chest tightness and shortness of breath.  He was given nebulizer treatments in the ED remained hypoxic and admission was requested for further treatments.   Assessment and Plan:  Acute COPD Exacerbation Acute Respiratory Failure with Hypoxia  Acute Respiratory Distress  -treatments in ED failed to control symptom -admit for further treatments -continue IV steroids -continue scheduled bronchodilators -continue IV antibiotics -add flutter valve -wean oxygen as able    Hyponatremia - chronic -improved with hydration -secondary to heavy daily alcohol abuse -IV normal saline ordered -recheck BMP in AM    Alcohol Abuse -admits to at least 6-12 beers per day -placed on CIWA alcohol withdrawal protocol -continue MVI, thiamine, folic acid -TOC counseled on SA resources   Tobacco Abuse -counseled on cessation -nicotine patch 21 mg ordered   Essential Hypertension -resumed home amlodipine   Hyperlipidemia -resumed home atorvastatin   Hyperbilirubinemia -mild elevation likely from chronic heavy alcohol abuse  DVT prophylaxis: [place] heparin Code Status: full  Family Communication:  Disposition: Status is: Inpatient Remains inpatient appropriate because: IV treatments    Consultants:    Procedures:   Antimicrobials:  Doxycycline 1/4>> Ceftriaxone 1/4>>  Subjective: Pt reporting SOB, cough and chest congestion   Objective: Vitals:   06/06/22 0852 06/06/22 0853 06/06/22 1321 06/06/22 1422  BP:   127/63   Pulse:   (!) 107   Resp:   20   Temp:   98 F (36.7 C)   TempSrc:   Oral   SpO2: 98% 97% 99% 97%  Weight:      Height:        Intake/Output Summary (Last 24 hours) at 06/06/2022 1755 Last data filed at 06/06/2022 1747 Gross per 24 hour  Intake 2375.35 ml  Output 325 ml  Net 2050.35 ml   Filed Weights   06/04/22 2356 06/05/22 2135  Weight: 56 kg 56.2 kg   Examination:  General exam: Appears calm and comfortable  Respiratory system: diffuse exp wheezing.  Cardiovascular system: normal S1 & S2 heard. No JVD, murmurs, rubs, gallops or clicks. No pedal edema. Gastrointestinal system: Abdomen is nondistended, soft and nontender. No organomegaly or masses felt. Normal bowel sounds heard. Central nervous system: Alert and oriented. No focal neurological deficits. Extremities: Symmetric 5 x 5 power. Skin: No rashes, lesions or ulcers. Psychiatry: Judgement and insight appear poor. Mood & affect appropriate.   Data Reviewed: I have personally reviewed following labs and imaging studies  CBC: Recent Labs  Lab 06/05/22 0018 06/05/22 0542  WBC 6.1 4.8  NEUTROABS  --  4.5  HGB 16.4 17.7*  HCT 47.5 50.0  MCV 104.6* 102.5*  PLT 172 400    Basic Metabolic Panel: Recent Labs  Lab 06/05/22 0018 06/05/22 0542 06/06/22 0314  NA 126* 127* 131*  K 3.4* 4.7 3.7  CL 89* 88* 98  CO2 '26 26 26  '$ GLUCOSE 101* 139* 130*  BUN 5* 5* 11  CREATININE 0.75 0.84 0.85  CALCIUM 8.6* 9.0 8.4*  MG  --  1.7 1.9    CBG: No results for input(s): "GLUCAP" in the last 168 hours.  Recent Results (from the past 240 hour(s))  Resp panel by RT-PCR (RSV, Flu A&B, Covid) Anterior Nasal Swab     Status: None   Collection Time: 06/05/22 12:08 AM   Specimen: Anterior Nasal  Swab  Result Value Ref Range Status   SARS Coronavirus 2 by RT PCR NEGATIVE NEGATIVE Final    Comment: (NOTE) SARS-CoV-2 target nucleic acids are NOT DETECTED.  The SARS-CoV-2 RNA is generally detectable in upper respiratory specimens during the acute phase of infection. The lowest concentration of SARS-CoV-2 viral copies this assay can detect is 138 copies/mL. A negative result does not preclude SARS-Cov-2 infection and should not be used as the sole basis for treatment or other patient management decisions. A negative result may occur with  improper specimen collection/handling, submission of specimen other than nasopharyngeal swab, presence of viral mutation(s) within the areas targeted by this assay, and inadequate number of viral copies(<138 copies/mL). A negative result must be combined with clinical observations, patient history, and epidemiological information. The expected result is Negative.  Fact Sheet for Patients:  EntrepreneurPulse.com.au  Fact Sheet for Healthcare Providers:  IncredibleEmployment.be  This test is no t yet approved or cleared by the [place] FDA and  has been authorized for detection and/or diagnosis of SARS-CoV-2 by FDA under an Emergency Use Authorization (EUA). This EUA will remain  in effect (meaning this test can be used) for the duration of the COVID-19 declaration under Section 564(b)(1) of the Act, 21 U.S.C.section 360bbb-3(b)(1), unless the authorization is terminated  or revoked sooner.       Influenza A by PCR NEGATIVE NEGATIVE Final   Influenza B by PCR NEGATIVE NEGATIVE Final    Comment: (NOTE) The Xpert Xpress SARS-CoV-2/FLU/RSV plus assay is intended as an aid in the diagnosis of influenza from Nasopharyngeal swab specimens and should not be used as a sole basis for treatment. Nasal washings and aspirates are unacceptable for Xpert Xpress SARS-CoV-2/FLU/RSV testing.  Fact Sheet for  Patients: EntrepreneurPulse.com.au  Fact Sheet for Healthcare Providers: IncredibleEmployment.be  This test is not yet approved or cleared by the [place] FDA and has been authorized for detection and/or diagnosis of SARS-CoV-2 by FDA under an Emergency Use Authorization (EUA). This EUA will remain in effect (meaning this test can be used) for the duration of the COVID-19 declaration under Section 564(b)(1) of the Act, 21 U.S.C. section 360bbb-3(b)(1), unless the authorization is terminated or revoked.     Resp Syncytial Virus by PCR NEGATIVE NEGATIVE Final    Comment: (NOTE) Fact Sheet for Patients: EntrepreneurPulse.com.au  Fact Sheet for Healthcare Providers: IncredibleEmployment.be  This test is not yet approved or cleared by the [place] FDA and has been authorized for detection and/or diagnosis of SARS-CoV-2 by FDA under an Emergency Use Authorization (EUA). This EUA will remain in effect (meaning this test can be used) for the duration of the COVID-19 declaration under Section 564(b)(1) of the Act, 21 U.S.C. section 360bbb-3(b)(1), unless the authorization is terminated or revoked.  Performed at [facility], [address] [place], [place] [address]      Radiology Studies: [facility] Building Chest [facility] 1 View  Result Date: 06/05/2022 CLINICAL DATA:  Shortness of breath EXAM: PORTABLE CHEST  1 VIEW COMPARISON:  04/03/2022 FINDINGS: Cardiac shadow is within normal limits. Lungs are well aerated bilaterally. No focal infiltrate or effusion is noted. Mild chronic scarring in the right base is noted. No bony abnormality is seen. IMPRESSION: Chronic changes without acute abnormality. Electronically Signed   By: Inez Catalina M.D.   On: 06/05/2022 00:14    Scheduled Meds:  amLODipine  10 mg Oral Daily   atorvastatin  10 mg Oral Daily   dextromethorphan  30 mg Oral BID   doxycycline  100 mg Oral Q12H   feeding  supplement  237 mL Oral BID BM   folic acid  1 mg Oral Daily   heparin  5,000 Units Subcutaneous Q8H   ipratropium-albuterol  3 mL Nebulization Q6H   LORazepam  0-4 mg Oral Q6H   Followed by   Derrill Memo ON 06/07/2022] LORazepam  0-4 mg Oral Q12H   mometasone-formoterol  2 puff Inhalation BID   multivitamin with minerals  1 tablet Oral Daily   nicotine  21 mg Transdermal Daily   pantoprazole  40 mg Oral Daily   predniSONE  40 mg Oral Q breakfast   thiamine  100 mg Oral Daily   Or   thiamine  100 mg Intravenous Daily   Continuous Infusions:  sodium chloride 75 mL/hr at 06/06/22 1312   cefTRIAXone (ROCEPHIN)  IV 2 g (06/06/22 1003)     LOS: 1 day   Time spent: 63 mins  Eh Sesay Wynetta Emery, MD How to contact the [facility] Attending or Consulting provider [place] or covering provider during after hours [place], for this patient?  Check the care team in [facility] and look for a) attending/consulting TRH provider listed and b) the [facility] team listed Log into [email or web address] and use [place]'s universal password to access. If you do not have the password, please contact the hospital operator. Locate the [facility] provider you are looking for under [facility] and page to a number that you can be directly reached. If you still have difficulty reaching the provider, please page the [facility] (Director on Call) for the Hospitalists listed on amion for assistance.  06/06/2022, 5:55 PM

## 2022-06-06 NOTE — TOC Progression Note (Signed)
Transition of Care [facility]) - Progression Note    Patient Details  Name: Jesus Walker MRN: [id number] Date of Birth: 09-25-1960  Transition of Care [facility]) CM/SW Contact  Salome Arnt, [place] Phone Number: 06/06/2022, 9:38 AM  Clinical Narrative:  TOC received consult for substance abuse treatment resources. Pt admits to drinking 4-5 beers a day. He does not feel this is a problem for him and said he has cut back on his own. Pt is aware substance abuse treatment resources were added to AVS. No other needs reported at this time.      Expected Discharge Plan: Home/Self Care Barriers to Discharge: Continued Medical Work up  Expected Discharge Plan and Services                                               Social Determinants of Health (SDOH) Interventions SDOH Screenings   Food Insecurity: No Food Insecurity (06/05/2022)  Housing: Low Risk  (06/05/2022)  Transportation Needs: Unmet Transportation Needs (06/05/2022)  Utilities: Not At Risk (06/05/2022)  Tobacco Use: High Risk (06/05/2022)    Readmission Risk Interventions     No data to display

## 2022-06-06 NOTE — Progress Notes (Signed)
Initial Nutrition Assessment  DOCUMENTATION CODES:      INTERVENTION:  Ensure Enlive po BID   Obtain meal preferences  NUTRITION DIAGNOSIS:  Malnutrition; stable based on weight history and reported intake  GOAL:  Patient will meet greater than or equal to 90% of their needs  MONITOR:  PO intake, Supplement acceptance, Labs, Weight trends  REASON FOR ASSESSMENT:   Consult Assessment of nutrition requirement/status  ASSESSMENT: Patient is a 62 yo male with hx of GERD, daily alcohol intake, COPD, chronic hyponatremia, malnutrition and tobacco use. Presents with shortness of breath.   Patient eating lunch- po today 100% breakfast and lunch. Feeding himself. Demonstrating good appetite sufficient to meet estimated nutrition needs.  Hx of malnutrition, stable.    Weights stable 50-56 kg the past 5+ years with one outlier weight of 61 kg.  Medications: Folvite, MVI, Nicoderm patch, thiamine.   IVF- NS@ 75 ml/hr      Latest Ref Rng & Units 06/06/2022    3:14 AM 06/05/2022    5:42 AM 06/05/2022   12:18 AM  BMP  Glucose 70 - 99 mg/dL 130  139  101   BUN 8 - 23 mg/dL '11  5  5   '$ Creatinine 0.61 - 1.24 mg/dL 0.85  0.84  0.75   Sodium 135 - 145 mmol/L 131  127  126   Potassium 3.5 - 5.1 mmol/L 3.7  4.7  3.4   Chloride 98 - 111 mmol/L 98  88  89   CO2 22 - 32 mmol/L '26  26  26   '$ Calcium 8.9 - 10.3 mg/dL 8.4  9.0  8.6       NUTRITION - FOCUSED PHYSICAL EXAM: Deferred - patient is eating lunch   Diet Order:   Diet Order             Diet Heart Room service appropriate? Yes; Fluid consistency: Thin  Diet effective now                   EDUCATION NEEDS:  Education needs have been addressed  Skin:  Skin Assessment: Reviewed RN Assessment  Last BM:  1/4  Height:   Ht Readings from Last 1 Encounters:  06/05/22 '5\' 4"'$  (1.626 m)    Weight:   Wt Readings from Last 1 Encounters:  06/05/22 56.2 kg    Ideal Body Weight:   55 kg  BMI:  Body mass index is 21.27  kg/m.  Estimated Nutritional Needs:   Kcal:  2000-2200  Protein:  78-90 gr  Fluid:  >1600 ml daily  Colman Cater MS,RD,CSG,LDN Contact: Shea Evans

## 2022-06-07 DIAGNOSIS — J441 Chronic obstructive pulmonary disease with (acute) exacerbation: Secondary | ICD-10-CM | POA: Diagnosis not present

## 2022-06-07 DIAGNOSIS — E871 Hypo-osmolality and hyponatremia: Secondary | ICD-10-CM | POA: Diagnosis not present

## 2022-06-07 DIAGNOSIS — F1029 Alcohol dependence with unspecified alcohol-induced disorder: Secondary | ICD-10-CM | POA: Diagnosis not present

## 2022-06-07 DIAGNOSIS — I10 Essential (primary) hypertension: Secondary | ICD-10-CM | POA: Diagnosis not present

## 2022-06-07 LAB — BASIC METABOLIC PANEL
Anion gap: 7 (ref 5–15)
BUN: 16 mg/dL (ref 8–23)
CO2: 27 mmol/L (ref 22–32)
Calcium: 8.5 mg/dL — ABNORMAL LOW (ref 8.9–10.3)
Chloride: 99 mmol/L (ref 98–111)
Creatinine, Ser: 0.85 mg/dL (ref 0.61–1.24)
GFR, Estimated: >60 mL/min (ref >60)
Glucose, Bld: 131 mg/dL — ABNORMAL HIGH (ref 70–99)
Potassium: 3.8 mmol/L (ref 3.5–5.1)
Sodium: 133 mmol/L — ABNORMAL LOW (ref 135–145)

## 2022-06-07 LAB — PHOSPHORUS: Phosphorus: 2.7 mg/dL (ref 2.5–4.6)

## 2022-06-07 LAB — MAGNESIUM: Magnesium: 1.5 mg/dL — ABNORMAL LOW (ref 1.7–2.4)

## 2022-06-07 MED ORDER — DOXYCYCLINE HYCLATE 100 MG PO TABS: 100.0000 mg | ORAL_TABLET | Freq: Two times a day (BID) | ORAL | 0 refills | Status: AC

## 2022-06-07 MED ORDER — MAGNESIUM SULFATE 4 GM/100ML IV SOLN
4.0000 g | Freq: Once | INTRAVENOUS | Status: AC
  Administered 2022-06-07: 4 g via INTRAVENOUS
  Filled 2022-06-07: qty 100

## 2022-06-07 MED ORDER — PREDNISONE 20 MG PO TABS: 40.0000 mg | ORAL_TABLET | Freq: Every day | ORAL | 0 refills | Status: AC

## 2022-06-07 MED ORDER — VITAMIN B-1 100 MG PO TABS: 100.0000 mg | ORAL_TABLET | Freq: Every day | ORAL | 1 refills | Status: DC

## 2022-06-07 NOTE — Discharge Instructions (Signed)
IMPORTANT INFORMATION: PAY CLOSE ATTENTION   PHYSICIAN DISCHARGE INSTRUCTIONS  Follow with Primary care provider  Carrolyn Meiers, MD  and other consultants as instructed by your Hospitalist Physician  [place] IF SYMPTOMS COME BACK, WORSEN OR NEW PROBLEM DEVELOPS   Please note: You were cared for by a hospitalist during your hospital stay. Every effort will be made to forward records to your primary care provider.  You can request that your primary care provider send for your hospital records if they have not received them.  Once you are discharged, your primary care physician will handle any further medical issues. Please note that NO REFILLS for any discharge medications will be authorized once you are discharged, as it is imperative that you return to your primary care physician (or establish a relationship with a primary care physician if you do not have one) for your post hospital discharge needs so that they can reassess your need for medications and monitor your lab values.  Please get a complete blood count and chemistry panel checked by your Primary MD at your next visit, and again as instructed by your Primary MD.  Get Medicines reviewed and adjusted: Please take all your medications with you for your next visit with your Primary MD  Laboratory/radiological data: Please request your Primary MD to go over all hospital tests and procedure/radiological results at the follow up, please ask your primary care provider to get all Hospital records sent to his/her office.  In some cases, they will be blood work, cultures and biopsy results pending at the time of your discharge. Please request that your primary care provider follow up on these results.  If you are diabetic, please bring your blood sugar readings with you to your follow up appointment with primary care.    Please call and make your follow up appointments as soon as possible.     Also Note the following: If you experience worsening of your admission symptoms, develop shortness of breath, life threatening emergency, suicidal or homicidal thoughts you must seek medical attention immediately by calling 911 or calling your MD immediately  if symptoms less severe.  You must read complete instructions/literature along with all the possible adverse reactions/side effects for all the Medicines you take and that have been prescribed to you. Take any new Medicines after you have completely understood and accpet all the possible adverse reactions/side effects.   Do not drive when taking Pain medications or sleeping medications (Benzodiazepines)  Do not take more than prescribed Pain, Sleep and Anxiety Medications. It is not advisable to combine anxiety,sleep and pain medications without talking with your primary care practitioner  Special Instructions: If you have smoked or chewed Tobacco  in the last 2 yrs please stop smoking, stop any regular Alcohol  and or any Recreational drug use.  Wear Seat belts while driving.  Do not drive if taking any narcotic, mind altering or controlled substances or recreational drugs or alcohol.

## 2022-06-07 NOTE — Progress Notes (Signed)
Ng Discharge Note  Admit Date:  06/04/2022 Discharge date: 06/07/2022   [place] Housekeeper to be D/C'd Home per MD order.  AVS completed. Patient/caregiver able to verbalize understanding.  Discharge Medication: Allergies as of 06/07/2022   No Known Allergies      Medication List     STOP taking these medications    naproxen 500 MG tablet Commonly known as: NAPROSYN       TAKE these medications    acetaminophen 500 MG tablet Commonly known as: TYLENOL Take 500 mg by mouth every 6 (six) hours as needed for mild pain.   amLODipine 10 MG tablet Commonly known as: NORVASC Take 10 mg by mouth daily.   atorvastatin 10 MG tablet Commonly known as: LIPITOR Take 10 mg by mouth daily.   budesonide-formoterol 160-4.5 MCG/ACT inhaler Commonly known as: Symbicort Inhale 2 puffs into the lungs 2 (two) times daily.   doxycycline 100 MG tablet Commonly known as: VIBRA-TABS Take 1 tablet (100 mg total) by mouth every 12 (twelve) hours for 5 days.   folic acid 1 MG tablet Commonly known as: FOLVITE Take 1 mg by mouth daily.   ipratropium-albuterol 0.5-2.5 (3) MG/3ML Soln Commonly known as: DUONEB Take 3 mLs by nebulization every 4 (four) hours as needed (SOB/wheezing).   omeprazole 20 MG capsule Commonly known as: PRILOSEC Take 20 mg by mouth daily.   predniSONE 20 MG tablet Commonly known as: DELTASONE Take 2 tablets (40 mg total) by mouth daily with breakfast for 7 days. Start taking on: June 08, 2022   ProAir HFA 108 (90 Base) MCG/ACT inhaler Generic drug: albuterol Inhale 2 puffs into the lungs 4 (four) times daily.   albuterol (2.5 MG/3ML) 0.083% nebulizer solution Commonly known as: PROVENTIL Take 3 mLs (2.5 mg total) by nebulization 4 (four) times daily.   thiamine 100 MG tablet Commonly known as: Vitamin B-1 Take 1 tablet (100 mg total) by mouth daily. Start taking on: June 08, 2022        Discharge Assessment: Vitals:   06/07/22 0729 06/07/22 0735   BP:    Pulse:    Resp:    Temp:    SpO2: 98% 98%   Skin clean, dry and intact without evidence of skin break down, no evidence of skin tears noted. IV catheter discontinued intact. Site without signs and symptoms of complications - no redness or edema noted at insertion site, patient denies c/o pain - only slight tenderness at site.  Dressing with slight pressure applied.  D/c Instructions-Education: Discharge instructions given to patient/family with verbalized understanding. D/c education completed with patient/family including follow up instructions, medication list, d/c activities limitations if indicated, with other d/c instructions as indicated by MD - patient able to verbalize understanding, all questions fully answered. Patient instructed to return to ED, call 911, or call MD for any changes in condition.  Patient escorted via [place], and D/C home via private auto.  Richrd Prime, LPN 08/05/5730 2:02 PM

## 2022-06-07 NOTE — Discharge Summary (Signed)
Physician Discharge Summary  NED KAKAR WRU:[id number] DOB: 04-30-1961 DOA: 06/04/2022  PCP: Carrolyn Meiers, MD  Admit date: 06/04/2022 Discharge date: 06/07/2022  Admitted From:  Home  Disposition: Home   Recommendations for Outpatient Follow-up:  Follow up with PCP in 1 weeks Please avoid all alcoholic beverages   Discharge Condition: STABLE   CODE STATUS: FULL DIET:  [place] DIET  Brief Hospitalization Summary: Please see all hospital notes, images, labs for full details of the hospitalization. 62 year old male alcoholic, chronic tobacco abuser, COPD, GERD, hypertension, chronic hyponatremia, severe malnutrition, who was brought in by EMS from home with shortness of breath.  He was noted to be hypoxic with a pulse ox of 85% on room air.  He was given nebulizer treatments and Solu-Medrol.  He was placed on supplemental oxygen.  He had been discharged from the hospital on 04/04/2022 with a similar presentation of COPD exacerbation.  He is currently complaining of chest tightness and shortness of breath.  He was given nebulizer treatments in the ED remained hypoxic and admission was requested for further treatments.  HOSPITAL COURSE BY PROBLEM   Acute COPD Exacerbation Acute Respiratory Failure with Hypoxia  Acute Respiratory Distress  -treatments in ED failed to control symptom -admit for further treatments -treated with IV steroids and will send home on prednisone taper -continue scheduled bronchodilators -treated with IV antibiotics -added flutter valve with good results  -weaned to room air  -Pt strongly advised to stop all tobacco use    Hyponatremia - chronic -improved with hydration -secondary to heavy daily alcohol abuse -IV normal saline ordered -repleted with IV fluid     Alcohol Abuse -admits to at least 6-12 beers per day -placed on CIWA alcohol withdrawal protocol -continue MVI, thiamine, folic acid -TOC counseled on SA resources   Tobacco  Abuse -counseled on cessation -nicotine patch 21 mg ordered   Essential Hypertension -resumed home amlodipine   Hyperlipidemia -resumed home atorvastatin   Hyperbilirubinemia -mild elevation likely from chronic heavy alcohol abuse  Discharge Diagnoses:  Principal Problem:   COPD exacerbation ([place]) Active Problems:   Hyponatremia   Alcohol dependence (HCC)   Tobacco dependence   Chest tightness   Serum total bilirubin elevated   HTN (hypertension)   GERD (gastroesophageal reflux disease)   Benign essential HTN   COPD with acute exacerbation [facility])   Discharge Instructions:  Allergies as of 06/07/2022   No Known Allergies      Medication List     STOP taking these medications    naproxen 500 MG tablet Commonly known as: NAPROSYN       TAKE these medications    acetaminophen 500 MG tablet Commonly known as: TYLENOL Take 500 mg by mouth every 6 (six) hours as needed for mild pain.   amLODipine 10 MG tablet Commonly known as: NORVASC Take 10 mg by mouth daily.   atorvastatin 10 MG tablet Commonly known as: LIPITOR Take 10 mg by mouth daily.   budesonide-formoterol 160-4.5 MCG/ACT inhaler Commonly known as: Symbicort Inhale 2 puffs into the lungs 2 (two) times daily.   doxycycline 100 MG tablet Commonly known as: VIBRA-TABS Take 1 tablet (100 mg total) by mouth every 12 (twelve) hours for 5 days.   folic acid 1 MG tablet Commonly known as: FOLVITE Take 1 mg by mouth daily.   ipratropium-albuterol 0.5-2.5 (3) MG/3ML Soln Commonly known as: DUONEB Take 3 mLs by nebulization every 4 (four) hours as needed (SOB/wheezing).   omeprazole 20 MG  capsule Commonly known as: PRILOSEC Take 20 mg by mouth daily.   predniSONE 20 MG tablet Commonly known as: DELTASONE Take 2 tablets (40 mg total) by mouth daily with breakfast for 7 days. Start taking on: June 08, 2022   ProAir HFA 108 (90 Base) MCG/ACT inhaler Generic drug: albuterol Inhale 2 puffs into  the lungs 4 (four) times daily.   albuterol (2.5 MG/3ML) 0.083% nebulizer solution Commonly known as: PROVENTIL Take 3 mLs (2.5 mg total) by nebulization 4 (four) times daily.   thiamine 100 MG tablet Commonly known as: Vitamin B-1 Take 1 tablet (100 mg total) by mouth daily. Start taking on: June 08, 2022        Follow-up Information     Services, [facility]. Schedule an appointment as soon as possible for a visit.   Why: For substance abuse counseling Contact information: [place] [address] [phone]         Carrolyn Meiers, MD. Schedule an appointment as soon as possible for a visit in 1 week(s).   Specialty: Internal Medicine Why: Hospital Follow Up Contact information: [place] [address] [phone]                No Known Allergies Allergies as of 06/07/2022   No Known Allergies      Medication List     STOP taking these medications    naproxen 500 MG tablet Commonly known as: NAPROSYN       TAKE these medications    acetaminophen 500 MG tablet Commonly known as: TYLENOL Take 500 mg by mouth every 6 (six) hours as needed for mild pain.   amLODipine 10 MG tablet Commonly known as: NORVASC Take 10 mg by mouth daily.   atorvastatin 10 MG tablet Commonly known as: LIPITOR Take 10 mg by mouth daily.   budesonide-formoterol 160-4.5 MCG/ACT inhaler Commonly known as: Symbicort Inhale 2 puffs into the lungs 2 (two) times daily.   doxycycline 100 MG tablet Commonly known as: VIBRA-TABS Take 1 tablet (100 mg total) by mouth every 12 (twelve) hours for 5 days.   folic acid 1 MG tablet Commonly known as: FOLVITE Take 1 mg by mouth daily.   ipratropium-albuterol 0.5-2.5 (3) MG/3ML Soln Commonly known as: DUONEB Take 3 mLs by nebulization every 4 (four) hours as needed (SOB/wheezing).   omeprazole 20 MG capsule Commonly known as: PRILOSEC Take 20 mg by mouth daily.    predniSONE 20 MG tablet Commonly known as: DELTASONE Take 2 tablets (40 mg total) by mouth daily with breakfast for 7 days. Start taking on: June 08, 2022   ProAir HFA 108 (90 Base) MCG/ACT inhaler Generic drug: albuterol Inhale 2 puffs into the lungs 4 (four) times daily.   albuterol (2.5 MG/3ML) 0.083% nebulizer solution Commonly known as: PROVENTIL Take 3 mLs (2.5 mg total) by nebulization 4 (four) times daily.   thiamine 100 MG tablet Commonly known as: Vitamin B-1 Take 1 tablet (100 mg total) by mouth daily. Start taking on: June 08, 2022       Procedures/Studies: [facility] Chest [place] 1 View  Result Date: 06/05/2022 CLINICAL DATA:  Shortness of breath EXAM: PORTABLE CHEST 1 VIEW COMPARISON:  04/03/2022 FINDINGS: Cardiac shadow is within normal limits. Lungs are well aerated bilaterally. No focal infiltrate or effusion is noted. Mild chronic scarring in the right base is noted. No bony abnormality is seen. IMPRESSION: Chronic changes without acute abnormality. Electronically Signed   By: Elta Guadeloupe  Lukens M.D.   On: 06/05/2022 00:14     Subjective: Pt reports he is breathing much better today.  He wants to go home.    Discharge Exam: Vitals:   06/07/22 0729 06/07/22 0735  BP:    Pulse:    Resp:    Temp:    SpO2: 98% 98%   Vitals:   06/07/22 0155 06/07/22 0402 06/07/22 0729 06/07/22 0735  BP:  124/67    Pulse:  93    Resp:  18    Temp:  98 F (36.7 C)    TempSrc:      SpO2: 99% 97% 98% 98%  Weight:      Height:       General: Pt is alert, awake, not in acute distress Cardiovascular: normal S1/S2 +, no rubs, no gallops Respiratory: CTA bilaterally, no increased work of breathing Abdominal: Soft, NT, ND, bowel sounds + Extremities: no edema, no cyanosis   The results of significant diagnostics from this hospitalization (including imaging, microbiology, ancillary and laboratory) are listed below for reference.     Microbiology: Recent Results (from the past 240  hour(s))  Resp panel by RT-PCR (RSV, Flu A&B, Covid) Anterior Nasal Swab     Status: None   Collection Time: 06/05/22 12:08 AM   Specimen: Anterior Nasal Swab  Result Value Ref Range Status   SARS Coronavirus 2 by RT PCR NEGATIVE NEGATIVE Final    Comment: (NOTE) SARS-CoV-2 target nucleic acids are NOT DETECTED.  The SARS-CoV-2 RNA is generally detectable in upper respiratory specimens during the acute phase of infection. The lowest concentration of SARS-CoV-2 viral copies this assay can detect is 138 copies/mL. A negative result does not preclude SARS-Cov-2 infection and should not be used as the sole basis for treatment or other patient management decisions. A negative result may occur with  improper specimen collection/handling, submission of specimen other than nasopharyngeal swab, presence of viral mutation(s) within the areas targeted by this assay, and inadequate number of viral copies(<138 copies/mL). A negative result must be combined with clinical observations, patient history, and epidemiological information. The expected result is Negative.  Fact Sheet for Patients:  EntrepreneurPulse.com.au  Fact Sheet for Healthcare Providers:  IncredibleEmployment.be  This test is no t yet approved or cleared by the [place] FDA and  has been authorized for detection and/or diagnosis of SARS-CoV-2 by FDA under an Emergency Use Authorization (EUA). This EUA will remain  in effect (meaning this test can be used) for the duration of the COVID-19 declaration under Section 564(b)(1) of the Act, 21 U.S.C.section 360bbb-3(b)(1), unless the authorization is terminated  or revoked sooner.       Influenza A by PCR NEGATIVE NEGATIVE Final   Influenza B by PCR NEGATIVE NEGATIVE Final    Comment: (NOTE) The Xpert Xpress SARS-CoV-2/FLU/RSV plus assay is intended as an aid in the diagnosis of influenza from Nasopharyngeal swab specimens and should not  be used as a sole basis for treatment. Nasal washings and aspirates are unacceptable for Xpert Xpress SARS-CoV-2/FLU/RSV testing.  Fact Sheet for Patients: EntrepreneurPulse.com.au  Fact Sheet for Healthcare Providers: IncredibleEmployment.be  This test is not yet approved or cleared by the [place] FDA and has been authorized for detection and/or diagnosis of SARS-CoV-2 by FDA under an Emergency Use Authorization (EUA). This EUA will remain in effect (meaning this test can be used) for the duration of the COVID-19 declaration under Section 564(b)(1) of the Act, 21 U.S.C. section 360bbb-3(b)(1), unless the authorization is terminated  or revoked.     Resp Syncytial Virus by PCR NEGATIVE NEGATIVE Final    Comment: (NOTE) Fact Sheet for Patients: EntrepreneurPulse.com.au  Fact Sheet for Healthcare Providers: IncredibleEmployment.be  This test is not yet approved or cleared by the [place] FDA and has been authorized for detection and/or diagnosis of SARS-CoV-2 by FDA under an Emergency Use Authorization (EUA). This EUA will remain in effect (meaning this test can be used) for the duration of the COVID-19 declaration under Section 564(b)(1) of the Act, 21 U.S.C. section 360bbb-3(b)(1), unless the authorization is terminated or revoked.  Performed at [facility], [address] [place], [place] [address]     Labs: BNP (last 3 results) No results for input(s): "BNP" in the last 8760 hours. Basic Metabolic Panel: Recent Labs  Lab 06/05/22 0018 06/05/22 0542 06/06/22 0314 06/07/22 0515  NA 126* 127* 131* 133*  K 3.4* 4.7 3.7 3.8  CL 89* 88* 98 99  CO2 '26 26 26 27  '$ GLUCOSE 101* 139* 130* 131*  BUN 5* 5* 11 16  CREATININE 0.75 0.84 0.85 0.85  CALCIUM 8.6* 9.0 8.4* 8.5*  MG  --  1.7 1.9 1.5*  PHOS  --   --   --  2.7   Liver Function Tests: Recent Labs  Lab 06/05/22 0542  AST 56*  ALT  24  ALKPHOS 175*  BILITOT 1.5*  PROT 8.3*  ALBUMIN 4.2   No results for input(s): "LIPASE", "AMYLASE" in the last 168 hours. No results for input(s): "AMMONIA" in the last 168 hours. CBC: Recent Labs  Lab 06/05/22 0018 06/05/22 0542  WBC 6.1 4.8  NEUTROABS  --  4.5  HGB 16.4 17.7*  HCT 47.5 50.0  MCV 104.6* 102.5*  PLT 172 196   Cardiac Enzymes: No results for input(s): "CKTOTAL", "CKMB", "CKMBINDEX", "TROPONINI" in the last 168 hours. BNP: Invalid input(s): "POCBNP" CBG: No results for input(s): "GLUCAP" in the last 168 hours. D-Dimer No results for input(s): "DDIMER" in the last 72 hours. Hgb A1c No results for input(s): "HGBA1C" in the last 72 hours. Lipid Profile No results for input(s): "CHOL", "HDL", "LDLCALC", "TRIG", "CHOLHDL", "LDLDIRECT" in the last 72 hours. Thyroid function studies No results for input(s): "TSH", "T4TOTAL", "T3FREE", "THYROIDAB" in the last 72 hours.  Invalid input(s): "FREET3" Anemia work up No results for input(s): "VITAMINB12", "FOLATE", "FERRITIN", "TIBC", "IRON", "RETICCTPCT" in the last 72 hours. Urinalysis    Component Value Date/Time   COLORURINE AMBER (A) 05/21/2016 1145   APPEARANCEUR CLEAR 05/21/2016 1145   LABSPEC <1.005 (L) 05/21/2016 1145   PHURINE 6.0 05/21/2016 1145   GLUCOSEU NEGATIVE 05/21/2016 1145   HGBUR NEGATIVE 05/21/2016 1145   BILIRUBINUR NEGATIVE 05/21/2016 1145   KETONESUR NEGATIVE 05/21/2016 1145   PROTEINUR NEGATIVE 05/21/2016 1145   NITRITE NEGATIVE 05/21/2016 1145   LEUKOCYTESUR NEGATIVE 05/21/2016 1145   Sepsis Labs Recent Labs  Lab 06/05/22 0018 06/05/22 0542  WBC 6.1 4.8   Microbiology Recent Results (from the past 240 hour(s))  Resp panel by RT-PCR (RSV, Flu A&B, Covid) Anterior Nasal Swab     Status: None   Collection Time: 06/05/22 12:08 AM   Specimen: Anterior Nasal Swab  Result Value Ref Range Status   SARS Coronavirus 2 by RT PCR NEGATIVE NEGATIVE Final    Comment:  (NOTE) SARS-CoV-2 target nucleic acids are NOT DETECTED.  The SARS-CoV-2 RNA is generally detectable in upper respiratory specimens during the acute phase of infection. The lowest concentration of SARS-CoV-2 viral copies this assay can  detect is 138 copies/mL. A negative result does not preclude SARS-Cov-2 infection and should not be used as the sole basis for treatment or other patient management decisions. A negative result may occur with  improper specimen collection/handling, submission of specimen other than nasopharyngeal swab, presence of viral mutation(s) within the areas targeted by this assay, and inadequate number of viral copies(<138 copies/mL). A negative result must be combined with clinical observations, patient history, and epidemiological information. The expected result is Negative.  Fact Sheet for Patients:  EntrepreneurPulse.com.au  Fact Sheet for Healthcare Providers:  IncredibleEmployment.be  This test is no t yet approved or cleared by the [place] FDA and  has been authorized for detection and/or diagnosis of SARS-CoV-2 by FDA under an Emergency Use Authorization (EUA). This EUA will remain  in effect (meaning this test can be used) for the duration of the COVID-19 declaration under Section 564(b)(1) of the Act, 21 U.S.C.section 360bbb-3(b)(1), unless the authorization is terminated  or revoked sooner.       Influenza A by PCR NEGATIVE NEGATIVE Final   Influenza B by PCR NEGATIVE NEGATIVE Final    Comment: (NOTE) The Xpert Xpress SARS-CoV-2/FLU/RSV plus assay is intended as an aid in the diagnosis of influenza from Nasopharyngeal swab specimens and should not be used as a sole basis for treatment. Nasal washings and aspirates are unacceptable for Xpert Xpress SARS-CoV-2/FLU/RSV testing.  Fact Sheet for Patients: EntrepreneurPulse.com.au  Fact Sheet for Healthcare  Providers: IncredibleEmployment.be  This test is not yet approved or cleared by the [place] FDA and has been authorized for detection and/or diagnosis of SARS-CoV-2 by FDA under an Emergency Use Authorization (EUA). This EUA will remain in effect (meaning this test can be used) for the duration of the COVID-19 declaration under Section 564(b)(1) of the Act, 21 U.S.C. section 360bbb-3(b)(1), unless the authorization is terminated or revoked.     Resp Syncytial Virus by PCR NEGATIVE NEGATIVE Final    Comment: (NOTE) Fact Sheet for Patients: EntrepreneurPulse.com.au  Fact Sheet for Healthcare Providers: IncredibleEmployment.be  This test is not yet approved or cleared by the [place] FDA and has been authorized for detection and/or diagnosis of SARS-CoV-2 by FDA under an Emergency Use Authorization (EUA). This EUA will remain in effect (meaning this test can be used) for the duration of the COVID-19 declaration under Section 564(b)(1) of the Act, 21 U.S.C. section 360bbb-3(b)(1), unless the authorization is terminated or revoked.  Performed at [facility], [address] [place], [place] [address]    Time coordinating discharge: 37 mins  SIGNED:  [place] Brakeman, MD  [facility] 06/07/2022, 5:58 PM How to contact the [facility] Attending or Consulting provider [place] or covering provider during after hours [place], for this patient?  Check the care team in [facility] and look for a) attending/consulting [facility] provider listed and b) the [facility] team listed Log into [email or web address] and use [place]'s universal password to access. If you do not have the password, please contact the hospital operator. Locate the [facility] provider you are looking for under [facility] and page to a number that you can be directly reached. If you still have difficulty reaching the provider, please page the [facility] (Director on Call) for the  Hospitalists listed on amion for assistance.

## 2022-08-15 ENCOUNTER — Other Ambulatory Visit: Payer: Self-pay

## 2022-08-15 ENCOUNTER — Inpatient Hospital Stay (HOSPITAL_COMMUNITY)
Admission: EM | Admit: 2022-08-15 | Discharge: 2022-08-17 | DRG: 190 | Disposition: A | Discharge status: Home / Self Care | Payer: Medicaid Other | Attending: Family Medicine | Admitting: Family Medicine

## 2022-08-15 ENCOUNTER — Encounter (HOSPITAL_COMMUNITY): Payer: Self-pay

## 2022-08-15 ENCOUNTER — Emergency Department (HOSPITAL_COMMUNITY): Payer: Medicaid Other

## 2022-08-15 DIAGNOSIS — Z1152 Encounter for screening for COVID-19: Secondary | ICD-10-CM

## 2022-08-15 DIAGNOSIS — Z825 Family history of asthma and other chronic lower respiratory diseases: Secondary | ICD-10-CM

## 2022-08-15 DIAGNOSIS — E871 Hypo-osmolality and hyponatremia: Secondary | ICD-10-CM | POA: Diagnosis present

## 2022-08-15 DIAGNOSIS — K709 Alcoholic liver disease, unspecified: Secondary | ICD-10-CM | POA: Diagnosis present

## 2022-08-15 DIAGNOSIS — E440 Moderate protein-calorie malnutrition: Secondary | ICD-10-CM | POA: Diagnosis present

## 2022-08-15 DIAGNOSIS — F17210 Nicotine dependence, cigarettes, uncomplicated: Secondary | ICD-10-CM | POA: Diagnosis present

## 2022-08-15 DIAGNOSIS — F17200 Nicotine dependence, unspecified, uncomplicated: Secondary | ICD-10-CM | POA: Diagnosis not present

## 2022-08-15 DIAGNOSIS — Z7951 Long term (current) use of inhaled steroids: Secondary | ICD-10-CM | POA: Diagnosis not present

## 2022-08-15 DIAGNOSIS — I10 Essential (primary) hypertension: Secondary | ICD-10-CM | POA: Diagnosis present

## 2022-08-15 DIAGNOSIS — F1029 Alcohol dependence with unspecified alcohol-induced disorder: Secondary | ICD-10-CM | POA: Diagnosis not present

## 2022-08-15 DIAGNOSIS — K219 Gastro-esophageal reflux disease without esophagitis: Secondary | ICD-10-CM | POA: Diagnosis present

## 2022-08-15 DIAGNOSIS — J9621 Acute and chronic respiratory failure with hypoxia: Secondary | ICD-10-CM | POA: Diagnosis present

## 2022-08-15 DIAGNOSIS — R17 Unspecified jaundice: Secondary | ICD-10-CM | POA: Diagnosis present

## 2022-08-15 DIAGNOSIS — J441 Chronic obstructive pulmonary disease with (acute) exacerbation: Secondary | ICD-10-CM | POA: Diagnosis present

## 2022-08-15 DIAGNOSIS — F1020 Alcohol dependence, uncomplicated: Secondary | ICD-10-CM | POA: Diagnosis present

## 2022-08-15 DIAGNOSIS — J45901 Unspecified asthma with (acute) exacerbation: Secondary | ICD-10-CM | POA: Diagnosis present

## 2022-08-15 DIAGNOSIS — J449 Chronic obstructive pulmonary disease, unspecified: Principal | ICD-10-CM

## 2022-08-15 DIAGNOSIS — R0602 Shortness of breath: Secondary | ICD-10-CM | POA: Diagnosis present

## 2022-08-15 DIAGNOSIS — Z79899 Other long term (current) drug therapy: Secondary | ICD-10-CM

## 2022-08-15 LAB — CBC WITH DIFFERENTIAL/PLATELET
Abs Immature Granulocytes: 0.03 10*3/uL (ref 0.00–0.07)
Basophils Absolute: 0.1 10*3/uL (ref 0.0–0.1)
Basophils Relative: 1 %
Eosinophils Absolute: 0.7 10*3/uL — ABNORMAL HIGH (ref 0.0–0.5)
Eosinophils Relative: 10 %
HCT: 45.3 % (ref 39.0–52.0)
Hemoglobin: 16.2 g/dL (ref 13.0–17.0)
Immature Granulocytes: 1 %
Lymphocytes Relative: 22 %
Lymphs Abs: 1.4 10*3/uL (ref 0.7–4.0)
MCH: 36.6 pg — ABNORMAL HIGH (ref 26.0–34.0)
MCHC: 35.8 g/dL (ref 30.0–36.0)
MCV: 102.3 fL — ABNORMAL HIGH (ref 80.0–100.0)
Monocytes Absolute: 0.7 10*3/uL (ref 0.1–1.0)
Monocytes Relative: 10 %
Neutro Abs: 3.7 10*3/uL (ref 1.7–7.7)
Neutrophils Relative %: 56 %
Platelets: 175 10*3/uL (ref 150–400)
RBC: 4.43 MIL/uL (ref 4.22–5.81)
RDW: 13.2 % (ref 11.5–15.5)
WBC: 6.5 10*3/uL (ref 4.0–10.5)
nRBC: 0 % (ref 0.0–0.2)

## 2022-08-15 LAB — URINALYSIS, W/ REFLEX TO CULTURE (INFECTION SUSPECTED)
Bacteria, UA: NONE SEEN
Bilirubin Urine: NEGATIVE
Glucose, UA: NEGATIVE mg/dL
Ketones, ur: NEGATIVE mg/dL
Leukocytes,Ua: NEGATIVE
Nitrite: NEGATIVE
Protein, ur: NEGATIVE mg/dL
Specific Gravity, Urine: 1.003 — ABNORMAL LOW (ref 1.005–1.030)
pH: 6 (ref 5.0–8.0)

## 2022-08-15 LAB — COMPREHENSIVE METABOLIC PANEL
ALT: 30 U/L (ref 0–44)
AST: 56 U/L — ABNORMAL HIGH (ref 15–41)
Albumin: 3.9 g/dL (ref 3.5–5.0)
Alkaline Phosphatase: 110 U/L (ref 38–126)
Anion gap: 12 (ref 5–15)
BUN: 6 mg/dL — ABNORMAL LOW (ref 8–23)
CO2: 23 mmol/L (ref 22–32)
Calcium: 8.2 mg/dL — ABNORMAL LOW (ref 8.9–10.3)
Chloride: 81 mmol/L — ABNORMAL LOW (ref 98–111)
Creatinine, Ser: 0.74 mg/dL (ref 0.61–1.24)
GFR, Estimated: >60 mL/min (ref >60)
Glucose, Bld: 78 mg/dL (ref 70–99)
Potassium: 3.6 mmol/L (ref 3.5–5.1)
Sodium: 116 mmol/L — CL (ref 135–145)
Total Bilirubin: 1.6 mg/dL — ABNORMAL HIGH (ref 0.3–1.2)
Total Protein: 7.7 g/dL (ref 6.5–8.1)

## 2022-08-15 LAB — PROTIME-INR
INR: 0.9 (ref 0.8–1.2)
Prothrombin Time: 12.3 seconds (ref 11.4–15.2)

## 2022-08-15 LAB — RESP PANEL BY RT-PCR (RSV, FLU A&B, COVID)  RVPGX2
Influenza A by PCR: NEGATIVE
Influenza B by PCR: NEGATIVE
Resp Syncytial Virus by PCR: NEGATIVE
SARS Coronavirus 2 by RT PCR: NEGATIVE

## 2022-08-15 LAB — APTT: aPTT: 32 seconds (ref 24–36)

## 2022-08-15 LAB — MRSA NEXT GEN BY PCR, NASAL: MRSA by PCR Next Gen: NOT DETECTED

## 2022-08-15 LAB — LACTIC ACID, PLASMA
Lactic Acid, Venous: 2.8 mmol/L (ref 0.5–1.9)
Lactic Acid, Venous: 2.4 mmol/L (ref 0.5–1.9)

## 2022-08-15 MED ORDER — IPRATROPIUM-ALBUTEROL 0.5-2.5 (3) MG/3ML IN SOLN: 3.0000 mL | RESPIRATORY_TRACT | Status: DC | PRN

## 2022-08-15 MED ORDER — SODIUM CHLORIDE 0.9 % IV SOLN: INTRAVENOUS | Status: DC

## 2022-08-15 MED ORDER — LORAZEPAM 1 MG PO TABS: 1.0000 mg | ORAL_TABLET | ORAL | Status: DC | PRN

## 2022-08-15 MED ORDER — ONDANSETRON HCL 4 MG PO TABS: 4.0000 mg | ORAL_TABLET | Freq: Four times a day (QID) | ORAL | Status: DC | PRN

## 2022-08-15 MED ORDER — PREDNISONE 20 MG PO TABS
40.0000 mg | ORAL_TABLET | Freq: Every day | ORAL | Status: DC
  Administered 2022-08-16 – 2022-08-17 (×2): 40 mg via ORAL
  Filled 2022-08-15 (×2): qty 2

## 2022-08-15 MED ORDER — FOLIC ACID 1 MG PO TABS
1.0000 mg | ORAL_TABLET | Freq: Every day | ORAL | Status: DC
  Administered 2022-08-16 – 2022-08-17 (×2): 1 mg via ORAL
  Filled 2022-08-15 (×2): qty 1

## 2022-08-15 MED ORDER — ADULT MULTIVITAMIN W/MINERALS CH
1.0000 | ORAL_TABLET | Freq: Every day | ORAL | Status: DC
  Administered 2022-08-15 – 2022-08-17 (×3): 1 via ORAL
  Filled 2022-08-15 (×3): qty 1

## 2022-08-15 MED ORDER — SODIUM CHLORIDE 0.9 % IV SOLN
500.0000 mg | INTRAVENOUS | Status: DC
  Administered 2022-08-15 – 2022-08-16 (×2): 500 mg via INTRAVENOUS
  Filled 2022-08-15 (×2): qty 5

## 2022-08-15 MED ORDER — ENOXAPARIN SODIUM 40 MG/0.4ML IJ SOSY
40.0000 mg | PREFILLED_SYRINGE | INTRAMUSCULAR | Status: DC
  Administered 2022-08-15 – 2022-08-16 (×2): 40 mg via SUBCUTANEOUS
  Filled 2022-08-15 (×2): qty 0.4

## 2022-08-15 MED ORDER — PANTOPRAZOLE SODIUM 40 MG PO TBEC
40.0000 mg | DELAYED_RELEASE_TABLET | Freq: Every day | ORAL | Status: DC
  Administered 2022-08-16 – 2022-08-17 (×2): 40 mg via ORAL
  Filled 2022-08-15 (×2): qty 1

## 2022-08-15 MED ORDER — FOLIC ACID 1 MG PO TABS: 1.0000 mg | ORAL_TABLET | Freq: Every day | ORAL | Status: DC

## 2022-08-15 MED ORDER — THIAMINE MONONITRATE 100 MG PO TABS
100.0000 mg | ORAL_TABLET | Freq: Every day | ORAL | Status: DC
  Administered 2022-08-15 – 2022-08-17 (×3): 100 mg via ORAL
  Filled 2022-08-15 (×2): qty 1

## 2022-08-15 MED ORDER — POLYETHYLENE GLYCOL 3350 17 G PO PACK: 17.0000 g | PACK | Freq: Every day | ORAL | Status: DC | PRN

## 2022-08-15 MED ORDER — TRAZODONE HCL 50 MG PO TABS: 25.0000 mg | ORAL_TABLET | Freq: Every evening | ORAL | Status: DC | PRN

## 2022-08-15 MED ORDER — ONDANSETRON HCL 4 MG/2ML IJ SOLN: 4.0000 mg | Freq: Four times a day (QID) | INTRAMUSCULAR | Status: DC | PRN

## 2022-08-15 MED ORDER — METHYLPREDNISOLONE SODIUM SUCC 125 MG IJ SOLR
125.0000 mg | Freq: Once | INTRAMUSCULAR | Status: AC
  Administered 2022-08-15: 125 mg via INTRAVENOUS
  Filled 2022-08-15: qty 2

## 2022-08-15 MED ORDER — AMLODIPINE BESYLATE 5 MG PO TABS
10.0000 mg | ORAL_TABLET | Freq: Every day | ORAL | Status: DC
  Administered 2022-08-15 – 2022-08-17 (×3): 10 mg via ORAL
  Filled 2022-08-15 (×3): qty 2

## 2022-08-15 MED ORDER — NICOTINE 21 MG/24HR TD PT24
21.0000 mg | MEDICATED_PATCH | Freq: Every day | TRANSDERMAL | Status: DC
  Administered 2022-08-15 – 2022-08-17 (×3): 21 mg via TRANSDERMAL
  Filled 2022-08-15 (×3): qty 1

## 2022-08-15 MED ORDER — LACTATED RINGERS IV SOLN: INTRAVENOUS | Status: DC

## 2022-08-15 MED ORDER — MAGNESIUM SULFATE 2 GM/50ML IV SOLN
2.0000 g | INTRAVENOUS | Status: AC
  Administered 2022-08-15: 2 g via INTRAVENOUS
  Filled 2022-08-15: qty 50

## 2022-08-15 MED ORDER — THIAMINE HCL 100 MG/ML IJ SOLN: 100.0000 mg | Freq: Every day | INTRAMUSCULAR | Status: DC

## 2022-08-15 MED ORDER — SODIUM CHLORIDE 0.9 % IV SOLN
2.0000 g | INTRAVENOUS | Status: DC
  Administered 2022-08-15 – 2022-08-16 (×2): 2 g via INTRAVENOUS
  Filled 2022-08-15 (×2): qty 20

## 2022-08-15 MED ORDER — IBUPROFEN 400 MG PO TABS: 400.0000 mg | ORAL_TABLET | Freq: Four times a day (QID) | ORAL | Status: DC | PRN

## 2022-08-15 MED ORDER — ALBUTEROL (5 MG/ML) CONTINUOUS INHALATION SOLN
10.0000 mg | INHALATION_SOLUTION | RESPIRATORY_TRACT | Status: AC
  Administered 2022-08-15: 10 mg via RESPIRATORY_TRACT
  Filled 2022-08-15: qty 20

## 2022-08-15 MED ORDER — MOMETASONE FURO-FORMOTEROL FUM 200-5 MCG/ACT IN AERO
2.0000 | INHALATION_SPRAY | Freq: Two times a day (BID) | RESPIRATORY_TRACT | Status: DC
  Administered 2022-08-15 – 2022-08-17 (×4): 2 via RESPIRATORY_TRACT
  Filled 2022-08-15: qty 8.8

## 2022-08-15 MED ORDER — OXYCODONE HCL 5 MG PO TABS: 5.0000 mg | ORAL_TABLET | ORAL | Status: DC | PRN

## 2022-08-15 MED ORDER — ATORVASTATIN CALCIUM 10 MG PO TABS
10.0000 mg | ORAL_TABLET | Freq: Every day | ORAL | Status: DC
  Administered 2022-08-16 – 2022-08-17 (×2): 10 mg via ORAL
  Filled 2022-08-15 (×2): qty 1

## 2022-08-15 MED ORDER — CHLORHEXIDINE GLUCONATE CLOTH 2 % EX PADS
6.0000 | MEDICATED_PAD | Freq: Every day | CUTANEOUS | Status: DC
  Administered 2022-08-15: 6 via TOPICAL

## 2022-08-15 MED ORDER — METOPROLOL TARTRATE 5 MG/5ML IV SOLN: 5.0000 mg | Freq: Four times a day (QID) | INTRAVENOUS | Status: DC | PRN

## 2022-08-15 MED ORDER — ORAL CARE MOUTH RINSE: 15.0000 mL | OROMUCOSAL | Status: DC | PRN

## 2022-08-15 MED ORDER — UMECLIDINIUM BROMIDE 62.5 MCG/ACT IN AEPB
1.0000 | INHALATION_SPRAY | Freq: Every day | RESPIRATORY_TRACT | Status: DC
  Administered 2022-08-16 – 2022-08-17 (×2): 1 via RESPIRATORY_TRACT
  Filled 2022-08-15: qty 7

## 2022-08-15 NOTE — Assessment & Plan Note (Signed)
Continue amlodipine Lopressor as needed

## 2022-08-15 NOTE — Sepsis Progress Note (Signed)
eLink is following this Code Sepsis. °

## 2022-08-15 NOTE — Assessment & Plan Note (Signed)
Oxygen As above for COPD

## 2022-08-15 NOTE — Assessment & Plan Note (Signed)
Encouraged healthy diet. 

## 2022-08-15 NOTE — Assessment & Plan Note (Addendum)
CIWA protocol in hospital  Thiamine and folate repletion Pt strongly advised to stop all alcohol consumption Resources provided for voluntary treatment

## 2022-08-15 NOTE — Plan of Care (Signed)

## 2022-08-15 NOTE — Hospital Course (Addendum)
Patient is a 62 year old with history of asthma plus COPD and ongoing tobacco use, GERD, HTN, ongoing alcohol use who presents to the ED complaining of shortness of breath.  He is frequently admitted for few days and started on steroids until he improves and is able to go home.  He reports taking his meds as directed including his inhalers at home.  When he presented to the ED he was noted to have oxygen saturation in the low 80s and was given continuous nebs, Solu-Medrol, and oxygen therapy.  Patient is also noted to be markedly hyponatremic with a sodium of 116.

## 2022-08-15 NOTE — Assessment & Plan Note (Signed)
Treated with Symbicort Treated with Solu-Medrol plus prednisone DuoNebs He is clinically much improved, ambulating; no SOB complaints DC home on oral prednisone, bronchodilators, doxycycline

## 2022-08-15 NOTE — Assessment & Plan Note (Signed)
Continue PPI ?

## 2022-08-15 NOTE — ED Provider Notes (Signed)
[facility] Provider Note   CSN: [id number] Arrival date & time: 08/15/22  1654     History  Chief Complaint  Patient presents with   Shortness of Breath    Jesus Walker is a 62 y.o. male.   Shortness of Breath  This patient is a 62 year old male history of asthma, history of COPD, after review of the medical record it appears that the patient has been admitted to the hospital multiple times for COPD exacerbation and respiratory failure most recently January 2024.  He has known to have COPD he has not oxygen dependent, he does not use inhaler treatments frequently.  He reports having shortness of breath for several days gradually worsening, feeling some discomfort in the left side of his chest with coughing and breathing    Home Medications Prior to Admission medications   Medication Sig Start Date End Date Taking? Authorizing Provider  acetaminophen (TYLENOL) 500 MG tablet Take 500 mg by mouth every 6 (six) hours as needed for mild pain.    [provider]  albuterol (PROVENTIL) (2.5 MG/3ML) 0.083% nebulizer solution Take 3 mLs (2.5 mg total) by nebulization 4 (four) times daily. 04/04/22   Danford, Suann Larry, MD  amLODipine (NORVASC) 10 MG tablet Take 10 mg by mouth daily. 05/31/21   [provider]  atorvastatin (LIPITOR) 10 MG tablet Take 10 mg by mouth daily. 03/04/22   [provider]  budesonide-formoterol (SYMBICORT) 160-4.5 MCG/ACT inhaler Inhale 2 puffs into the lungs 2 (two) times daily. 06/14/18   Barton Dubois, MD  folic acid (FOLVITE) 1 MG tablet Take 1 mg by mouth daily. 05/01/20   [provider]  ipratropium-albuterol (DUONEB) 0.5-2.5 (3) MG/3ML SOLN Take 3 mLs by nebulization every 4 (four) hours as needed (SOB/wheezing). 04/10/22   [provider]  omeprazole (PRILOSEC) 20 MG capsule Take 20 mg by mouth daily.    [provider]  PROAIR HFA 108 [phone] Base) MCG/ACT  inhaler Inhale 2 puffs into the lungs 4 (four) times daily. 12/26/19   [provider]  thiamine (VITAMIN B-1) 100 MG tablet Take 1 tablet (100 mg total) by mouth daily. 06/08/22   Murlean Iba, MD      Allergies    Patient has no known allergies.    Review of Systems   Review of Systems  Respiratory:  Positive for shortness of breath.   All other systems reviewed and are negative.   Physical Exam Updated Vital Signs BP (!) 155/65 (BP Location: Right Arm)   Pulse 84   Temp 97.8 F (36.6 C) (Oral)   Resp 18   Ht 1.575 m (5\' 2" )   Wt 61.2 kg   SpO2 (!) 88%   BMI 24.69 kg/m  Physical Exam Vitals and nursing note reviewed.  Constitutional:      General: He is in acute distress.     Appearance: He is well-developed.  HENT:     Head: Normocephalic and atraumatic.     Mouth/Throat:     Mouth: Mucous membranes are moist.     Pharynx: No oropharyngeal exudate.  Eyes:     General: No scleral icterus.       Right eye: No discharge.        Left eye: No discharge.     Conjunctiva/sclera: Conjunctivae normal.     Pupils: Pupils are equal, round, and reactive to light.  Neck:     Thyroid: No thyromegaly.  Vascular: No JVD.  Cardiovascular:     Rate and Rhythm: Regular rhythm. Tachycardia present.     Heart sounds: Normal heart sounds. No murmur heard.    No friction rub. No gallop.  Pulmonary:     Effort: Respiratory distress present.     Breath sounds: Wheezing and rales present.     Comments: Decreased breath sounds on the left Abdominal:     General: Bowel sounds are normal. There is no distension.     Palpations: Abdomen is soft. There is no mass.     Tenderness: There is no abdominal tenderness.  Musculoskeletal:        General: No tenderness. Normal range of motion.     Cervical back: Normal range of motion and neck supple.     Right lower leg: Edema present.     Left lower leg: Edema present.  Lymphadenopathy:     Cervical: No cervical adenopathy.   Skin:    General: Skin is warm and dry.     Findings: No erythema or rash.  Neurological:     Mental Status: He is alert.     Coordination: Coordination normal.  Psychiatric:        Behavior: Behavior normal.     ED Results / Procedures / Treatments   Labs (all labs ordered are listed, but only abnormal results are displayed) Labs Reviewed  LACTIC ACID, PLASMA - Abnormal; Notable for the following components:      Result Value   Lactic Acid, Venous 2.8 (*)    All other components within normal limits  COMPREHENSIVE METABOLIC PANEL - Abnormal; Notable for the following components:   Sodium 116 (*)    Chloride 81 (*)    BUN 6 (*)    Calcium 8.2 (*)    AST 56 (*)    Total Bilirubin 1.6 (*)    All other components within normal limits  CBC WITH DIFFERENTIAL/PLATELET - Abnormal; Notable for the following components:   MCV 102.3 (*)    MCH 36.6 (*)    Eosinophils Absolute 0.7 (*)    All other components within normal limits  RESP PANEL BY RT-PCR (RSV, FLU A&B, COVID)  RVPGX2  CULTURE, BLOOD (ROUTINE X 2)  CULTURE, BLOOD (ROUTINE X 2)  PROTIME-INR  APTT  LACTIC ACID, PLASMA  URINALYSIS, W/ REFLEX TO CULTURE (INFECTION SUSPECTED)    EKG EKG Interpretation  Date/Time:  Friday August 15 2022 17:10:21 EDT Ventricular Rate:  89 PR Interval:  136 QRS Duration: 83 QT Interval:  374 QTC Calculation: 456 R Axis:   58 Text Interpretation: Sinus rhythm Atrial premature complex Probable left atrial enlargement Probable left ventricular hypertrophy ST elevation, consider inferior injury Confirmed by Noemi Chapel [phone]) on 08/15/2022 6:41:41 PM  Radiology DG Chest Port 1 View  Result Date: 08/15/2022 CLINICAL DATA:  Questionable sepsis, oxygen desaturation EXAM: PORTABLE CHEST 1 VIEW COMPARISON:  Portable exam 1712 hours compared to 06/05/2022 FINDINGS: Normal heart size, mediastinal contours, and pulmonary vascularity. Persistent elevation of RIGHT diaphragm with RIGHT basilar  atelectasis. Linear subsegmental atelectasis LEFT lower lobe. No acute infiltrate, pleural effusion, or pneumothorax. Osseous structures unremarkable. IMPRESSION: Chronic atelectasis RIGHT base. Electronically Signed   By: Lavonia Dana M.D.   On: 08/15/2022 17:18    Procedures .Critical Care  Performed by: Noemi Chapel, MD Authorized by: Noemi Chapel, MD   Critical care provider statement:    Critical care time (minutes):  45   Critical care time was exclusive of:  Separately billable  procedures and treating other patients and teaching time   Critical care was necessary to treat or prevent imminent or life-threatening deterioration of the following conditions:  Respiratory failure   Critical care was time spent personally by me on the following activities:  Development of treatment plan with patient or surrogate, discussions with consultants, evaluation of patient's response to treatment, examination of patient, obtaining history from patient or surrogate, review of old charts, re-evaluation of patient's condition, pulse oximetry, ordering and review of radiographic studies, ordering and review of laboratory studies and ordering and performing treatments and interventions   I assumed direction of critical care for this patient from another provider in my specialty: no     Care discussed with: admitting provider   Comments:           Medications Ordered in ED Medications  lactated ringers infusion ( Intravenous New Bag/Given 08/15/22 1839)  cefTRIAXone (ROCEPHIN) 2 g in sodium chloride 0.9 % 100 mL IVPB (0 g Intravenous Stopped 08/15/22 1836)  azithromycin (ZITHROMAX) 500 mg in sodium chloride 0.9 % 250 mL IVPB (500 mg Intravenous New Bag/Given 08/15/22 1843)  albuterol (PROVENTIL,VENTOLIN) solution continuous neb (has no administration in time range)  0.9 %  sodium chloride infusion (has no administration in time range)  methylPREDNISolone sodium succinate (SOLU-MEDROL) 125 mg/2 mL injection  125 mg (has no administration in time range)  magnesium sulfate IVPB 2 g 50 mL (has no administration in time range)    ED Course/ Medical Decision Making/ A&P                             Medical Decision Making Amount and/or Complexity of Data Reviewed Labs: ordered. Radiology: ordered. ECG/medicine tests: ordered.  Risk Prescription drug management. Decision regarding hospitalization.   This patient presents to the ED for concern of shortness of breath, paramedics gave this patient multiple breathing treatments prehospital, he was 84% on room air when they arrived, he is requiring supplemental oxygen, this involves an extensive number of treatment options, and is a complaint that carries with it a high risk of complications and morbidity.  The differential diagnosis includes COPD exacerbation, pneumothorax, empyema, severe COPD, pulmonary embolism, cardiac disease   Co morbidities that complicate the patient evaluation  Chronic COPD, concurrent tobacco use, hypertension   Additional history obtained:  Additional history obtained from paramedics and electronic medical record External records from outside source obtained and reviewed including multiple prior admissions for acute respiratory failure   Lab Tests:  I Ordered, and personally interpreted labs.  The pertinent results include: Severe hyponatremia, lactic acid of 2.8, CBC without leukocytosis or anemia, INR of 0.9   Imaging Studies ordered:  I ordered imaging studies including chest x-ray I independently visualized and interpreted imaging which showed atelectasis but no signs of acute infiltrate or pneumothorax I agree with the radiologist interpretation   Cardiac Monitoring: / EKG:  The patient was maintained on a cardiac monitor.  I personally viewed and interpreted the cardiac monitored which showed an underlying rhythm of: Borderline tachycardia   Consultations Obtained:  I requested consultation with  the last,  and discussed lab and imaging findings as well as pertinent plan - they recommend: Admit to the hospital for severe hyponatremia and acute on chronic hypoxic respiratory failure   Problem List / ED Course / Critical interventions / Medication management  Continuous albuterol, Solu-Medrol, saline, magnesiumReevaluation of the patient after these medicines showed that the patient  remained significantly ill I have reviewed the patients home medicines and have made adjustments as needed   Social Determinants of Health:  Chronic tobacco use and lung disease   Test / Admission - Considered:  Admit high level of care, critical care provided for severe hyponatremia and persistent respiratory hypoxic respiratory failure         Final Clinical Impression(s) / ED Diagnoses Final diagnoses:  COPD, severe ([place])  Acute on chronic respiratory failure with hypoxia ([place])  Hyponatremia    Rx / DC Orders ED Discharge Orders     None         Noemi Chapel, MD 08/15/22 1844

## 2022-08-15 NOTE — H&P (Signed)
History and Physical    Patient: Jesus Walker [id number] DOB: 01-30-1961 DOA: 08/15/2022 DOS: the patient was seen and examined on 08/15/2022 PCP: Carrolyn Meiers, MD  Patient coming from: Home  Chief Complaint:  Chief Complaint  Patient presents with   Shortness of Breath   HPI: Jesus Walker is a 62 y.o. male with medical history significant of  asthma plus COPD and ongoing tobacco use, GERD, HTN, ongoing alcohol use who presents to the ED complaining of shortness of breath.  He is frequently admitted for few days and started on steroids until he improves and is able to go home.  He reports taking his meds as directed including his inhalers at home.  When he presented to the ED he was noted to have oxygen saturation in the low 80s and was given continuous nebs, Solu-Medrol, and oxygen therapy.  Patient is also noted to be markedly hyponatremic with a sodium of 116. Review of Systems: As mentioned in the history of present illness. All other systems reviewed and are negative. Past Medical History:  Diagnosis Date   Alcohol dependence ([place])    Asthma    COPD (chronic obstructive pulmonary disease) ([place])    GERD (gastroesophageal reflux disease)    Hypertension    Substance abuse ([place])    Past Surgical History:  Procedure Laterality Date   BRONCHIAL BRUSHINGS  01/03/2016   Procedure: BRONCHIAL BRUSHINGS;  Surgeon: Sinda Du, MD;  [facility];  Service: Cardiopulmonary;;   BRONCHIAL WASHINGS  01/03/2016   Procedure: BRONCHIAL WASHINGS;  Surgeon: Sinda Du, MD;  [facility];  Service: Cardiopulmonary;;   FLEXIBLE BRONCHOSCOPY N/A 01/03/2016   Procedure: FLEXIBLE BRONCHOSCOPY;  Surgeon: Sinda Du, MD;  Location: [facility];  Service: Cardiopulmonary;  Laterality: N/A;   Social History:  reports that he has been smoking cigarettes. He has been smoking an average of 1 pack per day. He has never used smokeless tobacco. He reports current  alcohol use of about 12.0 standard drinks of alcohol per week. He reports that he does not use drugs.  No Known Allergies  Family History  Problem Relation Age of Onset   Asthma Mother 60   Asthma Father 62    Prior to Admission medications   Medication Sig Start Date End Date Taking? Authorizing Provider  acetaminophen (TYLENOL) 500 MG tablet Take 500 mg by mouth every 6 (six) hours as needed for mild pain.    [provider]  albuterol (PROVENTIL) (2.5 MG/3ML) 0.083% nebulizer solution Take 3 mLs (2.5 mg total) by nebulization 4 (four) times daily. 04/04/22   Danford, Suann Larry, MD  amLODipine (NORVASC) 10 MG tablet Take 10 mg by mouth daily. 05/31/21   [provider]  atorvastatin (LIPITOR) 10 MG tablet Take 10 mg by mouth daily. 03/04/22   [provider]  budesonide-formoterol (SYMBICORT) 160-4.5 MCG/ACT inhaler Inhale 2 puffs into the lungs 2 (two) times daily. 06/14/18   Barton Dubois, MD  folic acid (FOLVITE) 1 MG tablet Take 1 mg by mouth daily. 05/01/20   [provider]  ipratropium-albuterol (DUONEB) 0.5-2.5 (3) MG/3ML SOLN Take 3 mLs by nebulization every 4 (four) hours as needed (SOB/wheezing). 04/10/22   [provider]  omeprazole (PRILOSEC) 20 MG capsule Take 20 mg by mouth daily.    [provider]  PROAIR HFA 108 [phone] Base) MCG/ACT inhaler Inhale 2 puffs into the lungs 4 (four) times daily. 12/26/19   [provider]  thiamine (VITAMIN  B-1) 100 MG tablet Take 1 tablet (100 mg total) by mouth daily. 06/08/22   Murlean Iba, MD    Physical Exam: Vitals:   08/15/22 1930 08/15/22 2000 08/15/22 2027 08/15/22 2029  BP: (!) 121/94 127/66  (!) 147/56  Pulse: 96 95  97  Resp: 18 20  (!) 23  Temp:   (!) 97.5 F (36.4 C)   TempSrc:   Oral   SpO2: 100% 100%  96%  Weight:   56.5 kg   Height:   5\' 2"  (1.575 m)    Physical Examination: General appearance - alert, well appearing, and in no distress Neck -  supple, no significant adenopathy Chest -diffuse wheezing Heart - normal rate, regular rhythm, normal S1, S2, no murmurs, rubs, clicks or gallops Abdomen - soft, nontender, nondistended, no masses or organomegaly Extremities - bilateral edema Data Reviewed: Results for orders placed or performed during the hospital encounter of 08/15/22 (from the past 24 hour(s))  Resp panel by RT-PCR (RSV, Flu A&B, Covid) Anterior Nasal Swab     Status: None   Collection Time: 08/15/22  5:02 PM   Specimen: Anterior Nasal Swab  Result Value Ref Range   SARS Coronavirus 2 by RT PCR NEGATIVE NEGATIVE   Influenza A by PCR NEGATIVE NEGATIVE   Influenza B by PCR NEGATIVE NEGATIVE   Resp Syncytial Virus by PCR NEGATIVE NEGATIVE  Lactic acid, plasma     Status: Abnormal   Collection Time: 08/15/22  5:46 PM  Result Value Ref Range   Lactic Acid, Venous 2.8 (HH) 0.5 - 1.9 mmol/L  Comprehensive metabolic panel     Status: Abnormal   Collection Time: 08/15/22  5:46 PM  Result Value Ref Range   Sodium 116 (LL) 135 - 145 mmol/L   Potassium 3.6 3.5 - 5.1 mmol/L   Chloride 81 (L) 98 - 111 mmol/L   CO2 23 22 - 32 mmol/L   Glucose, Bld 78 70 - 99 mg/dL   BUN 6 (L) 8 - 23 mg/dL   Creatinine, Ser 0.74 0.61 - 1.24 mg/dL   Calcium 8.2 (L) 8.9 - 10.3 mg/dL   Total Protein 7.7 6.5 - 8.1 g/dL   Albumin 3.9 3.5 - 5.0 g/dL   AST 56 (H) 15 - 41 U/L   ALT 30 0 - 44 U/L   Alkaline Phosphatase 110 38 - 126 U/L   Total Bilirubin 1.6 (H) 0.3 - 1.2 mg/dL   GFR, Estimated >60 >60 mL/min   Anion gap 12 5 - 15  CBC with Differential     Status: Abnormal   Collection Time: 08/15/22  5:46 PM  Result Value Ref Range   WBC 6.5 4.0 - 10.5 K/uL   RBC 4.43 4.22 - 5.81 MIL/uL   Hemoglobin 16.2 13.0 - 17.0 g/dL   HCT 45.3 39.0 - 52.0 %   MCV 102.3 (H) 80.0 - 100.0 fL   MCH 36.6 (H) 26.0 - 34.0 pg   MCHC 35.8 30.0 - 36.0 g/dL   RDW 13.2 11.5 - 15.5 %   Platelets 175 150 - 400 K/uL   nRBC 0.0 0.0 - 0.2 %   Neutrophils Relative %  56 %   Neutro Abs 3.7 1.7 - 7.7 K/uL   Lymphocytes Relative 22 %   Lymphs Abs 1.4 0.7 - 4.0 K/uL   Monocytes Relative 10 %   Monocytes Absolute 0.7 0.1 - 1.0 K/uL   Eosinophils Relative 10 %   Eosinophils Absolute 0.7 (H) 0.0 - 0.5  K/uL   Basophils Relative 1 %   Basophils Absolute 0.1 0.0 - 0.1 K/uL   Immature Granulocytes 1 %   Abs Immature Granulocytes 0.03 0.00 - 0.07 K/uL  Protime-INR     Status: None   Collection Time: 08/15/22  5:46 PM  Result Value Ref Range   Prothrombin Time 12.3 11.4 - 15.2 seconds   INR 0.9 0.8 - 1.2  APTT     Status: None   Collection Time: 08/15/22  5:46 PM  Result Value Ref Range   aPTT 32 24 - 36 seconds  Blood Culture (routine x 2)     Status: None (Preliminary result)   Collection Time: 08/15/22  5:46 PM   Specimen: Right Antecubital; Blood  Result Value Ref Range   Specimen Description      RIGHT ANTECUBITAL BOTTLES DRAWN AEROBIC AND ANAEROBIC   Special Requests      Blood Culture adequate volume Performed at [facility], [address] [place], [place] [address]    Culture PENDING    Report Status PENDING   Blood Culture (routine x 2)     Status: None (Preliminary result)   Collection Time: 08/15/22  5:46 PM   Specimen: BLOOD RIGHT HAND  Result Value Ref Range   Specimen Description      BLOOD RIGHT HAND BOTTLES DRAWN AEROBIC AND ANAEROBIC   Special Requests      Blood Culture adequate volume Performed at [facility] [address], [place], [place] [address]    Culture PENDING    Report Status PENDING   Urinalysis, w/ Reflex to Culture (Infection Suspected) -Urine, Clean Catch     Status: Abnormal   Collection Time: 08/15/22  6:47 PM  Result Value Ref Range   Specimen Source URINE, CLEAN CATCH    Color, Urine STRAW (A) YELLOW   APPearance CLEAR CLEAR   Specific Gravity, Urine 1.003 (L) 1.005 - 1.030   pH 6.0 5.0 - 8.0   Glucose, UA NEGATIVE NEGATIVE mg/dL   Hgb urine dipstick SMALL (A) NEGATIVE   Bilirubin Urine NEGATIVE  NEGATIVE   Ketones, ur NEGATIVE NEGATIVE mg/dL   Protein, ur NEGATIVE NEGATIVE mg/dL   Nitrite NEGATIVE NEGATIVE   Leukocytes,Ua NEGATIVE NEGATIVE   RBC / HPF 0-5 0 - 5 RBC/hpf   WBC, UA 0-5 0 - 5 WBC/hpf   Bacteria, UA NONE SEEN NONE SEEN   Squamous Epithelial / HPF 0-5 0 - 5 /HPF  Lactic acid, plasma     Status: Abnormal   Collection Time: 08/15/22  7:23 PM  Result Value Ref Range   Lactic Acid, Venous 2.4 (HH) 0.5 - 1.9 mmol/L  MRSA Next Gen by PCR, Nasal     Status: None   Collection Time: 08/15/22  8:24 PM   Specimen: Nasal Mucosa; Nasal Swab  Result Value Ref Range   MRSA by PCR Next Gen NOT DETECTED NOT DETECTED   DG Chest Port 1 View  Result Date: 08/15/2022 CLINICAL DATA:  Questionable sepsis, oxygen desaturation EXAM: PORTABLE CHEST 1 VIEW COMPARISON:  Portable exam 1712 hours compared to 06/05/2022 FINDINGS: Normal heart size, mediastinal contours, and pulmonary vascularity. Persistent elevation of RIGHT diaphragm with RIGHT basilar atelectasis. Linear subsegmental atelectasis LEFT lower lobe. No acute infiltrate, pleural effusion, or pneumothorax. Osseous structures unremarkable. IMPRESSION: Chronic atelectasis RIGHT base. Electronically Signed   By: Lavonia Dana M.D.   On: 08/15/2022 17:18     Assessment and Plan: No notes have been filed under this hospital service. Service:  Hospitalist     Advance Care Planning:   Code Status: Full Code   Consults: none  Family Communication: Patient at bedside  Severity of Illness: The appropriate patient status for this patient is INPATIENT. Inpatient status is judged to be reasonable and necessary in order to provide the required intensity of service to ensure the patient's safety. The patient's presenting symptoms, physical exam findings, and initial radiographic and laboratory data in the context of their chronic comorbidities is felt to place them at high risk for further clinical deterioration. Furthermore, it is not  anticipated that the patient will be medically stable for discharge from the hospital within 2 midnights of admission.   * I certify that at the point of admission it is my clinical judgment that the patient will require inpatient hospital care spanning beyond 2 midnights from the point of admission due to high intensity of service, high risk for further deterioration and high frequency of surveillance required.*  Author: Donnamae Jude, MD 08/15/2022 8:59 PM  For on call review [email or web address].

## 2022-08-15 NOTE — Assessment & Plan Note (Signed)
Nicotine patch Encourage smoking cessation

## 2022-08-15 NOTE — ED Triage Notes (Signed)
Pt c/o shortness of breath at home this morning. EMS reports pt's oxygen sats were 82-84% on room air. EMS reports they gave him a Duoneb and he came up to 100% while getting duo neb. Pt is 88% on room air upon arrival to ED.

## 2022-08-15 NOTE — Assessment & Plan Note (Signed)
Severe at 116, improving with IV fluid hydration  Likely related to ongoing heavy alcohol abuse This is previously been treated with IV normal saline and has improved with this treatment. Will continue IV normal saline

## 2022-08-15 NOTE — Assessment & Plan Note (Signed)
Secondary to ongoing alcohol abuse and subsequent liver damage

## 2022-08-16 DIAGNOSIS — E440 Moderate protein-calorie malnutrition: Secondary | ICD-10-CM | POA: Diagnosis not present

## 2022-08-16 DIAGNOSIS — J9621 Acute and chronic respiratory failure with hypoxia: Secondary | ICD-10-CM | POA: Diagnosis not present

## 2022-08-16 DIAGNOSIS — F1029 Alcohol dependence with unspecified alcohol-induced disorder: Secondary | ICD-10-CM

## 2022-08-16 DIAGNOSIS — R17 Unspecified jaundice: Secondary | ICD-10-CM

## 2022-08-16 DIAGNOSIS — F17200 Nicotine dependence, unspecified, uncomplicated: Secondary | ICD-10-CM

## 2022-08-16 DIAGNOSIS — K219 Gastro-esophageal reflux disease without esophagitis: Secondary | ICD-10-CM

## 2022-08-16 LAB — COMPREHENSIVE METABOLIC PANEL
ALT: 25 U/L (ref 0–44)
AST: 43 U/L — ABNORMAL HIGH (ref 15–41)
Albumin: 3.1 g/dL — ABNORMAL LOW (ref 3.5–5.0)
Alkaline Phosphatase: 88 U/L (ref 38–126)
Anion gap: 10 (ref 5–15)
BUN: 5 mg/dL — ABNORMAL LOW (ref 8–23)
CO2: 23 mmol/L (ref 22–32)
Calcium: 7.9 mg/dL — ABNORMAL LOW (ref 8.9–10.3)
Chloride: 95 mmol/L — ABNORMAL LOW (ref 98–111)
Creatinine, Ser: 0.74 mg/dL (ref 0.61–1.24)
GFR, Estimated: >60 mL/min (ref >60)
Glucose, Bld: 117 mg/dL — ABNORMAL HIGH (ref 70–99)
Potassium: 3.9 mmol/L (ref 3.5–5.1)
Sodium: 128 mmol/L — ABNORMAL LOW (ref 135–145)
Total Bilirubin: 1.3 mg/dL — ABNORMAL HIGH (ref 0.3–1.2)
Total Protein: 6.2 g/dL — ABNORMAL LOW (ref 6.5–8.1)

## 2022-08-16 LAB — MAGNESIUM: Magnesium: 1.9 mg/dL (ref 1.7–2.4)

## 2022-08-16 LAB — LACTIC ACID, PLASMA: Lactic Acid, Venous: 1.4 mmol/L (ref 0.5–1.9)

## 2022-08-16 MED ORDER — LIDOCAINE 5 % EX PTCH
1.0000 | MEDICATED_PATCH | Freq: Every day | CUTANEOUS | Status: DC
  Administered 2022-08-16 – 2022-08-17 (×2): 1 via TRANSDERMAL
  Filled 2022-08-16 (×3): qty 1

## 2022-08-16 MED ORDER — CHLORHEXIDINE GLUCONATE CLOTH 2 % EX PADS
6.0000 | MEDICATED_PAD | Freq: Every day | CUTANEOUS | Status: DC
  Administered 2022-08-16: 6 via TOPICAL

## 2022-08-16 MED ORDER — METOPROLOL TARTRATE 25 MG PO TABS
25.0000 mg | ORAL_TABLET | Freq: Two times a day (BID) | ORAL | Status: DC
  Administered 2022-08-16 – 2022-08-17 (×3): 25 mg via ORAL
  Filled 2022-08-16 (×3): qty 1

## 2022-08-16 NOTE — Hospital Course (Signed)
62 y.o. male with medical history significant of  asthma plus COPD and ongoing tobacco use, GERD, HTN, ongoing alcohol use who presents to the ED complaining of shortness of breath.  He is frequently admitted for few days and started on steroids until he improves and is able to go home.  He reports taking his meds as directed including his inhalers at home.  When he presented to the ED he was noted to have oxygen saturation in the low 80s and was given continuous nebs, Solu-Medrol, and oxygen therapy.  Patient is also noted to be markedly hyponatremic with a sodium of 116.

## 2022-08-16 NOTE — Plan of Care (Signed)

## 2022-08-16 NOTE — Progress Notes (Signed)
PROGRESS NOTE   Jesus Walker  [id number] DOB: [birth year] DOA: 08/15/2022 PCP: Carrolyn Meiers, MD   Chief Complaint  Patient presents with   Shortness of Breath   Level of care: Stepdown  Brief Admission History:   62 y.o. male with medical history significant of  asthma plus COPD and ongoing tobacco use, GERD, HTN, ongoing alcohol use who presents to the ED complaining of shortness of breath.  He is frequently admitted for few days and started on steroids until he improves and is able to go home.  He reports taking his meds as directed including his inhalers at home.  When he presented to the ED he was noted to have oxygen saturation in the low 80s and was given continuous nebs, Solu-Medrol, and oxygen therapy.  Patient is also noted to be markedly hyponatremic with a sodium of 116.   Assessment and Plan: * Acute on chronic respiratory failure with hypoxia (HCC) Oxygen As above for COPD  COPD exacerbation (HCC) Continue Symbicort Add Solu-Medrol plus prednisone DuoNebs Treated for community-acquired pneumonia though chest x-ray is negative Add Dulera Add Incruse Ellipta  GERD (gastroesophageal reflux disease) Continue PPI  HTN (hypertension) Continue amlodipine Lopressor as needed  Serum total bilirubin elevated Likely related to ongoing alcohol use  Malnutrition of moderate degree Encouraged healthy diet  Tobacco dependence Nicotine patch Encourage smoking cessation  Alcohol dependence ([place]) CIWA protocol Thiamine and folate repletion  Hyponatremia Severe at 116, improving with IV fluid hydration  Likely related to ongoing heavy alcohol abuse This is previously been treated with IV normal saline and has improved with this treatment. Will continue IV normal saline   DVT prophylaxis: enoxaparin Code Status: full  Family Communication:  Disposition: Status is: Inpatient Remains inpatient appropriate because: IV treatments and intensity of  illness   Consultants:   Procedures:   Antimicrobials:    Subjective: C/o of left sided pain on rib cage and near left flank.   Objective: Vitals:   08/16/22 0820 08/16/22 0900 08/16/22 0927 08/16/22 1103  BP:  135/63    Pulse:  (!) 108    Resp:  19    Temp: 98.1 F (36.7 C)   97.8 F (36.6 C)  TempSrc: Oral   Oral  SpO2:  96% 95%   Weight:      Height:        Intake/Output Summary (Last 24 hours) at 08/16/2022 1527 Last data filed at 08/16/2022 1000 Gross per 24 hour  Intake 4092.16 ml  Output 2225 ml  Net 1867.16 ml   Filed Weights   08/15/22 1708 08/15/22 2027 08/16/22 0501  Weight: 61.2 kg 56.5 kg 58 kg   Examination:  General exam: poorly groomed, emaciated appearance, awake, alert, Appears calm and comfortable  Respiratory system: Clear to auscultation. Respiratory effort normal. Cardiovascular system: tachycardic rate; normal S1 & S2 heard. No JVD, murmurs, rubs, gallops or clicks. No pedal edema. Gastrointestinal system: Abdomen is nondistended, soft and nontender. No organomegaly or masses felt. Normal bowel sounds heard. Central nervous system: Alert and oriented. No focal neurological deficits. Extremities: Symmetric 5 x 5 power. Skin: No rashes, lesions or ulcers. Psychiatry: Judgement and insight appear poor. Mood & affect appropriate.   Data Reviewed: I have personally reviewed following labs and imaging studies  CBC: Recent Labs  Lab 08/15/22 1746  WBC 6.5  NEUTROABS 3.7  HGB 16.2  HCT 45.3  MCV 102.3*  PLT 0000000    Basic Metabolic Panel: Recent Labs  Lab 08/15/22  1746 08/16/22 0302  NA 116* 128*  K 3.6 3.9  CL 81* 95*  CO2 23 23  GLUCOSE 78 117*  BUN 6* 5*  CREATININE 0.74 0.74  CALCIUM 8.2* 7.9*  MG  --  1.9    CBG: No results for input(s): "GLUCAP" in the last 168 hours.  Recent Results (from the past 240 hour(s))  Resp panel by RT-PCR (RSV, Flu A&B, Covid) Anterior Nasal Swab     Status: None   Collection Time: 08/15/22   5:02 PM   Specimen: Anterior Nasal Swab  Result Value Ref Range Status   SARS Coronavirus 2 by RT PCR NEGATIVE NEGATIVE Final    Comment: (NOTE) SARS-CoV-2 target nucleic acids are NOT DETECTED.  The SARS-CoV-2 RNA is generally detectable in upper respiratory specimens during the acute phase of infection. The lowest concentration of SARS-CoV-2 viral copies this assay can detect is 138 copies/mL. A negative result does not preclude SARS-Cov-2 infection and should not be used as the sole basis for treatment or other patient management decisions. A negative result may occur with  improper specimen collection/handling, submission of specimen other than nasopharyngeal swab, presence of viral mutation(s) within the areas targeted by this assay, and inadequate number of viral copies(<138 copies/mL). A negative result must be combined with clinical observations, patient history, and epidemiological information. The expected result is Negative.  Fact Sheet for Patients:  EntrepreneurPulse.com.au  Fact Sheet for Healthcare Providers:  IncredibleEmployment.be  This test is no t yet approved or cleared by the [place] FDA and  has been authorized for detection and/or diagnosis of SARS-CoV-2 by FDA under an Emergency Use Authorization (EUA). This EUA will remain  in effect (meaning this test can be used) for the duration of the COVID-19 declaration under Section 564(b)(1) of the Act, 21 U.S.C.section 360bbb-3(b)(1), unless the authorization is terminated  or revoked sooner.       Influenza A by PCR NEGATIVE NEGATIVE Final   Influenza B by PCR NEGATIVE NEGATIVE Final    Comment: (NOTE) The Xpert Xpress SARS-CoV-2/FLU/RSV plus assay is intended as an aid in the diagnosis of influenza from Nasopharyngeal swab specimens and should not be used as a sole basis for treatment. Nasal washings and aspirates are unacceptable for Xpert Xpress  SARS-CoV-2/FLU/RSV testing.  Fact Sheet for Patients: EntrepreneurPulse.com.au  Fact Sheet for Healthcare Providers: IncredibleEmployment.be  This test is not yet approved or cleared by the [place] FDA and has been authorized for detection and/or diagnosis of SARS-CoV-2 by FDA under an Emergency Use Authorization (EUA). This EUA will remain in effect (meaning this test can be used) for the duration of the COVID-19 declaration under Section 564(b)(1) of the Act, 21 U.S.C. section 360bbb-3(b)(1), unless the authorization is terminated or revoked.     Resp Syncytial Virus by PCR NEGATIVE NEGATIVE Final    Comment: (NOTE) Fact Sheet for Patients: EntrepreneurPulse.com.au  Fact Sheet for Healthcare Providers: IncredibleEmployment.be  This test is not yet approved or cleared by the [place] FDA and has been authorized for detection and/or diagnosis of SARS-CoV-2 by FDA under an Emergency Use Authorization (EUA). This EUA will remain in effect (meaning this test can be used) for the duration of the COVID-19 declaration under Section 564(b)(1) of the Act, 21 U.S.C. section 360bbb-3(b)(1), unless the authorization is terminated or revoked.  Performed at [facility], [address], [place], [place] [address]   Blood Culture (routine x 2)     Status: None (Preliminary result)   Collection Time:  08/15/22  5:46 PM   Specimen: Right Antecubital; Blood  Result Value Ref Range Status   Specimen Description   Final    RIGHT ANTECUBITAL BOTTLES DRAWN AEROBIC AND ANAEROBIC   Special Requests Blood Culture adequate volume  Final   Culture   Final    NO GROWTH < 12 HOURS Performed at [facility], [address] [place], [place] [address]    Report Status PENDING  Incomplete  Blood Culture (routine x 2)     Status: None (Preliminary result)   Collection Time: 08/15/22  5:46 PM   Specimen: BLOOD RIGHT HAND   Result Value Ref Range Status   Specimen Description   Final    BLOOD RIGHT HAND BOTTLES DRAWN AEROBIC AND ANAEROBIC   Special Requests Blood Culture adequate volume  Final   Culture   Final    NO GROWTH < 12 HOURS Performed at [facility], [address] [place], [place] [address]    Report Status PENDING  Incomplete  MRSA Next Gen by PCR, Nasal     Status: None   Collection Time: 08/15/22  8:24 PM   Specimen: Nasal Mucosa; Nasal Swab  Result Value Ref Range Status   MRSA by PCR Next Gen NOT DETECTED NOT DETECTED Final    Comment: (NOTE) The GeneXpert MRSA Assay (FDA approved for NASAL specimens only), is one component of a comprehensive MRSA colonization surveillance program. It is not intended to diagnose MRSA infection nor to guide or monitor treatment for MRSA infections. Test performance is not FDA approved in patients less than 88 years old. Performed at [facility], [address], [place], [place] [address]      Radiology Studies: DG Chest [facility] 1 View  Result Date: 08/15/2022 CLINICAL DATA:  Questionable sepsis, oxygen desaturation EXAM: PORTABLE CHEST 1 VIEW COMPARISON:  Portable exam 1712 hours compared to 06/05/2022 FINDINGS: Normal heart size, mediastinal contours, and pulmonary vascularity. Persistent elevation of RIGHT diaphragm with RIGHT basilar atelectasis. Linear subsegmental atelectasis LEFT lower lobe. No acute infiltrate, pleural effusion, or pneumothorax. Osseous structures unremarkable. IMPRESSION: Chronic atelectasis RIGHT base. Electronically Signed   By: Lavonia Dana M.D.   On: 08/15/2022 17:18    Scheduled Meds:  amLODipine  10 mg Oral Daily   atorvastatin  10 mg Oral Daily   Chlorhexidine Gluconate Cloth  6 each Topical Daily   enoxaparin (LOVENOX) injection  40 mg Subcutaneous [id number]   folic acid  1 mg Oral Daily   lidocaine  1 patch Transdermal Daily   metoprolol tartrate  25 mg Oral BID   mometasone-formoterol  2 puff Inhalation BID   multivitamin  with minerals  1 tablet Oral Daily   nicotine  21 mg Transdermal Daily   pantoprazole  40 mg Oral Daily   predniSONE  40 mg Oral Q breakfast   thiamine  100 mg Oral Daily   Or   thiamine  100 mg Intravenous Daily   umeclidinium bromide  1 puff Inhalation Daily   Continuous Infusions:  sodium chloride 100 mL/hr at 08/16/22 0741   azithromycin Stopped (08/15/22 2010)   cefTRIAXone (ROCEPHIN)  IV Stopped (08/15/22 1836)     LOS: 1 day   Time spent: 65 mins  Charnese Federici Wynetta Emery, MD How to contact the [facility] Attending or Consulting provider [employer] or covering provider during after hours [place], for this patient?  Check the care team in [facility] and look for a) attending/consulting [facility] provider listed and b) the [facility] team listed  Log into [email or web address] and use [place]'s universal password to access. If you do not have the password, please contact the hospital operator. Locate the [facility] provider you are looking for under [facility] and page to a number that you can be directly reached. If you still have difficulty reaching the provider, please page the [facility] (Director on Call) for the Hospitalists listed on amion for assistance.  08/16/2022, 3:27 PM

## 2022-08-17 DIAGNOSIS — F1029 Alcohol dependence with unspecified alcohol-induced disorder: Secondary | ICD-10-CM | POA: Diagnosis not present

## 2022-08-17 DIAGNOSIS — E871 Hypo-osmolality and hyponatremia: Secondary | ICD-10-CM

## 2022-08-17 DIAGNOSIS — J9621 Acute and chronic respiratory failure with hypoxia: Secondary | ICD-10-CM | POA: Diagnosis not present

## 2022-08-17 DIAGNOSIS — E440 Moderate protein-calorie malnutrition: Secondary | ICD-10-CM | POA: Diagnosis not present

## 2022-08-17 LAB — BASIC METABOLIC PANEL
Anion gap: 6 (ref 5–15)
BUN: 9 mg/dL (ref 8–23)
CO2: 28 mmol/L (ref 22–32)
Calcium: 8.2 mg/dL — ABNORMAL LOW (ref 8.9–10.3)
Chloride: 96 mmol/L — ABNORMAL LOW (ref 98–111)
Creatinine, Ser: 0.75 mg/dL (ref 0.61–1.24)
GFR, Estimated: >60 mL/min (ref >60)
Glucose, Bld: 112 mg/dL — ABNORMAL HIGH (ref 70–99)
Potassium: 3.3 mmol/L — ABNORMAL LOW (ref 3.5–5.1)
Sodium: 130 mmol/L — ABNORMAL LOW (ref 135–145)

## 2022-08-17 LAB — MAGNESIUM: Magnesium: 1.6 mg/dL — ABNORMAL LOW (ref 1.7–2.4)

## 2022-08-17 MED ORDER — MAGNESIUM SULFATE 2 GM/50ML IV SOLN
2.0000 g | Freq: Once | INTRAVENOUS | Status: AC
  Administered 2022-08-17: 2 g via INTRAVENOUS
  Filled 2022-08-17: qty 50

## 2022-08-17 MED ORDER — IPRATROPIUM-ALBUTEROL 0.5-2.5 (3) MG/3ML IN SOLN: 3.0000 mL | RESPIRATORY_TRACT | 2 refills | Status: AC | PRN

## 2022-08-17 MED ORDER — DOXYCYCLINE HYCLATE 100 MG PO CAPS: 100.0000 mg | ORAL_CAPSULE | Freq: Two times a day (BID) | ORAL | 0 refills | Status: AC

## 2022-08-17 MED ORDER — METOPROLOL TARTRATE 25 MG PO TABS: 25.0000 mg | ORAL_TABLET | Freq: Two times a day (BID) | ORAL | 2 refills | Status: DC

## 2022-08-17 MED ORDER — PREDNISONE 20 MG PO TABS: 40.0000 mg | ORAL_TABLET | Freq: Every day | ORAL | 0 refills | Status: AC

## 2022-08-17 MED ORDER — FOLIC ACID 1 MG PO TABS: 1.0000 mg | ORAL_TABLET | Freq: Every day | ORAL | 2 refills | Status: AC

## 2022-08-17 MED ORDER — ADULT MULTIVITAMIN W/MINERALS CH: 1.0000 | ORAL_TABLET | Freq: Every day | ORAL | 2 refills | Status: DC

## 2022-08-17 MED ORDER — VITAMIN B-1 100 MG PO TABS: 100.0000 mg | ORAL_TABLET | Freq: Every day | ORAL | 2 refills | Status: DC

## 2022-08-17 NOTE — Discharge Summary (Signed)
Physician Discharge Summary  Jesus Walker [id number] DOB: 1961-03-26 DOA: 08/15/2022  PCP: Carrolyn Meiers, MD  Admit date: 08/15/2022 Discharge date: 08/17/2022  Admitted From:  HOME  Disposition: HOME   Recommendations for Outpatient Follow-up:  Follow up with PCP in 1 weeks  Please obtain BMP in one week Please continue to encourage alcohol cesssation and treatment  Tobacco cessation strongly advised  Discharge Condition: STABLE   CODE STATUS: FULL DIET: regular foods    Brief Hospitalization Summary: Please see all hospital notes, images, labs for full details of the hospitalization. ADMISSION HPI:  62 y.o. male with medical history significant of  asthma plus COPD and ongoing tobacco use, GERD, HTN, ongoing alcohol use who presents to the ED complaining of shortness of breath.  He is frequently admitted for few days and started on steroids until he improves and is able to go home.  He reports taking his meds as directed including his inhalers at home.  When he presented to the ED he was noted to have oxygen saturation in the low 80s and was given continuous nebs, Solu-Medrol, and oxygen therapy.  Patient is also noted to be markedly hyponatremic with a sodium of 116.  HOSPITAL COURSE BY PROBLEM    * Acute on chronic respiratory failure with hypoxia (HCC) Mild COPD exacerbation, treated with steroids, abx and bronchodilators He feels much better today;  He qualifies for home oxygen, we have ordered DC home today with outpatient follow up with PCP Strongly advised tobacco and alcohol cessation   COPD exacerbation ([place]) Treated with Symbicort Treated with Solu-Medrol plus prednisone DuoNebs He is clinically much improved, ambulating; no SOB complaints DC home on oral prednisone, bronchodilators, doxycycline   Hypomagnesemia IV replacement given and repleted   GERD (gastroesophageal reflux disease) Continue PPI for GI protection   HTN (hypertension) Continue  amlodipine Lopressor as needed  Serum total bilirubin elevated Secondary to ongoing alcohol abuse and subsequent liver damage  Malnutrition of moderate degree Encouraged healthy diet  Tobacco dependence Nicotine patch in hospital  Encouraged smoking cessation  Alcohol dependence ([place]) CIWA protocol in hospital  Thiamine and folate repletion Pt strongly advised to stop all alcohol consumption Resources provided for voluntary treatment   Hyponatremia Severe at 116, improved to 130 with IV fluid hydration  Secondary to ongoing heavy alcohol abuse This is previously been treated with IV normal saline and has improved with this treatment. He was strongly advised to stop all alcohol consumption   Discharge Diagnoses:  Principal Problem:   Acute on chronic respiratory failure with hypoxia (HCC) Active Problems:   COPD exacerbation (HCC)   Hyponatremia   Alcohol dependence (HCC)   Tobacco dependence   Malnutrition of moderate degree   Serum total bilirubin elevated   HTN (hypertension)   GERD (gastroesophageal reflux disease)   Hypomagnesemia   Discharge Instructions:  Allergies as of 08/17/2022   No Known Allergies      Medication List     TAKE these medications    acetaminophen 500 MG tablet Commonly known as: TYLENOL Take 500 mg by mouth every 6 (six) hours as needed for mild pain.   amLODipine 10 MG tablet Commonly known as: NORVASC Take 10 mg by mouth daily.   atorvastatin 10 MG tablet Commonly known as: LIPITOR Take 10 mg by mouth daily.   budesonide-formoterol 160-4.5 MCG/ACT inhaler Commonly known as: Symbicort Inhale 2 puffs into the lungs 2 (two) times daily.   doxycycline 100 MG capsule Commonly known as: VIBRAMYCIN  Take 1 capsule (100 mg total) by mouth 2 (two) times daily for 4 days.   folic acid 1 MG tablet Commonly known as: FOLVITE Take 1 tablet (1 mg total) by mouth daily.   ipratropium-albuterol 0.5-2.5 (3) MG/3ML Soln Commonly  known as: DUONEB Take 3 mLs by nebulization every 4 (four) hours as needed (SOB/wheezing).   metoprolol tartrate 25 MG tablet Commonly known as: LOPRESSOR Take 1 tablet (25 mg total) by mouth 2 (two) times daily.   multivitamin with minerals Tabs tablet Take 1 tablet by mouth daily. Start taking on: August 18, 2022   omeprazole 20 MG capsule Commonly known as: PRILOSEC Take 20 mg by mouth daily.   predniSONE 20 MG tablet Commonly known as: DELTASONE Take 2 tablets (40 mg total) by mouth daily with breakfast for 4 days. Start taking on: August 18, 2022   ProAir HFA 108 (90 Base) MCG/ACT inhaler Generic drug: albuterol Inhale 2 puffs into the lungs 4 (four) times daily. What changed: Another medication with the same name was removed. Continue taking this medication, and follow the directions you see here.   thiamine 100 MG tablet Commonly known as: Vitamin B-1 Take 1 tablet (100 mg total) by mouth daily.               Durable Medical Equipment  (From admission, onward)           Start     Ordered   08/17/22 1058  For home use only DME oxygen  Once       Question Answer Comment  Length of Need Lifetime   Mode or (Route) Nasal cannula   Liters per Minute 2   Frequency Continuous (stationary and portable oxygen unit needed)   Oxygen conserving device Yes   Oxygen delivery system Gas      08/17/22 1058            Follow-up Information     Fanta, Normajean Baxter, MD. Schedule an appointment as soon as possible for a visit in 1 week(s).   Specialty: Internal Medicine Why: Hospital Follow Up Contact information: [place] [address] [phone]                No Known Allergies Allergies as of 08/17/2022   No Known Allergies      Medication List     TAKE these medications    acetaminophen 500 MG tablet Commonly known as: TYLENOL Take 500 mg by mouth every 6 (six) hours as needed for mild pain.   amLODipine 10  MG tablet Commonly known as: NORVASC Take 10 mg by mouth daily.   atorvastatin 10 MG tablet Commonly known as: LIPITOR Take 10 mg by mouth daily.   budesonide-formoterol 160-4.5 MCG/ACT inhaler Commonly known as: Symbicort Inhale 2 puffs into the lungs 2 (two) times daily.   doxycycline 100 MG capsule Commonly known as: VIBRAMYCIN Take 1 capsule (100 mg total) by mouth 2 (two) times daily for 4 days.   folic acid 1 MG tablet Commonly known as: FOLVITE Take 1 tablet (1 mg total) by mouth daily.   ipratropium-albuterol 0.5-2.5 (3) MG/3ML Soln Commonly known as: DUONEB Take 3 mLs by nebulization every 4 (four) hours as needed (SOB/wheezing).   metoprolol tartrate 25 MG tablet Commonly known as: LOPRESSOR Take 1 tablet (25 mg total) by mouth 2 (two) times daily.   multivitamin with minerals Tabs tablet Take 1 tablet by mouth daily. Start taking on: August 18, 2022  omeprazole 20 MG capsule Commonly known as: PRILOSEC Take 20 mg by mouth daily.   predniSONE 20 MG tablet Commonly known as: DELTASONE Take 2 tablets (40 mg total) by mouth daily with breakfast for 4 days. Start taking on: August 18, 2022   ProAir HFA 108 (90 Base) MCG/ACT inhaler Generic drug: albuterol Inhale 2 puffs into the lungs 4 (four) times daily. What changed: Another medication with the same name was removed. Continue taking this medication, and follow the directions you see here.   thiamine 100 MG tablet Commonly known as: Vitamin B-1 Take 1 tablet (100 mg total) by mouth daily.               Durable Medical Equipment  (From admission, onward)           Start     Ordered   08/17/22 1058  For home use only DME oxygen  Once       Question Answer Comment  Length of Need Lifetime   Mode or (Route) Nasal cannula   Liters per Minute 2   Frequency Continuous (stationary and portable oxygen unit needed)   Oxygen conserving device Yes   Oxygen delivery system Gas      08/17/22 1058             Procedures/Studies: DG Chest Port 1 View  Result Date: 08/15/2022 CLINICAL DATA:  Questionable sepsis, oxygen desaturation EXAM: PORTABLE CHEST 1 VIEW COMPARISON:  Portable exam 1712 hours compared to 06/05/2022 FINDINGS: Normal heart size, mediastinal contours, and pulmonary vascularity. Persistent elevation of RIGHT diaphragm with RIGHT basilar atelectasis. Linear subsegmental atelectasis LEFT lower lobe. No acute infiltrate, pleural effusion, or pneumothorax. Osseous structures unremarkable. IMPRESSION: Chronic atelectasis RIGHT base. Electronically Signed   By: Lavonia Dana M.D.   On: 08/15/2022 17:18     Subjective: Pt reports he is feeling much better, wanting to go home, no SOB or CP, no palpitations.   Discharge Exam: Vitals:   08/17/22 1000 08/17/22 1105  BP: 127/61   Pulse: 73   Resp: 17   Temp:  97.8 F (36.6 C)  SpO2: 98%    Vitals:   08/17/22 0800 08/17/22 0900 08/17/22 1000 08/17/22 1105  BP: (!) 114/42 (!) 118/51 127/61   Pulse: 88 79 73   Resp: 16 17 17    Temp:    97.8 F (36.6 C)  TempSrc:    Oral  SpO2: 96% 98% 98%   Weight:      Height:       General: Pt is alert, awake, not in acute distress Cardiovascular: normal S1/S2 +, no rubs, no gallops Respiratory: no increased work of breathing, rare expiratory wheezing, no rhonchi Abdominal: Soft, NT, ND, bowel sounds + Extremities: no edema, no cyanosis   The results of significant diagnostics from this hospitalization (including imaging, microbiology, ancillary and laboratory) are listed below for reference.     Microbiology: Recent Results (from the past 240 hour(s))  Resp panel by RT-PCR (RSV, Flu A&B, Covid) Anterior Nasal Swab     Status: None   Collection Time: 08/15/22  5:02 PM   Specimen: Anterior Nasal Swab  Result Value Ref Range Status   SARS Coronavirus 2 by RT PCR NEGATIVE NEGATIVE Final    Comment: (NOTE) SARS-CoV-2 target nucleic acids are NOT DETECTED.  The SARS-CoV-2 RNA  is generally detectable in upper respiratory specimens during the acute phase of infection. The lowest concentration of SARS-CoV-2 viral copies this assay can detect is 138 copies/mL. A  negative result does not preclude SARS-Cov-2 infection and should not be used as the sole basis for treatment or other patient management decisions. A negative result may occur with  improper specimen collection/handling, submission of specimen other than nasopharyngeal swab, presence of viral mutation(s) within the areas targeted by this assay, and inadequate number of viral copies(<138 copies/mL). A negative result must be combined with clinical observations, patient history, and epidemiological information. The expected result is Negative.  Fact Sheet for Patients:  EntrepreneurPulse.com.au  Fact Sheet for Healthcare Providers:  IncredibleEmployment.be  This test is no t yet approved or cleared by the [place] FDA and  has been authorized for detection and/or diagnosis of SARS-CoV-2 by FDA under an Emergency Use Authorization (EUA). This EUA will remain  in effect (meaning this test can be used) for the duration of the COVID-19 declaration under Section 564(b)(1) of the Act, 21 U.S.C.section 360bbb-3(b)(1), unless the authorization is terminated  or revoked sooner.       Influenza A by PCR NEGATIVE NEGATIVE Final   Influenza B by PCR NEGATIVE NEGATIVE Final    Comment: (NOTE) The Xpert Xpress SARS-CoV-2/FLU/RSV plus assay is intended as an aid in the diagnosis of influenza from Nasopharyngeal swab specimens and should not be used as a sole basis for treatment. Nasal washings and aspirates are unacceptable for Xpert Xpress SARS-CoV-2/FLU/RSV testing.  Fact Sheet for Patients: EntrepreneurPulse.com.au  Fact Sheet for Healthcare Providers: IncredibleEmployment.be  This test is not yet approved or cleared by the  [place] FDA and has been authorized for detection and/or diagnosis of SARS-CoV-2 by FDA under an Emergency Use Authorization (EUA). This EUA will remain in effect (meaning this test can be used) for the duration of the COVID-19 declaration under Section 564(b)(1) of the Act, 21 U.S.C. section 360bbb-3(b)(1), unless the authorization is terminated or revoked.     Resp Syncytial Virus by PCR NEGATIVE NEGATIVE Final    Comment: (NOTE) Fact Sheet for Patients: EntrepreneurPulse.com.au  Fact Sheet for Healthcare Providers: IncredibleEmployment.be  This test is not yet approved or cleared by the [place] FDA and has been authorized for detection and/or diagnosis of SARS-CoV-2 by FDA under an Emergency Use Authorization (EUA). This EUA will remain in effect (meaning this test can be used) for the duration of the COVID-19 declaration under Section 564(b)(1) of the Act, 21 U.S.C. section 360bbb-3(b)(1), unless the authorization is terminated or revoked.  Performed at [facility], [address] [place], [place] [address]   Blood Culture (routine x 2)     Status: None (Preliminary result)   Collection Time: 08/15/22  5:46 PM   Specimen: Right Antecubital; Blood  Result Value Ref Range Status   Specimen Description   Final    RIGHT ANTECUBITAL BOTTLES DRAWN AEROBIC AND ANAEROBIC   Special Requests Blood Culture adequate volume  Final   Culture   Final    NO GROWTH 2 DAYS Performed at [facility], [address], [place],  [address]    Report Status PENDING  Incomplete  Blood Culture (routine x 2)     Status: None (Preliminary result)   Collection Time: 08/15/22  5:46 PM   Specimen: BLOOD RIGHT HAND  Result Value Ref Range Status   Specimen Description   Final    BLOOD RIGHT HAND BOTTLES DRAWN AEROBIC AND ANAEROBIC   Special Requests Blood Culture adequate volume  Final   Culture   Final    NO GROWTH 2 DAYS Performed at [facility], [address], [place], [place] [address]    Report Status PENDING  Incomplete  MRSA Next Gen by PCR, Nasal     Status: None   Collection Time: 08/15/22  8:24 PM   Specimen: Nasal Mucosa; Nasal Swab  Result Value Ref Range Status   MRSA by PCR Next Gen NOT DETECTED NOT DETECTED Final    Comment: (NOTE) The GeneXpert MRSA Assay (FDA approved for NASAL specimens only), is one component of a comprehensive MRSA colonization surveillance program. It is not intended to diagnose MRSA infection nor to guide or monitor treatment for MRSA infections. Test performance is not FDA approved in patients less than 18 years old. Performed at [facility], [address], [place],  [address]     Labs: BNP (last 3 results) No results for input(s): "BNP" in the last 8760 hours. Basic Metabolic Panel: Recent Labs  Lab 08/15/22 1746 08/16/22 0302 08/17/22 0420  NA 116* 128* 130*  K 3.6 3.9 3.3*  CL 81* 95* 96*  CO2 23 23 28   GLUCOSE 78 117* 112*  BUN 6* 5* 9  CREATININE 0.74 0.74 0.75  CALCIUM 8.2* 7.9* 8.2*  MG  --  1.9 1.6*   Liver Function Tests: Recent Labs  Lab 08/15/22 1746 08/16/22 0302  AST 56* 43*  ALT 30 25  ALKPHOS 110 88  BILITOT 1.6* 1.3*  PROT 7.7 6.2*  ALBUMIN 3.9 3.1*   No results for input(s): "LIPASE", "AMYLASE" in the last 168 hours. No results for input(s): "AMMONIA" in the last 168 hours. CBC: Recent Labs  Lab 08/15/22 1746  WBC 6.5  NEUTROABS 3.7  HGB 16.2  HCT 45.3  MCV 102.3*  PLT 175   Cardiac Enzymes: No results for input(s): "CKTOTAL", "CKMB", "CKMBINDEX", "TROPONINI" in the last 168 hours. BNP: Invalid input(s): "POCBNP" CBG: No results for input(s): "GLUCAP" in the last 168 hours. D-Dimer No results for input(s): "DDIMER" in the last 72 hours. Hgb A1c No results for input(s): "HGBA1C" in the last 72 hours. Lipid Profile No results for input(s): "CHOL", "HDL", "LDLCALC", "TRIG", "CHOLHDL", "LDLDIRECT" in the last 72  hours. Thyroid function studies No results for input(s): "TSH", "T4TOTAL", "T3FREE", "THYROIDAB" in the last 72 hours.  Invalid input(s): "FREET3" Anemia work up No results for input(s): "VITAMINB12", "FOLATE", "FERRITIN", "TIBC", "IRON", "RETICCTPCT" in the last 72 hours. Urinalysis    Component Value Date/Time   COLORURINE STRAW (A) 08/15/2022 1847   APPEARANCEUR CLEAR 08/15/2022 1847   LABSPEC 1.003 (L) 08/15/2022 1847   PHURINE 6.0 08/15/2022 1847   GLUCOSEU NEGATIVE 08/15/2022 1847   HGBUR SMALL (A) 08/15/2022 1847   BILIRUBINUR NEGATIVE 08/15/2022 1847   KETONESUR NEGATIVE 08/15/2022 1847   PROTEINUR NEGATIVE 08/15/2022 1847   NITRITE NEGATIVE 08/15/2022 1847   LEUKOCYTESUR NEGATIVE 08/15/2022 1847   Sepsis Labs Recent Labs  Lab 08/15/22 1746  WBC 6.5   Microbiology Recent Results (from the past 240 hour(s))  Resp panel by RT-PCR (RSV, Flu A&B, Covid) Anterior Nasal Swab     Status: None   Collection Time: 08/15/22  5:02 PM   Specimen: Anterior Nasal Swab  Result Value Ref Range Status   SARS Coronavirus 2 by RT PCR NEGATIVE NEGATIVE Final    Comment: (NOTE) SARS-CoV-2 target nucleic acids are NOT DETECTED.  The SARS-CoV-2 RNA is generally detectable in upper respiratory specimens during the acute phase of infection. The lowest concentration of SARS-CoV-2 viral copies this assay can detect is 138 copies/mL. A negative result does not preclude SARS-Cov-2 infection and  should not be used as the sole basis for treatment or other patient management decisions. A negative result may occur with  improper specimen collection/handling, submission of specimen other than nasopharyngeal swab, presence of viral mutation(s) within the areas targeted by this assay, and inadequate number of viral copies(<138 copies/mL). A negative result must be combined with clinical observations, patient history, and epidemiological information. The expected result is Negative.  Fact Sheet  for Patients:  EntrepreneurPulse.com.au  Fact Sheet for Healthcare Providers:  IncredibleEmployment.be  This test is no t yet approved or cleared by the [place] FDA and  has been authorized for detection and/or diagnosis of SARS-CoV-2 by FDA under an Emergency Use Authorization (EUA). This EUA will remain  in effect (meaning this test can be used) for the duration of the COVID-19 declaration under Section 564(b)(1) of the Act, 21 U.S.C.section 360bbb-3(b)(1), unless the authorization is terminated  or revoked sooner.       Influenza A by PCR NEGATIVE NEGATIVE Final   Influenza B by PCR NEGATIVE NEGATIVE Final    Comment: (NOTE) The Xpert Xpress SARS-CoV-2/FLU/RSV plus assay is intended as an aid in the diagnosis of influenza from Nasopharyngeal swab specimens and should not be used as a sole basis for treatment. Nasal washings and aspirates are unacceptable for Xpert Xpress SARS-CoV-2/FLU/RSV testing.  Fact Sheet for Patients: EntrepreneurPulse.com.au  Fact Sheet for Healthcare Providers: IncredibleEmployment.be  This test is not yet approved or cleared by the [place] FDA and has been authorized for detection and/or diagnosis of SARS-CoV-2 by FDA under an Emergency Use Authorization (EUA). This EUA will remain in effect (meaning this test can be used) for the duration of the COVID-19 declaration under Section 564(b)(1) of the Act, 21 U.S.C. section 360bbb-3(b)(1), unless the authorization is terminated or revoked.     Resp Syncytial Virus by PCR NEGATIVE NEGATIVE Final    Comment: (NOTE) Fact Sheet for Patients: EntrepreneurPulse.com.au  Fact Sheet for Healthcare Providers: IncredibleEmployment.be  This test is not yet approved or cleared by the [place] FDA and has been authorized for detection and/or diagnosis of SARS-CoV-2 by FDA under an  Emergency Use Authorization (EUA). This EUA will remain in effect (meaning this test can be used) for the duration of the COVID-19 declaration under Section 564(b)(1) of the Act, 21 U.S.C. section 360bbb-3(b)(1), unless the authorization is terminated or revoked.  Performed at [facility], [address] [place], [place] [address]   Blood Culture (routine x 2)     Status: None (Preliminary result)   Collection Time: 08/15/22  5:46 PM   Specimen: Right Antecubital; Blood  Result Value Ref Range Status   Specimen Description   Final    RIGHT ANTECUBITAL BOTTLES DRAWN AEROBIC AND ANAEROBIC   Special Requests Blood Culture adequate volume  Final   Culture   Final    NO GROWTH 2 DAYS Performed at [facility], [address], [place], [place] [address]    Report Status PENDING  Incomplete  Blood Culture (routine x 2)     Status: None (Preliminary result)   Collection Time: 08/15/22  5:46 PM   Specimen: BLOOD RIGHT HAND  Result Value Ref Range Status   Specimen Description   Final    BLOOD RIGHT HAND BOTTLES DRAWN AEROBIC AND ANAEROBIC   Special Requests Blood Culture adequate volume  Final   Culture   Final    NO GROWTH 2 DAYS Performed at [facility], [address], [place], [place] [address]  Report Status PENDING  Incomplete  MRSA Next Gen by PCR, Nasal     Status: None   Collection Time: 08/15/22  8:24 PM   Specimen: Nasal Mucosa; Nasal Swab  Result Value Ref Range Status   MRSA by PCR Next Gen NOT DETECTED NOT DETECTED Final    Comment: (NOTE) The GeneXpert MRSA Assay (FDA approved for NASAL specimens only), is one component of a comprehensive MRSA colonization surveillance program. It is not intended to diagnose MRSA infection nor to guide or monitor treatment for MRSA infections. Test performance is not FDA approved in patients less than 18 years old. Performed at [facility], [address], [place], [place] [address]    Time coordinating discharge: 42 mins    SIGNED:  Irwin Brakeman, MD  [facility] 08/17/2022, 11:07 AM How to contact the [facility] Attending or Consulting provider [place] or covering provider during after hours [place], for this patient?  Check the care team in [facility] and look for a) attending/consulting [facility] provider listed and b) the [facility] team listed Log into [email or web address] and use [place]'s universal password to access. If you do not have the password, please contact the hospital operator. Locate the [facility] provider you are looking for under [facility] and page to a number that you can be directly reached. If you still have difficulty reaching the provider, please page the [facility] (Director on Call) for the Hospitalists listed on amion for assistance.

## 2022-08-17 NOTE — Discharge Instructions (Signed)
IMPORTANT INFORMATION: PAY CLOSE ATTENTION  ? ?PHYSICIAN DISCHARGE INSTRUCTIONS ? ?Follow with Primary care provider  Fanta, Tesfaye Demissie, MD  and other consultants as instructed by your Hospitalist Physician ? ?SEEK MEDICAL CARE OR RETURN TO EMERGENCY ROOM IF SYMPTOMS COME BACK, WORSEN OR NEW PROBLEM DEVELOPS  ? ?Please note: ?You were cared for by a hospitalist during your hospital stay. Every effort will be made to forward records to your primary care provider.  You can request that your primary care provider send for your hospital records if they have not received them.  Once you are discharged, your primary care physician will handle any further medical issues. Please note that NO REFILLS for any discharge medications will be authorized once you are discharged, as it is imperative that you return to your primary care physician (or establish a relationship with a primary care physician if you do not have one) for your post hospital discharge needs so that they can reassess your need for medications and monitor your lab values. ? ?Please get a complete blood count and chemistry panel checked by your Primary MD at your next visit, and again as instructed by your Primary MD. ? ?Get Medicines reviewed and adjusted: ?Please take all your medications with you for your next visit with your Primary MD ? ?Laboratory/radiological data: ?Please request your Primary MD to go over all hospital tests and procedure/radiological results at the follow up, please ask your primary care provider to get all Hospital records sent to his/her office. ? ?In some cases, they will be blood work, cultures and biopsy results pending at the time of your discharge. Please request that your primary care provider follow up on these results. ? ?If you are diabetic, please bring your blood sugar readings with you to your follow up appointment with primary care.   ? ?Please call and make your follow up appointments as soon as possible.    ? ?Also Note the following: ?If you experience worsening of your admission symptoms, develop shortness of breath, life threatening emergency, suicidal or homicidal thoughts you must seek medical attention immediately by calling 911 or calling your MD immediately  if symptoms less severe. ? ?You must read complete instructions/literature along with all the possible adverse reactions/side effects for all the Medicines you take and that have been prescribed to you. Take any new Medicines after you have completely understood and accpet all the possible adverse reactions/side effects.  ? ?Do not drive when taking Pain medications or sleeping medications (Benzodiazepines) ? ?Do not take more than prescribed Pain, Sleep and Anxiety Medications. It is not advisable to combine anxiety,sleep and pain medications without talking with your primary care practitioner ? ?Special Instructions: If you have smoked or chewed Tobacco  in the last 2 yrs please stop smoking, stop any regular Alcohol  and or any Recreational drug use. ? ?Wear Seat belts while driving.  Do not drive if taking any narcotic, mind altering or controlled substances or recreational drugs or alcohol.  ? ? ? ? ? ?

## 2022-08-17 NOTE — Progress Notes (Signed)
SATURATION QUALIFICATIONS: (This note is used to comply with regulatory documentation for home oxygen)  Patient Saturations on Room Air at Rest = 95%  Patient Saturations on Room Air while Ambulating = 84%  Patient Saturations on 2 Liters of oxygen while Ambulating = 90%  Please briefly explain why patient needs home oxygen:

## 2022-08-17 NOTE — Progress Notes (Signed)
Being discharged home. IV access removed and patient was allowed to dress himself. Had to wait for oxygen to be delivered which it was. Rolled to car by wheel chair and wife transported patient home.

## 2022-08-17 NOTE — Evaluation (Signed)
Physical Therapy Evaluation Patient Details Name: Jesus Walker MRN: [id number] DOB: 10-23-1960 Today's Date: 08/17/2022  History of Present Illness  Jesus Walker is a 62 y.o. male with medical history significant of  asthma plus COPD and ongoing tobacco use, GERD, HTN, ongoing alcohol use who presents to the ED complaining of shortness of breath.  He is frequently admitted for few days and started on steroids until he improves and is able to go home.  He reports taking his meds as directed including his inhalers at home.  When he presented to the ED he was noted to have oxygen saturation in the low 80s and was given continuous nebs, Solu-Medrol, and oxygen therapy.  Patient is also noted to be markedly hyponatremic with a sodium of 116.   Clinical Impression  Patient O2 sat monitored throughout session as follows: Patient with quick decline in O2 sat from mid 90% to  low 80s with further decline to 67% despite increasing O2 to 6L without SOB or symptoms. After short rest period, patient ambulates again on 2L with O2 staying >92%. Patient does not requires assist and is independent with all mobility. Patient discharged to care of nursing for ambulation daily as tolerated for length of stay.      Recommendations for follow up therapy are one component of a multi-disciplinary discharge planning process, led by the attending physician.  Recommendations may be updated based on patient status, additional functional criteria and insurance authorization.  Follow Up Recommendations No PT follow up      Assistance Recommended at Discharge PRN  Patient can return home with the following  Assistance with cooking/housework    Equipment Recommendations None recommended by PT  Recommendations for Other Services       Functional Status Assessment Patient has not had a recent decline in their functional status     Precautions / Restrictions Precautions Precautions: Fall Restrictions Weight Bearing  Restrictions: No      Mobility  Bed Mobility Overal bed mobility: Independent                  Transfers Overall transfer level: Independent Equipment used: None Transfers: Sit to/from Stand Sit to Stand: Independent                Ambulation/Gait Ambulation/Gait assistance: Independent Gait Distance (Feet): 200 Feet Assistive device: None Gait Pattern/deviations: Step-through pattern, Decreased stride length          Stairs            Wheelchair Mobility    Modified Rankin (Stroke Patients Only)       Balance Overall balance assessment: Mild deficits observed, not formally tested                                           Pertinent Vitals/Pain Pain Assessment Pain Assessment: No/denies pain    Home Living Family/patient expects to be discharged to:: Private residence Living Arrangements: Spouse/significant other Available Help at Discharge: Family Type of Home: Mobile home Home Access: Ramped entrance       Home Layout: One level Home Equipment: Conservation officer, nature (2 wheels);Cane - single point;Shower seat      Prior Function Prior Level of Function : Independent/Modified Independent             Mobility Comments: household and short distance community ambulation with AD PRN ADLs Comments: independent  Hand Dominance        Extremity/Trunk Assessment   Upper Extremity Assessment Upper Extremity Assessment: Overall WFL for tasks assessed    Lower Extremity Assessment Lower Extremity Assessment: Overall WFL for tasks assessed    Cervical / Trunk Assessment Cervical / Trunk Assessment: Normal  Communication   Communication: No difficulties  Cognition Arousal/Alertness: Awake/alert Behavior During Therapy: WFL for tasks assessed/performed Overall Cognitive Status: Within Functional Limits for tasks assessed                                          General Comments       Exercises     Assessment/Plan    PT Assessment Patient does not need any further PT services  PT Problem List         PT Treatment Interventions      PT Goals (Current goals can be found in the Care Plan section)  Acute Rehab PT Goals Patient Stated Goal: Return home PT Goal Formulation: With patient Time For Goal Achievement: 08/17/22 Potential to Achieve Goals: Good    Frequency       Co-evaluation               AM-PAC PT "6 Clicks" Mobility  Outcome Measure Help needed turning from your back to your side while in a flat bed without using bedrails?: None Help needed moving from lying on your back to sitting on the side of a flat bed without using bedrails?: None Help needed moving to and from a bed to a chair (including a wheelchair)?: None Help needed standing up from a chair using your arms (e.g., wheelchair or bedside chair)?: None Help needed to walk in hospital room?: None Help needed climbing 3-5 steps with a railing? : A Little 6 Click Score: 23    End of Session   Activity Tolerance: Patient tolerated treatment well Patient left: in bed;with call bell/phone within reach Nurse Communication: Mobility status PT Visit Diagnosis: Unsteadiness on feet (R26.81);Other abnormalities of gait and mobility (R26.89)    Time: [id number] PT Time Calculation (min) (ACUTE ONLY): 14 min   Charges:   PT Evaluation $PT Eval Low Complexity: 1 Low PT Treatments $Therapeutic Activity: 8-22 mins       12:17 PM, 08/17/22 Mearl Latin PT, DPT Physical Therapist at [facility]

## 2022-08-17 NOTE — TOC Transition Note (Signed)
Transition of Care [facility]) - CM/SW Discharge Note   Patient Details  Name: Jesus Walker MRN: [id number] Date of Birth: [birth year]  [facility] CM/SW Contact:  Illene Regulus, LCSW Phone Number: 08/17/2022, 12:31 PM   Clinical Narrative:     CSW attempted to contact pt via phone , left VM requesting return call. Pt was recommended for home O2 , referral sent out to [employer] [facility], O2 will be delivered to pt's room prior to d/c.         Patient Goals and CMS Choice      Discharge Placement                         Discharge Plan and Services Additional resources added to the After Visit Summary for                                       Social Determinants of Health (SDOH) Interventions SDOH Screenings   Food Insecurity: No Food Insecurity (08/15/2022)  Housing: Low Risk  (08/15/2022)  Transportation Needs: Unmet Transportation Needs (08/15/2022)  Utilities: Not At Risk (08/15/2022)  Tobacco Use: High Risk (08/15/2022)     Readmission Risk Interventions     No data to display

## 2022-08-17 NOTE — Assessment & Plan Note (Signed)
IV replacement given and repleted

## 2022-08-20 LAB — CULTURE, BLOOD (ROUTINE X 2)
Culture: NO GROWTH
Culture: NO GROWTH
Special Requests: ADEQUATE
Special Requests: ADEQUATE

## 2022-09-30 ENCOUNTER — Other Ambulatory Visit (HOSPITAL_COMMUNITY): Payer: Self-pay | Admitting: Gerontology

## 2022-09-30 DIAGNOSIS — F17210 Nicotine dependence, cigarettes, uncomplicated: Secondary | ICD-10-CM

## 2022-10-22 ENCOUNTER — Ambulatory Visit (HOSPITAL_COMMUNITY)
Admission: RE | Admit: 2022-10-22 | Discharge: 2022-10-22 | Disposition: A | Discharge status: Home / Self Care | Payer: Medicaid Other | Source: Ambulatory Visit | Attending: Gerontology | Admitting: Gerontology

## 2022-10-22 DIAGNOSIS — F17210 Nicotine dependence, cigarettes, uncomplicated: Secondary | ICD-10-CM | POA: Diagnosis present

## 2023-01-15 ENCOUNTER — Observation Stay (HOSPITAL_COMMUNITY)
Admission: EM | Admit: 2023-01-15 | Discharge: 2023-01-16 | Disposition: A | Discharge status: Home / Self Care | Payer: Medicaid Other | Attending: Emergency Medicine | Admitting: Emergency Medicine

## 2023-01-15 ENCOUNTER — Encounter (HOSPITAL_COMMUNITY): Payer: Self-pay

## 2023-01-15 ENCOUNTER — Other Ambulatory Visit: Payer: Self-pay

## 2023-01-15 ENCOUNTER — Emergency Department (HOSPITAL_COMMUNITY): Payer: Medicaid Other

## 2023-01-15 DIAGNOSIS — E878 Other disorders of electrolyte and fluid balance, not elsewhere classified: Secondary | ICD-10-CM | POA: Diagnosis present

## 2023-01-15 DIAGNOSIS — K219 Gastro-esophageal reflux disease without esophagitis: Secondary | ICD-10-CM | POA: Diagnosis present

## 2023-01-15 DIAGNOSIS — J9621 Acute and chronic respiratory failure with hypoxia: Secondary | ICD-10-CM | POA: Diagnosis not present

## 2023-01-15 DIAGNOSIS — R0602 Shortness of breath: Secondary | ICD-10-CM | POA: Diagnosis present

## 2023-01-15 DIAGNOSIS — E871 Hypo-osmolality and hyponatremia: Principal | ICD-10-CM | POA: Diagnosis present

## 2023-01-15 DIAGNOSIS — J45909 Unspecified asthma, uncomplicated: Secondary | ICD-10-CM | POA: Diagnosis not present

## 2023-01-15 DIAGNOSIS — I10 Essential (primary) hypertension: Secondary | ICD-10-CM | POA: Diagnosis present

## 2023-01-15 DIAGNOSIS — F17200 Nicotine dependence, unspecified, uncomplicated: Secondary | ICD-10-CM | POA: Diagnosis present

## 2023-01-15 DIAGNOSIS — F1020 Alcohol dependence, uncomplicated: Secondary | ICD-10-CM | POA: Diagnosis present

## 2023-01-15 DIAGNOSIS — F17210 Nicotine dependence, cigarettes, uncomplicated: Secondary | ICD-10-CM | POA: Insufficient documentation

## 2023-01-15 DIAGNOSIS — Z79899 Other long term (current) drug therapy: Secondary | ICD-10-CM | POA: Insufficient documentation

## 2023-01-15 DIAGNOSIS — J441 Chronic obstructive pulmonary disease with (acute) exacerbation: Secondary | ICD-10-CM | POA: Diagnosis present

## 2023-01-15 DIAGNOSIS — Z1152 Encounter for screening for COVID-19: Secondary | ICD-10-CM | POA: Insufficient documentation

## 2023-01-15 LAB — URINALYSIS, ROUTINE W REFLEX MICROSCOPIC
Bilirubin Urine: NEGATIVE
Glucose, UA: NEGATIVE mg/dL
Hgb urine dipstick: NEGATIVE
Ketones, ur: NEGATIVE mg/dL
Leukocytes,Ua: NEGATIVE
Nitrite: NEGATIVE
Protein, ur: NEGATIVE mg/dL
Specific Gravity, Urine: 1.005 (ref 1.005–1.030)
pH: 7 (ref 5.0–8.0)

## 2023-01-15 LAB — BASIC METABOLIC PANEL
Anion gap: 11 (ref 5–15)
Anion gap: 13 (ref 5–15)
BUN: 5 mg/dL — ABNORMAL LOW (ref 8–23)
BUN: 5 mg/dL — ABNORMAL LOW (ref 8–23)
CO2: 24 mmol/L (ref 22–32)
CO2: 23 mmol/L (ref 22–32)
Calcium: 8.3 mg/dL — ABNORMAL LOW (ref 8.9–10.3)
Calcium: 8.5 mg/dL — ABNORMAL LOW (ref 8.9–10.3)
Chloride: 85 mmol/L — ABNORMAL LOW (ref 98–111)
Chloride: 90 mmol/L — ABNORMAL LOW (ref 98–111)
Creatinine, Ser: 0.74 mg/dL (ref 0.61–1.24)
Creatinine, Ser: 0.79 mg/dL (ref 0.61–1.24)
GFR, Estimated: >60 mL/min (ref >60)
GFR, Estimated: >60 mL/min (ref >60)
Glucose, Bld: 90 mg/dL (ref 70–99)
Glucose, Bld: 119 mg/dL — ABNORMAL HIGH (ref 70–99)
Potassium: 3.8 mmol/L (ref 3.5–5.1)
Potassium: 3.8 mmol/L (ref 3.5–5.1)
Sodium: 120 mmol/L — ABNORMAL LOW (ref 135–145)
Sodium: 126 mmol/L — ABNORMAL LOW (ref 135–145)

## 2023-01-15 LAB — RESP PANEL BY RT-PCR (RSV, FLU A&B, COVID)  RVPGX2
Influenza A by PCR: NEGATIVE
Influenza B by PCR: NEGATIVE
Resp Syncytial Virus by PCR: NEGATIVE
SARS Coronavirus 2 by RT PCR: NEGATIVE

## 2023-01-15 LAB — CBC WITH DIFFERENTIAL/PLATELET
Abs Immature Granulocytes: 0 10*3/uL (ref 0.00–0.07)
Basophils Absolute: 0 10*3/uL (ref 0.0–0.1)
Basophils Relative: 1 %
Eosinophils Absolute: 0.5 10*3/uL (ref 0.0–0.5)
Eosinophils Relative: 12 %
HCT: 40.9 % (ref 39.0–52.0)
Hemoglobin: 14.6 g/dL (ref 13.0–17.0)
Immature Granulocytes: 0 %
Lymphocytes Relative: 33 %
Lymphs Abs: 1.3 10*3/uL (ref 0.7–4.0)
MCH: 35.3 pg — ABNORMAL HIGH (ref 26.0–34.0)
MCHC: 35.7 g/dL (ref 30.0–36.0)
MCV: 98.8 fL (ref 80.0–100.0)
Monocytes Absolute: 0.5 10*3/uL (ref 0.1–1.0)
Monocytes Relative: 13 %
Neutro Abs: 1.7 10*3/uL (ref 1.7–7.7)
Neutrophils Relative %: 41 %
Platelets: 166 10*3/uL (ref 150–400)
RBC: 4.14 MIL/uL — ABNORMAL LOW (ref 4.22–5.81)
RDW: 13.2 % (ref 11.5–15.5)
WBC: 4.1 10*3/uL (ref 4.0–10.5)
nRBC: 0 % (ref 0.0–0.2)

## 2023-01-15 LAB — BLOOD GAS, VENOUS
Acid-Base Excess: 1 mmol/L (ref 0.0–2.0)
Bicarbonate: 27.6 mmol/L (ref 20.0–28.0)
Drawn by: 1528
O2 Saturation: 72.5 %
Patient temperature: 36.4
pCO2, Ven: 49 mmHg (ref 44–60)
pH, Ven: 7.36 (ref 7.25–7.43)
pO2, Ven: 41 mmHg (ref 32–45)

## 2023-01-15 LAB — PROCALCITONIN: Procalcitonin: <0.1 ng/mL

## 2023-01-15 LAB — TROPONIN I (HIGH SENSITIVITY)
Troponin I (High Sensitivity): 6 ng/L (ref <18)
Troponin I (High Sensitivity): 5 ng/L (ref <18)

## 2023-01-15 MED ORDER — ONDANSETRON HCL 4 MG/2ML IJ SOLN: 4.0000 mg | Freq: Four times a day (QID) | INTRAMUSCULAR | Status: DC | PRN

## 2023-01-15 MED ORDER — PANTOPRAZOLE SODIUM 40 MG IV SOLR
40.0000 mg | INTRAVENOUS | Status: DC
  Administered 2023-01-15: 40 mg via INTRAVENOUS
  Filled 2023-01-15: qty 10

## 2023-01-15 MED ORDER — ADULT MULTIVITAMIN W/MINERALS CH
1.0000 | ORAL_TABLET | Freq: Every day | ORAL | Status: DC
  Administered 2023-01-16: 1 via ORAL
  Filled 2023-01-15: qty 1

## 2023-01-15 MED ORDER — ALBUTEROL SULFATE (2.5 MG/3ML) 0.083% IN NEBU: 2.5000 mg | INHALATION_SOLUTION | RESPIRATORY_TRACT | Status: DC | PRN

## 2023-01-15 MED ORDER — MOMETASONE FURO-FORMOTEROL FUM 200-5 MCG/ACT IN AERO
2.0000 | INHALATION_SPRAY | Freq: Two times a day (BID) | RESPIRATORY_TRACT | Status: DC
  Administered 2023-01-16: 2 via RESPIRATORY_TRACT
  Filled 2023-01-15: qty 8.8

## 2023-01-15 MED ORDER — IPRATROPIUM-ALBUTEROL 0.5-2.5 (3) MG/3ML IN SOLN
3.0000 mL | Freq: Three times a day (TID) | RESPIRATORY_TRACT | Status: DC
  Administered 2023-01-16: 3 mL via RESPIRATORY_TRACT
  Filled 2023-01-15 (×2): qty 3

## 2023-01-15 MED ORDER — METHYLPREDNISOLONE SODIUM SUCC 125 MG IJ SOLR
125.0000 mg | INTRAMUSCULAR | Status: AC
  Administered 2023-01-16: 125 mg via INTRAVENOUS
  Filled 2023-01-15: qty 2

## 2023-01-15 MED ORDER — IPRATROPIUM BROMIDE 0.02 % IN SOLN
0.5000 mg | Freq: Once | RESPIRATORY_TRACT | Status: AC
  Administered 2023-01-15: 0.5 mg via RESPIRATORY_TRACT
  Filled 2023-01-15: qty 2.5

## 2023-01-15 MED ORDER — THIAMINE HCL 100 MG/ML IJ SOLN: 100.0000 mg | Freq: Every day | INTRAMUSCULAR | Status: DC

## 2023-01-15 MED ORDER — SODIUM CHLORIDE 0.9 % IV BOLUS
1000.0000 mL | Freq: Once | INTRAVENOUS | Status: AC
  Administered 2023-01-15: 1000 mL via INTRAVENOUS

## 2023-01-15 MED ORDER — ONDANSETRON HCL 4 MG PO TABS: 4.0000 mg | ORAL_TABLET | Freq: Four times a day (QID) | ORAL | Status: DC | PRN

## 2023-01-15 MED ORDER — METHYLPREDNISOLONE SODIUM SUCC 125 MG IJ SOLR
125.0000 mg | Freq: Once | INTRAMUSCULAR | Status: AC
  Administered 2023-01-15: 125 mg via INTRAVENOUS
  Filled 2023-01-15: qty 2

## 2023-01-15 MED ORDER — ALBUTEROL (5 MG/ML) CONTINUOUS INHALATION SOLN
10.0000 mg/h | INHALATION_SOLUTION | Freq: Once | RESPIRATORY_TRACT | Status: DC
  Filled 2023-01-15: qty 20

## 2023-01-15 MED ORDER — LORAZEPAM 2 MG/ML IJ SOLN: 1.0000 mg | INTRAMUSCULAR | Status: DC | PRN

## 2023-01-15 MED ORDER — THIAMINE MONONITRATE 100 MG PO TABS
100.0000 mg | ORAL_TABLET | Freq: Every day | ORAL | Status: DC
  Administered 2023-01-16: 100 mg via ORAL
  Filled 2023-01-15: qty 1

## 2023-01-15 MED ORDER — ATORVASTATIN CALCIUM 10 MG PO TABS
10.0000 mg | ORAL_TABLET | Freq: Every day | ORAL | Status: DC
  Administered 2023-01-15: 10 mg via ORAL
  Filled 2023-01-15: qty 1

## 2023-01-15 MED ORDER — ACETAMINOPHEN 650 MG RE SUPP: 650.0000 mg | Freq: Four times a day (QID) | RECTAL | Status: DC | PRN

## 2023-01-15 MED ORDER — SODIUM CHLORIDE 0.9 % IV SOLN
1.0000 g | Freq: Once | INTRAVENOUS | Status: AC
  Administered 2023-01-15: 1 g via INTRAVENOUS
  Filled 2023-01-15: qty 10

## 2023-01-15 MED ORDER — ALBUTEROL SULFATE (2.5 MG/3ML) 0.083% IN NEBU
INHALATION_SOLUTION | RESPIRATORY_TRACT | Status: AC
  Administered 2023-01-15: 10 mg
  Filled 2023-01-15: qty 12

## 2023-01-15 MED ORDER — PREDNISONE 20 MG PO TABS: 40.0000 mg | ORAL_TABLET | Freq: Every day | ORAL | Status: DC

## 2023-01-15 MED ORDER — NICOTINE 21 MG/24HR TD PT24
21.0000 mg | MEDICATED_PATCH | Freq: Every day | TRANSDERMAL | Status: DC
  Administered 2023-01-15 – 2023-01-16 (×2): 21 mg via TRANSDERMAL
  Filled 2023-01-15 (×2): qty 1

## 2023-01-15 MED ORDER — OXYCODONE HCL 5 MG PO TABS: 5.0000 mg | ORAL_TABLET | ORAL | Status: DC | PRN

## 2023-01-15 MED ORDER — ACETAMINOPHEN 325 MG PO TABS: 650.0000 mg | ORAL_TABLET | Freq: Four times a day (QID) | ORAL | Status: DC | PRN

## 2023-01-15 MED ORDER — AMLODIPINE BESYLATE 5 MG PO TABS
10.0000 mg | ORAL_TABLET | Freq: Every day | ORAL | Status: DC
  Administered 2023-01-16: 10 mg via ORAL
  Filled 2023-01-15: qty 2

## 2023-01-15 MED ORDER — IPRATROPIUM-ALBUTEROL 0.5-2.5 (3) MG/3ML IN SOLN
3.0000 mL | Freq: Four times a day (QID) | RESPIRATORY_TRACT | Status: DC
  Administered 2023-01-15: 3 mL via RESPIRATORY_TRACT
  Filled 2023-01-15: qty 3

## 2023-01-15 MED ORDER — HEPARIN SODIUM (PORCINE) 5000 UNIT/ML IJ SOLN
5000.0000 [IU] | Freq: Three times a day (TID) | INTRAMUSCULAR | Status: DC
  Administered 2023-01-15 – 2023-01-16 (×2): 5000 [IU] via SUBCUTANEOUS
  Filled 2023-01-15: qty 1

## 2023-01-15 MED ORDER — FOLIC ACID 1 MG PO TABS
1.0000 mg | ORAL_TABLET | Freq: Every day | ORAL | Status: DC
  Administered 2023-01-16: 1 mg via ORAL
  Filled 2023-01-15: qty 1

## 2023-01-15 MED ORDER — LORAZEPAM 1 MG PO TABS: 1.0000 mg | ORAL_TABLET | ORAL | Status: DC | PRN

## 2023-01-15 MED ORDER — AZITHROMYCIN 250 MG PO TABS
500.0000 mg | ORAL_TABLET | Freq: Once | ORAL | Status: AC
  Administered 2023-01-15: 500 mg via ORAL
  Filled 2023-01-15: qty 2

## 2023-01-15 NOTE — Assessment & Plan Note (Signed)
Continue protonix  

## 2023-01-15 NOTE — Assessment & Plan Note (Addendum)
-   Nicotine patch - Counseled on importance of cessation 

## 2023-01-15 NOTE — ED Notes (Signed)
ED TO INPATIENT HANDOFF REPORT  ED Nurse Name and Phone #: Lowella Petties Name/Age/Gender Jesus Walker 62 y.o. male Room/Bed: APA03/APA03  Code Status   Code Status: Full Code  Home/SNF/Other Home Patient oriented to: self, place, time, and situation Is this baseline? Yes   Triage Complete: Triage complete  Chief Complaint Hyponatremia [E87.1]  Triage Note Pt from home with complaints of SOB. EMS states he was wheezing on arrival. Pt has oxygen at home for PRN use. Pt was not using O2 when EMS arrive and sats were lower and mid 80's. EMS applied O2 and gave duoneb in route. Pt's O2 now 97%.    Allergies No Known Allergies  Level of Care/Admitting Diagnosis ED Disposition     ED Disposition  Admit   Condition  --   Comment  Hospital Area: [facility] [[id number]]  Level of Care: Telemetry [5]  Covid Evaluation: Asymptomatic - no recent exposure (last 10 days) testing not required  Diagnosis: Hyponatremia [621308]  Admitting Physician: Lilyan Gilford [[id number]]  Attending Physician: Lilyan Gilford [[id number]]          B Medical/Surgery History Past Medical History:  Diagnosis Date   Alcohol dependence (HCC)    Asthma    COPD (chronic obstructive pulmonary disease) (HCC)    GERD (gastroesophageal reflux disease)    Hypertension    Substance abuse (HCC)    Past Surgical History:  Procedure Laterality Date   BRONCHIAL BRUSHINGS  01/03/2016   Procedure: BRONCHIAL BRUSHINGS;  Surgeon: Kari Baars, MD;  [facility];  Service: Cardiopulmonary;;   BRONCHIAL WASHINGS  01/03/2016   Procedure: BRONCHIAL WASHINGS;  Surgeon: Kari Baars, MD;  [facility];  Service: Cardiopulmonary;;   FLEXIBLE BRONCHOSCOPY N/A 01/03/2016   Procedure: FLEXIBLE BRONCHOSCOPY;  Surgeon: Kari Baars, MD;  Location: [facility];  Service: Cardiopulmonary;  Laterality: N/A;     A IV Location/Drains/Wounds Patient Lines/Drains/Airways Status      Active Line/Drains/Airways     Name Placement date Placement time Site Days   Peripheral IV 01/15/23 20 G Anterior;Distal;Right;Upper Arm 01/15/23  1503  Arm  less than 1            Intake/Output Last 24 hours  Intake/Output Summary (Last 24 hours) at 01/15/2023 2040 Last data filed at 01/15/2023 1930 Gross per 24 hour  Intake 100 ml  Output 875 ml  Net -775 ml    Labs/Imaging Results for orders placed or performed during the hospital encounter of 01/15/23 (from the past 48 hour(s))  Basic metabolic panel     Status: Abnormal   Collection Time: 01/15/23  3:53 PM  Result Value Ref Range   Sodium 120 (L) 135 - 145 mmol/L   Potassium 3.8 3.5 - 5.1 mmol/L   Chloride 85 (L) 98 - 111 mmol/L   CO2 24 22 - 32 mmol/L   Glucose, Bld 90 70 - 99 mg/dL    Comment: Glucose reference range applies only to samples taken after fasting for at least 8 hours.   BUN 5 (L) 8 - 23 mg/dL   Creatinine, Ser 1.32 0.61 - 1.24 mg/dL   Calcium 8.3 (L) 8.9 - 10.3 mg/dL   GFR, Estimated >44 >01 mL/min    Comment: (NOTE) Calculated using the CKD-EPI Creatinine Equation (2021)    Anion gap 11 5 - 15    Comment: Performed at [facility], [address], [place], [place] [address]  CBC with Differential  Status: Abnormal   Collection Time: 01/15/23  3:53 PM  Result Value Ref Range   WBC 4.1 4.0 - 10.5 K/uL   RBC 4.14 (L) 4.22 - 5.81 MIL/uL   Hemoglobin 14.6 13.0 - 17.0 g/dL   HCT 96.0 45.4 - 09.8 %   MCV 98.8 80.0 - 100.0 fL   MCH 35.3 (H) 26.0 - 34.0 pg   MCHC 35.7 30.0 - 36.0 g/dL   RDW 11.9 14.7 - 82.9 %   Platelets 166 150 - 400 K/uL   nRBC 0.0 0.0 - 0.2 %   Neutrophils Relative % 41 %   Neutro Abs 1.7 1.7 - 7.7 K/uL   Lymphocytes Relative 33 %   Lymphs Abs 1.3 0.7 - 4.0 K/uL   Monocytes Relative 13 %   Monocytes Absolute 0.5 0.1 - 1.0 K/uL   Eosinophils Relative 12 %   Eosinophils Absolute 0.5 0.0 - 0.5 K/uL   Basophils Relative 1 %   Basophils Absolute 0.0 0.0 - 0.1 K/uL    Immature Granulocytes 0 %   Abs Immature Granulocytes 0.00 0.00 - 0.07 K/uL    Comment: Performed at [facility], [address] [place], [place] [address]  Troponin I (High Sensitivity)     Status: None   Collection Time: 01/15/23  3:53 PM  Result Value Ref Range   Troponin I (High Sensitivity) 6 <18 ng/L    Comment: (NOTE) Elevated high sensitivity troponin I (hsTnI) values and significant  changes across serial measurements may suggest ACS but many other  chronic and acute conditions are known to elevate hsTnI results.  Refer to the "Links" section for chest pain algorithms and additional  guidance. Performed at [facility], [address], [place], [place] [address]   Urinalysis, Routine w reflex microscopic -Urine, Clean Catch     Status: Abnormal   Collection Time: 01/15/23  4:18 PM  Result Value Ref Range   Color, Urine STRAW (A) YELLOW   APPearance CLEAR CLEAR   Specific Gravity, Urine 1.005 1.005 - 1.030   pH 7.0 5.0 - 8.0   Glucose, UA NEGATIVE NEGATIVE mg/dL   Hgb urine dipstick NEGATIVE NEGATIVE   Bilirubin Urine NEGATIVE NEGATIVE   Ketones, ur NEGATIVE NEGATIVE mg/dL   Protein, ur NEGATIVE NEGATIVE mg/dL   Nitrite NEGATIVE NEGATIVE   Leukocytes,Ua NEGATIVE NEGATIVE    Comment: Performed at [facility], [address], [place], [place] [address]  Troponin I (High Sensitivity)     Status: None   Collection Time: 01/15/23  6:31 PM  Result Value Ref Range   Troponin I (High Sensitivity) 5 <18 ng/L    Comment: (NOTE) Elevated high sensitivity troponin I (hsTnI) values and significant  changes across serial measurements may suggest ACS but many other  chronic and acute conditions are known to elevate hsTnI results.  Refer to the "Links" section for chest pain algorithms and additional  guidance. Performed at [facility], [address] [place], [place] [address]   Blood gas, venous     Status: None   Collection Time: 01/15/23  8:21 PM  Result Value Ref Range    pH, Ven 7.36 7.25 - 7.43   pCO2, Ven 49 44 - 60 mmHg   pO2, Ven 41 32 - 45 mmHg   Bicarbonate 27.6 20.0 - 28.0 mmol/L   Acid-Base Excess 1.0 0.0 - 2.0 mmol/L   O2 Saturation 72.5 %   Patient temperature 36.4    Collection site BLOOD RIGHT HAND    Drawn  by 1528     Comment: Performed at [facility], [address], [place], [place] [address]   DG Chest 2 View  Result Date: 01/15/2023 CLINICAL DATA:  Shortness of breath.  Wheezing. EXAM: CHEST - 2 VIEW COMPARISON:  Chest x-ray 08/15/2022 and older.  CT 10/22/2022 FINDINGS: Stable elevation of the right hemidiaphragm with the adjacent atelectasis. No pneumothorax, effusion or edema. Normal cardiopericardial silhouette. Overlapping cardiac leads. Stable small area of scar atelectasis in the left midlung. IMPRESSION: Stable elevated right hemidiaphragm with the adjacent atelectasis or scar. No acute cardiopulmonary disease Electronically Signed   By: Karen Kays M.D.   On: 01/15/2023 18:37    Pending Labs Unresulted Labs (From admission, onward)     Start     Ordered   01/15/23 2030  Basic metabolic panel  Once,   STAT        01/15/23 2030   01/15/23 2030  Procalcitonin  Once,   STAT       References:    Procalcitonin Lower Respiratory Tract Infection AND Sepsis Procalcitonin Algorithm   01/15/23 2030   01/15/23 1704  Resp panel by RT-PCR (RSV, Flu A&B, Covid) Anterior Nasal Swab  Once,   URGENT        01/15/23 1703   Signed and Held  Expectorated Sputum Assessment w Gram Stain, Rflx to Resp Cult  (COPD / Pneumonia / Cellulitis / Lower Extremity Wound)  Once,   R        Signed and Held   Signed and Held  Comprehensive metabolic panel  Tomorrow morning,   R        Signed and Held   Signed and Held  Magnesium  Tomorrow morning,   R        Signed and Held   Signed and Held  CBC with Differential/Platelet  Tomorrow morning,   R        Signed and Held            Vitals/Pain Today's Vitals   01/15/23 1845 01/15/23 1926 01/15/23 1945  01/15/23 2015  BP: (!) 141/87  114/65 137/65  Pulse: 95  90 99  Resp: (!) 36  11 (!) 22  Temp: 98 F (36.7 C)   97.6 F (36.4 C)  TempSrc: Oral   Oral  SpO2: 100%  100% 97%  Weight:      Height:      PainSc:  3       Isolation Precautions No active isolations  Medications Medications  nicotine (NICODERM CQ - dosed in mg/24 hours) patch 21 mg (has no administration in time range)  methylPREDNISolone sodium succinate (SOLU-MEDROL) 125 mg/2 mL injection 125 mg (125 mg Intravenous Given 01/15/23 1611)  ipratropium (ATROVENT) nebulizer solution 0.5 mg (0.5 mg Nebulization Given 01/15/23 1612)  azithromycin (ZITHROMAX) tablet 500 mg (500 mg Oral Given 01/15/23 1734)  cefTRIAXone (ROCEPHIN) 1 g in sodium chloride 0.9 % 100 mL IVPB (0 g Intravenous Stopped 01/15/23 1807)  sodium chloride 0.9 % bolus 1,000 mL (1,000 mLs Intravenous New Bag/Given 01/15/23 1744)  albuterol (PROVENTIL) (2.5 MG/3ML) 0.083% nebulizer solution (10 mg  Given 01/15/23 1734)    Mobility walks with person assist     Focused Assessments Pulmonary Assessment Handoff:  Lung sounds: Bilateral Breath Sounds: Diminished, Expiratory wheezes L Breath Sounds: Expiratory wheezes, Diminished R Breath Sounds: Expiratory wheezes, Diminished O2 Device: Nasal Cannula O2 Flow Rate (L/min): 3 L/min    R Recommendations: See Admitting Provider Note  Report given  to:   Additional Notes:

## 2023-01-15 NOTE — Assessment & Plan Note (Signed)
-   Continue Norvasc - Continue to monitor

## 2023-01-15 NOTE — Assessment & Plan Note (Signed)
-   CIWA protocol 

## 2023-01-15 NOTE — ED Notes (Signed)
MD notified that of most recent sodium level of 126.

## 2023-01-15 NOTE — ED Triage Notes (Signed)
Pt from home with complaints of SOB. EMS states he was wheezing on arrival. Pt has oxygen at home for PRN use. Pt was not using O2 when EMS arrive and sats were lower and mid 80's. EMS applied O2 and gave duoneb in route. Pt's O2 now 97%.

## 2023-01-15 NOTE — ED Notes (Addendum)
Family updated as to patient's status with permission from patient. [place]ill[place] (wife) requests to be updated when admission status is decided.   [phone]

## 2023-01-15 NOTE — Assessment & Plan Note (Signed)
-  2L [place] at baseline -3LNC now -2/2 COPD exacerbation -See separate assessment and plan for COPD exacerbation

## 2023-01-15 NOTE — Assessment & Plan Note (Addendum)
-  Increase in O2 requirement from 2L baseline to 3L [place] -Improved after steroids and nebs in the ED -Continue steroids and nebs -Low suspicion for superimposed infection -Sputum culture pending -Procal undetectable -Rocephin and Zithromax given in the ED, holding off on further antibiotics at this time -Continue to monitor

## 2023-01-15 NOTE — Assessment & Plan Note (Signed)
-  Na 120 -Has been as low as 116 in the past -possibly beer potomania -Last Na 130 08/17/22 -1L NS in ED -Stat BMP at admission -Will start fluids based on admission BMP -Monitor on tele

## 2023-01-15 NOTE — Assessment & Plan Note (Signed)
-  Cl 85 -1 L NS in the ED -Repeat BMP pending -Continue to monitor

## 2023-01-15 NOTE — ED Provider Notes (Signed)
[place] EMERGENCY DEPARTMENT AT [facility] Provider Note   CSN: [id number] Arrival date & time: 01/15/23  1454     History  Chief Complaint  Patient presents with   Flank Pain    Left flank    Chest Pain    s   Shortness of Breath    Jesus Walker is a 63 y.o. male.  PMH of COPD and asthma.  Also history of alcohol abuse, about 6 beers a day and he is currently a smoker.  On oxygen at home as needed.  Presents the ER today for several days of worsening wheezing, cough, shortness of breath.  He also reports increased sputum production with thick yellow sputum which is normally thinner and white.  No fever.  No nausea or vomiting.  Called EMS today due to increased shortness of breath.  Per EMS he was satting in the mid 80s but was on room air, the gave him a DuoNeb and around and put him on 2 L, satting 97% on 2 L now.  He is feeling somewhat better also having some left-sided chest pain.  Denies hemoptysis.   Flank Pain Associated symptoms include chest pain and shortness of breath.  Chest Pain Associated symptoms: shortness of breath   Shortness of Breath Associated symptoms: chest pain        Home Medications Prior to Admission medications   Medication Sig Start Date End Date Taking? Authorizing Provider  acetaminophen (TYLENOL) 500 MG tablet Take 500 mg by mouth every 6 (six) hours as needed for mild pain.    [provider]  amLODipine (NORVASC) 10 MG tablet Take 10 mg by mouth daily. 05/31/21   [provider]  atorvastatin (LIPITOR) 10 MG tablet Take 10 mg by mouth daily. 03/04/22   [provider]  budesonide-formoterol (SYMBICORT) 160-4.5 MCG/ACT inhaler Inhale 2 puffs into the lungs 2 (two) times daily. 06/14/18   Vassie Loll, MD  folic acid (FOLVITE) 1 MG tablet Take 1 tablet (1 mg total) by mouth daily. 08/17/22   Johnson, Clanford L, MD  ipratropium-albuterol (DUONEB) 0.5-2.5 (3) MG/3ML SOLN Take 3 mLs by nebulization every  4 (four) hours as needed (SOB/wheezing). 08/17/22   Johnson, Clanford L, MD  metoprolol tartrate (LOPRESSOR) 25 MG tablet Take 1 tablet (25 mg total) by mouth 2 (two) times daily. 08/17/22   Cleora Fleet, MD  Multiple Vitamin (MULTIVITAMIN WITH MINERALS) TABS tablet Take 1 tablet by mouth daily. 08/18/22   Johnson, Clanford L, MD  omeprazole (PRILOSEC) 20 MG capsule Take 20 mg by mouth daily.    [provider]  PROAIR HFA 108 [phone] Base) MCG/ACT inhaler Inhale 2 puffs into the lungs 4 (four) times daily. 12/26/19   [provider]  thiamine (VITAMIN B-1) 100 MG tablet Take 1 tablet (100 mg total) by mouth daily. 08/17/22   Cleora Fleet, MD      Allergies    Patient has no known allergies.    Review of Systems   Review of Systems  Respiratory:  Positive for shortness of breath.   Cardiovascular:  Positive for chest pain.  Genitourinary:  Positive for flank pain.    Physical Exam Updated Vital Signs BP (!) 153/75 (BP Location: Right Arm)   Pulse 83   Temp 98.1 F (36.7 C) (Oral)   Resp (!) 21   Ht 5\' 2"  (1.575 m)   Wt 57.6 kg   SpO2 100%   BMI 23.23 kg/m  Physical Exam  Vitals and nursing note reviewed.  Constitutional:      General: He is not in acute distress.    Appearance: He is well-developed.  HENT:     Head: Normocephalic and atraumatic.  Eyes:     Conjunctiva/sclera: Conjunctivae normal.  Cardiovascular:     Rate and Rhythm: Normal rate and regular rhythm.     Heart sounds: No murmur heard. Pulmonary:     Effort: Pulmonary effort is normal. Tachypnea present. No respiratory distress.     Breath sounds: Examination of the right-upper field reveals wheezing. Examination of the left-upper field reveals wheezing. Examination of the right-middle field reveals wheezing. Examination of the left-middle field reveals wheezing. Examination of the right-lower field reveals wheezing. Examination of the left-lower field reveals wheezing. Wheezing present.   Abdominal:     Palpations: Abdomen is soft.     Tenderness: There is no abdominal tenderness.  Musculoskeletal:        General: No swelling.     Cervical back: Neck supple.  Skin:    General: Skin is warm and dry.     Capillary Refill: Capillary refill takes less than 2 seconds.  Neurological:     Mental Status: He is alert.  Psychiatric:        Mood and Affect: Mood normal.     ED Results / Procedures / Treatments   Labs (all labs ordered are listed, but only abnormal results are displayed) Labs Reviewed  BASIC METABOLIC PANEL  CBC WITH DIFFERENTIAL/PLATELET  URINALYSIS, ROUTINE W REFLEX MICROSCOPIC  TROPONIN I (HIGH SENSITIVITY)    EKG EKG Interpretation Date/Time:  Thursday January 15 2023 15:03:53 EDT Ventricular Rate:  82 PR Interval:  123 QRS Duration:  81 QT Interval:  379 QTC Calculation: 443 R Axis:   86  Text Interpretation: Sinus rhythm Borderline right axis deviation Anteroseptal infarct, old no acute ST/T changes Confirmed by Pricilla Loveless [phone]) on 01/15/2023 3:07:54 PM  Radiology No results found.  Procedures Procedures    Medications Ordered in ED Medications  methylPREDNISolone sodium succinate (SOLU-MEDROL) 125 mg/2 mL injection 125 mg (has no administration in time range)  albuterol (PROVENTIL,VENTOLIN) solution continuous neb (has no administration in time range)  ipratropium (ATROVENT) nebulizer solution 0.5 mg (has no administration in time range)    ED Course/ Medical Decision Making/ A&P                                 Medical Decision Making This patient presents to the ED for concern of cough, shortness of breath, this involves an extensive number of treatment options, and is a complaint that carries with it a high risk of complications and morbidity.  The differential diagnosis includes COPD exacerbation, pneumonia, PE, pneumothorax, other   Co morbidities that complicate the patient evaluation :   Call abuse, tobacco  abuse   Additional history obtained:  Additional history obtained from MR External records from outside source obtained and reviewed including notes and labs including admission in March 2024 for COPD exacerbation with severe hyponatremia (Na 116 likely due to beer consumption)   Lab Tests:  I Ordered, and personally interpreted labs.  The pertinent results include: Sodium 120, no leukocytosis, no UTI, troponin negative   Imaging Studies ordered:  I ordered imaging studies including chest x-ray which shows Hemi right diaphragm similar to previous no acute infiltrate, pneumothorax, pulmonary edema I independently visualized and interpreted imaging within scope of identifying emergent  findings  I agree with the radiologist interpretation   Cardiac Monitoring: / EKG:  The patient was maintained on a cardiac monitor.  I personally viewed and interpreted the cardiac monitored which showed an underlying rhythm of: Normal sinus   Consultations Obtained:  I requested consultation with the specialist Dr. Carren Rang,  and discussed lab and imaging findings as well as pertinent plan - they recommend: Admission   Problem List / ED Course / Critical interventions / Medication management  Again with COPD exacerbation symptoms, he continues to smoke, having purulent sputum so was treated for And given nebulizer treatments, breathing much improved and saturations improved as well.  Unfortunately patient is again significantly hyponatremic with sodium of 120, not significantly symptomatic of this.  He continues to abuse alcohol.  Given gentle IV fluids I ordered medication including Solu-Medrol, albuterol and ipratropium for wheezing and shortness of breath Reevaluation of the patient after these medicines showed that the patient improved I have reviewed the patients home medicines and have made adjustments as needed       Amount and/or Complexity of Data Reviewed Labs:  ordered. Radiology: ordered.  Risk Prescription drug management. Decision regarding hospitalization.           Final Clinical Impression(s) / ED Diagnoses Final diagnoses:  None    Rx / DC Orders ED Discharge Orders     None         Josem Kaufmann 01/15/23 2328    Bethann Berkshire, MD 01/16/23 1012

## 2023-01-16 DIAGNOSIS — F1029 Alcohol dependence with unspecified alcohol-induced disorder: Secondary | ICD-10-CM | POA: Diagnosis not present

## 2023-01-16 DIAGNOSIS — J9621 Acute and chronic respiratory failure with hypoxia: Secondary | ICD-10-CM | POA: Diagnosis not present

## 2023-01-16 DIAGNOSIS — J441 Chronic obstructive pulmonary disease with (acute) exacerbation: Secondary | ICD-10-CM

## 2023-01-16 DIAGNOSIS — E878 Other disorders of electrolyte and fluid balance, not elsewhere classified: Secondary | ICD-10-CM

## 2023-01-16 DIAGNOSIS — F17200 Nicotine dependence, unspecified, uncomplicated: Secondary | ICD-10-CM

## 2023-01-16 DIAGNOSIS — E871 Hypo-osmolality and hyponatremia: Principal | ICD-10-CM

## 2023-01-16 DIAGNOSIS — K219 Gastro-esophageal reflux disease without esophagitis: Secondary | ICD-10-CM

## 2023-01-16 DIAGNOSIS — I10 Essential (primary) hypertension: Secondary | ICD-10-CM

## 2023-01-16 LAB — COMPREHENSIVE METABOLIC PANEL
ALT: 19 U/L (ref 0–44)
AST: 28 U/L (ref 15–41)
Albumin: 3.1 g/dL — ABNORMAL LOW (ref 3.5–5.0)
Alkaline Phosphatase: 126 U/L (ref 38–126)
Anion gap: 9 (ref 5–15)
BUN: 6 mg/dL — ABNORMAL LOW (ref 8–23)
CO2: 25 mmol/L (ref 22–32)
Calcium: 8.3 mg/dL — ABNORMAL LOW (ref 8.9–10.3)
Chloride: 95 mmol/L — ABNORMAL LOW (ref 98–111)
Creatinine, Ser: 0.84 mg/dL (ref 0.61–1.24)
GFR, Estimated: >60 mL/min (ref >60)
Glucose, Bld: 137 mg/dL — ABNORMAL HIGH (ref 70–99)
Potassium: 4.2 mmol/L (ref 3.5–5.1)
Sodium: 129 mmol/L — ABNORMAL LOW (ref 135–145)
Total Bilirubin: 1.1 mg/dL (ref 0.3–1.2)
Total Protein: 6.3 g/dL — ABNORMAL LOW (ref 6.5–8.1)

## 2023-01-16 LAB — CBC WITH DIFFERENTIAL/PLATELET
Abs Immature Granulocytes: 0.01 10*3/uL (ref 0.00–0.07)
Basophils Absolute: 0 10*3/uL (ref 0.0–0.1)
Basophils Relative: 0 %
Eosinophils Absolute: 0 10*3/uL (ref 0.0–0.5)
Eosinophils Relative: 0 %
HCT: 44 % (ref 39.0–52.0)
Hemoglobin: 15.1 g/dL (ref 13.0–17.0)
Immature Granulocytes: 0 %
Lymphocytes Relative: 14 %
Lymphs Abs: 0.5 10*3/uL — ABNORMAL LOW (ref 0.7–4.0)
MCH: 35.3 pg — ABNORMAL HIGH (ref 26.0–34.0)
MCHC: 34.3 g/dL (ref 30.0–36.0)
MCV: 102.8 fL — ABNORMAL HIGH (ref 80.0–100.0)
Monocytes Absolute: 0.1 10*3/uL (ref 0.1–1.0)
Monocytes Relative: 2 %
Neutro Abs: 2.7 10*3/uL (ref 1.7–7.7)
Neutrophils Relative %: 84 %
Platelets: 164 10*3/uL (ref 150–400)
RBC: 4.28 MIL/uL (ref 4.22–5.81)
RDW: 13.4 % (ref 11.5–15.5)
WBC: 3.2 10*3/uL — ABNORMAL LOW (ref 4.0–10.5)
nRBC: 0 % (ref 0.0–0.2)

## 2023-01-16 LAB — MAGNESIUM: Magnesium: 1.7 mg/dL (ref 1.7–2.4)

## 2023-01-16 LAB — OSMOLALITY, URINE: Osmolality, Ur: 258 mosm/kg — ABNORMAL LOW (ref 300–900)

## 2023-01-16 MED ORDER — PREDNISONE 20 MG PO TABS: 40.0000 mg | ORAL_TABLET | Freq: Every day | ORAL | 0 refills | Status: AC

## 2023-01-16 MED ORDER — VITAMIN B-1 100 MG PO TABS: 100.0000 mg | ORAL_TABLET | Freq: Every day | ORAL | 0 refills | Status: DC

## 2023-01-16 MED ORDER — ADULT MULTIVITAMIN W/MINERALS CH: 1.0000 | ORAL_TABLET | Freq: Every day | ORAL | 0 refills | Status: DC

## 2023-01-16 NOTE — Discharge Summary (Signed)
Physician Discharge Summary   Patient: Jesus Walker MRN: [id number] DOB: 01-Oct-1960  Admit date:     01/15/2023  Discharge date: 01/16/23  Discharge Physician: Arnetha Courser   PCP: Benetta Spar, MD   Recommendations at discharge:  Please obtain CBC and BMP in 1 week Patient need continuation of counseling for alcohol cessation Continue home oxygen Patient has baseline hyponatremia likely secondary to excessive alcohol use Patient was given 4 more days of prednisone for concern of mild COPD exacerbation. Follow-up with primary care provider within a week  Discharge Diagnoses: Principal Problem:   Hyponatremia Active Problems:   COPD exacerbation (HCC)   Alcohol dependence (HCC)   Tobacco dependence   Hypochloremia   HTN (hypertension)   GERD (gastroesophageal reflux disease)   Acute on chronic respiratory failure with hypoxia [facility])   Hospital Course: Taken from H&P.   Jesus Walker is a 62 y.o. male with medical history significant of alcohol dependence, COPD on 2 L of oxygen at home , GERD, hypertension, substance abuse, presents to ED with a chief complaint of dyspnea for the last couple of days, also left-sided chest tightness.   Patient smokes half a pack per day. He drinks 12 beers per day.  Multiple hospitalizations for the same reason over the past 1 year  Patient was hemodynamically stable on arrival, borderline leukopenia, sodium initially 120 which quickly improved to 126 after getting normal saline in ED. EKG without any significant abnormality and NSR.  Chest x-ray with no acute abnormality. Also concern of COPD exacerbation. Received Solu-Medrol, Zithromax and ceftriaxone in ED.  Further antibiotics were held as he has negative procalcitonin and no concern on chest x-ray.  8/16: Vital stable, saturating 100% on 2 L of oxygen which is his baseline.  Sodium improved to 129.  Appears to have chronic hyponatremia likely secondary to excessive alcohol  intake.  No wheezing and he appears very comfortable.  Counseling was again provided.  He was given 4 more days of prednisone for concern of COPD exacerbation.  He will continue his home inhalers and bronchodilators.  He was again counseled extensively for alcohol cessation.  Patient will continue on current medications and need to have a close follow-up with his providers for further recommendations.   Consultants: None Procedures performed: None  Disposition: Home Diet recommendation:  Discharge Diet Orders (From admission, onward)     Start     Ordered   01/16/23 0000  Diet - low sodium heart healthy        01/16/23 1349           Regular diet DISCHARGE MEDICATION: Allergies as of 01/16/2023   No Known Allergies      Medication List     TAKE these medications    acetaminophen 500 MG tablet Commonly known as: TYLENOL Take 500 mg by mouth every 6 (six) hours as needed for mild pain.   amLODipine 10 MG tablet Commonly known as: NORVASC Take 10 mg by mouth daily.   atorvastatin 10 MG tablet Commonly known as: LIPITOR Take 10 mg by mouth daily.   budesonide-formoterol 160-4.5 MCG/ACT inhaler Commonly known as: Symbicort Inhale 2 puffs into the lungs 2 (two) times daily.   folic acid 1 MG tablet Commonly known as: FOLVITE Take 1 tablet (1 mg total) by mouth daily.   ipratropium-albuterol 0.5-2.5 (3) MG/3ML Soln Commonly known as: DUONEB Take 3 mLs by nebulization every 4 (four) hours as needed (SOB/wheezing).   multivitamin with minerals Tabs tablet  Take 1 tablet by mouth daily. Start taking on: January 17, 2023   naproxen 500 MG tablet Commonly known as: NAPROSYN Take 500 mg by mouth daily.   omeprazole 20 MG capsule Commonly known as: PRILOSEC Take 20 mg by mouth daily.   predniSONE 20 MG tablet Commonly known as: DELTASONE Take 2 tablets (40 mg total) by mouth daily with breakfast for 4 days. Start taking on: January 17, 2023   ProAir HFA 108  (90 Base) MCG/ACT inhaler Generic drug: albuterol Inhale 2 puffs into the lungs 4 (four) times daily.   albuterol (2.5 MG/3ML) 0.083% nebulizer solution Commonly known as: PROVENTIL Take 2.5 mg by nebulization 4 (four) times daily.   thiamine 100 MG tablet Commonly known as: Vitamin B-1 Take 1 tablet (100 mg total) by mouth daily. Start taking on: January 17, 2023        Follow-up Information     Fanta, Wayland Salinas, MD. Schedule an appointment as soon as possible for a visit in 1 week(s).   Specialty: Internal Medicine Contact information: [address] [place] [address] [phone]                Discharge Exam: Ceasar Mons Weights   01/15/23 1508 01/15/23 2135  Weight: 57.6 kg 55.1 kg   General.  Malnourished gentleman, in no acute distress. Pulmonary.  Lungs clear bilaterally, normal respiratory effort. CV.  Regular rate and rhythm, no JVD, rub or murmur. Abdomen.  Soft, nontender, nondistended, BS positive. CNS.  Alert and oriented .  No focal neurologic deficit. Extremities.  No edema, no cyanosis, pulses intact and symmetrical. Psychiatry.  Judgment and insight appears normal.   Condition at discharge: stable  The results of significant diagnostics from this hospitalization (including imaging, microbiology, ancillary and laboratory) are listed below for reference.   Imaging Studies: DG Chest 2 View  Result Date: 01/15/2023 CLINICAL DATA:  Shortness of breath.  Wheezing. EXAM: CHEST - 2 VIEW COMPARISON:  Chest x-ray 08/15/2022 and older.  CT 10/22/2022 FINDINGS: Stable elevation of the right hemidiaphragm with the adjacent atelectasis. No pneumothorax, effusion or edema. Normal cardiopericardial silhouette. Overlapping cardiac leads. Stable small area of scar atelectasis in the left midlung. IMPRESSION: Stable elevated right hemidiaphragm with the adjacent atelectasis or scar. No acute cardiopulmonary disease Electronically Signed   By: Karen Kays M.D.   On: 01/15/2023 18:37    Microbiology: Results for orders placed or performed during the hospital encounter of 01/15/23  Resp panel by RT-PCR (RSV, Flu A&B, Covid) Anterior Nasal Swab     Status: None   Collection Time: 01/15/23  5:04 PM   Specimen: Anterior Nasal Swab  Result Value Ref Range Status   SARS Coronavirus 2 by RT PCR NEGATIVE NEGATIVE Final    Comment: (NOTE) SARS-CoV-2 target nucleic acids are NOT DETECTED.  The SARS-CoV-2 RNA is generally detectable in upper respiratory specimens during the acute phase of infection. The lowest concentration of SARS-CoV-2 viral copies this assay can detect is 138 copies/mL. A negative result does not preclude SARS-Cov-2 infection and should not be used as the sole basis for treatment or other patient management decisions. A negative result may occur with  improper specimen collection/handling, submission of specimen other than nasopharyngeal swab, presence of viral mutation(s) within the areas targeted by this assay, and inadequate number of viral copies(<138 copies/mL). A negative result must be combined with clinical observations, patient history, and epidemiological information. The expected result is Negative.  Fact Sheet for Patients:  BloggerCourse.com  Fact Sheet for Healthcare Providers:  SeriousBroker.it  This test is no t yet approved or cleared by the [place] FDA and  has been authorized for detection and/or diagnosis of SARS-CoV-2 by FDA under an Emergency Use Authorization (EUA). This EUA will remain  in effect (meaning this test can be used) for the duration of the COVID-19 declaration under Section 564(b)(1) of the Act, 21 U.S.C.section 360bbb-3(b)(1), unless the authorization is terminated  or revoked sooner.       Influenza A by PCR NEGATIVE NEGATIVE Final   Influenza B by PCR NEGATIVE NEGATIVE Final    Comment: (NOTE) The Xpert Xpress  SARS-CoV-2/FLU/RSV plus assay is intended as an aid in the diagnosis of influenza from Nasopharyngeal swab specimens and should not be used as a sole basis for treatment. Nasal washings and aspirates are unacceptable for Xpert Xpress SARS-CoV-2/FLU/RSV testing.  Fact Sheet for Patients: BloggerCourse.com  Fact Sheet for Healthcare Providers: SeriousBroker.it  This test is not yet approved or cleared by the [place] FDA and has been authorized for detection and/or diagnosis of SARS-CoV-2 by FDA under an Emergency Use Authorization (EUA). This EUA will remain in effect (meaning this test can be used) for the duration of the COVID-19 declaration under Section 564(b)(1) of the Act, 21 U.S.C. section 360bbb-3(b)(1), unless the authorization is terminated or revoked.     Resp Syncytial Virus by PCR NEGATIVE NEGATIVE Final    Comment: (NOTE) Fact Sheet for Patients: BloggerCourse.com  Fact Sheet for Healthcare Providers: SeriousBroker.it  This test is not yet approved or cleared by the [place] FDA and has been authorized for detection and/or diagnosis of SARS-CoV-2 by FDA under an Emergency Use Authorization (EUA). This EUA will remain in effect (meaning this test can be used) for the duration of the COVID-19 declaration under Section 564(b)(1) of the Act, 21 U.S.C. section 360bbb-3(b)(1), unless the authorization is terminated or revoked.  Performed at [facility], [address], [place], [place] [address]     Labs: CBC: Recent Labs  Lab 01/15/23 1553 01/16/23 0429  WBC 4.1 3.2*  NEUTROABS 1.7 2.7  HGB 14.6 15.1  HCT 40.9 44.0  MCV 98.8 102.8*  PLT 166 164   Basic Metabolic Panel: Recent Labs  Lab 01/15/23 1553 01/15/23 2021 01/16/23 0429  NA 120* 126* 129*  K 3.8 3.8 4.2  CL 85* 90* 95*  CO2 24 23 25   GLUCOSE 90 119* 137*  BUN 5* 5* 6*  CREATININE  0.74 0.79 0.84  CALCIUM 8.3* 8.5* 8.3*  MG  --   --  1.7   Liver Function Tests: Recent Labs  Lab 01/16/23 0429  AST 28  ALT 19  ALKPHOS 126  BILITOT 1.1  PROT 6.3*  ALBUMIN 3.1*   CBG: No results for input(s): "GLUCAP" in the last 168 hours.  Discharge time spent: greater than 30 minutes.  This record has been created using Conservation officer, historic buildings. Errors have been sought and corrected,but may not always be located. Such creation errors do not reflect on the standard of care.   Signed: Arnetha Courser, MD [facility] 01/16/2023

## 2023-01-16 NOTE — Discharge Instructions (Signed)
Substance Abuse Resources:   Outpatient Substance Use Treatment Services  Agency Name: [facility] Address: [address] [place], [place] [address] Phone: [phone] / [phone] Website: [email or web address] Services Offered: Support groups for Bipolar, substance abuse, anger management, panic depression and anxiety. Mobile crisis unit, outpatient therapy, substance abuse treatment.   [facility] [address] [place], [place] [address] [phone] Private insurance, Medicare A&B, and GCCN  [facility] [address] [place], [place] [address] [phone] Medicaid, Self Pay  [facility] [address] [place], [place] [phone] Medicaid and Private Insurance, Self Pay  [facility] [address] [place], [place] [phone] Private Insurance, and Self Pay  [facility] [address] [place], [place] [address] [phone] or [phone] Private Insurance Only  [facility] [address] [place], [place] [address] [phone] Medicaid, Medicare, Private Insurance  [facility] [address] [place], [place] [address] [phone] Services are free or reduced  [facility] [address] [phone] Self Pay only, sliding scale  [facility] [address] [place], [place] [address] [phone] ([facility]) Self Pay, Medicaid Only  [facility] [address] [place], [place] [address] [phone] Medicaid, Medicare, Private Insurance  Residential Substance Use Treatment Services  [facility] [address] [place], [place] [address] [phone] or [phone] Detox (Medicare, Medicaid, private insurance, and self  pay) Residential Rehab 14 days (Medicare, Medicaid, private insurance, and self pay)  [facility] [address] [place], [place] [phone] Male and Male Detox (Self Pay and Medicaid limited availability) Rehab only Male (Medicaid and self pay only)  [facility] [address] [place], [place] [address] [phone] or [phone] Detox and Residential Treatment Private Insurance Only   [facility] [address] [place], [place] [address] [phone] Treatment Only, must make assessment appointment, and must be sober for assessment appointment. Self Pay Only, Medicare A&B, [place], [place] ID only! *Transportation assistance offered from [employer] on [address]  [facility] [address] [place], [place] [address] Walk in interviews M-Sat 8-4p No pending legal charges [phone]  [facility]: [facility] [address] [place], [place] [phone] (Self Pay, Medicaid) [facility] [address] [place], [place] [address] [phone] Detox and Residential Treatment Medicare and Private Insurance  [facility] [address] [place], [place] [address] 28 Day Women's Facility: [phone] 28 Day Men's Facility: [phone] Long-term Residential Program: [phone] Males 25 and Over (No Insurance, upfront fee)  [facility] [address] [place], [place] [address] [phone] Private Insurance with [employer], Private Pay [facility] [address] [place],  [address] Local [phone] Private Insurance Only  [facility] [address] [place],  [address] [phone] (Males, upfront fee)  [facility] [place] [address] [place], [address] [phone] Private Insurance       

## 2023-01-16 NOTE — Hospital Course (Addendum)
Taken from H&P.   Jesus Walker is a 62 y.o. male with medical history significant of alcohol dependence, COPD on 2 L of oxygen at home , GERD, hypertension, substance abuse, presents to ED with a chief complaint of dyspnea for the last couple of days, also left-sided chest tightness.   Patient smokes half a pack per day. He drinks 12 beers per day.  Multiple hospitalizations for the same reason over the past 1 year  Patient was hemodynamically stable on arrival, borderline leukopenia, sodium initially 120 which quickly improved to 126 after getting normal saline in ED. EKG without any significant abnormality and NSR.  Chest x-ray with no acute abnormality. Also concern of COPD exacerbation. Received Solu-Medrol, Zithromax and ceftriaxone in ED.  Further antibiotics were held as he has negative procalcitonin and no concern on chest x-ray.  8/16: Vital stable, saturating 100% on 2 L of oxygen which is his baseline.  Sodium improved to 129.  Appears to have chronic hyponatremia likely secondary to excessive alcohol intake.  No wheezing and he appears very comfortable.  Counseling was again provided.  He was given 4 more days of prednisone for concern of COPD exacerbation.  He will continue his home inhalers and bronchodilators.  He was again counseled extensively for alcohol cessation.  Patient will continue on current medications and need to have a close follow-up with his providers for further recommendations.

## 2023-01-16 NOTE — TOC Progression Note (Signed)
Transition of Care [facility]) - Progression Note    Patient Details  Name: Jesus Walker MRN: [id number] Date of Birth: December 04, 1960  Transition of [facility] CM/SW Contact  Annice Needy, LCSW Phone Number: 01/16/2023, 3:33 PM  Clinical Narrative:    Substance misuse resources listed on AVS.    Expected Discharge Plan: Home/Self Care Barriers to Discharge: No Barriers Identified  Expected Discharge Plan and Services         Expected Discharge Date: 01/16/23                                     Social Determinants of Health (SDOH) Interventions SDOH Screenings   Food Insecurity: No Food Insecurity (08/15/2022)  Housing: Low Risk  (08/15/2022)  Transportation Needs: Unmet Transportation Needs (08/15/2022)  Utilities: Not At Risk (08/15/2022)  Tobacco Use: High Risk (01/15/2023)    Readmission Risk Interventions     No data to display

## 2023-01-16 NOTE — H&P (Signed)
History and Physical    Patient: Jesus Walker ZOX:[id number] DOB: 1960/06/11 DOA: 01/15/2023 DOS: the patient was seen and examined on 01/16/2023 PCP: Benetta Spar, MD  Patient coming from: Home  Chief Complaint:  Chief Complaint  Patient presents with   Flank Pain    Left flank    Chest Pain    s   Shortness of Breath   HPI: Jesus Walker is a 61 y.o. male with medical history significant of alcohol dependence, COPD, GERD, hypertension, substance abuse, presents to ED with a chief complaint of dyspnea.  Patient reports he has had dyspnea for the last couple of days.  It is associated with a left tightness in his chest and back.  He reports the tightness is actually constant, but worse with the dyspnea.  He reports his dyspnea is worse with exertion and worse with hot temperatures.  He has been using his nebulizer 3-4 times per day.  Gives temporary relief.  He uses his inhalers as well.  He has had a cough productive of clear sputum.  Is not different from his normal cough.  He denies any fever.  He is on 2 L nasal cannula as needed at home.  Patient reports that he has had some nausea and gagging.  His last meal was yesterday a.m.  He does not have choking with every meal, but does occasionally aspirate.  On review of systems, patient reports that he does have leg swelling sometimes, but reports it has not been that bad recently.  On exam his right leg is slightly more edematous than his left leg, he reports this is normal for him.  Patient smokes half a pack per day.  He drinks 12 beers per day.  He is full code. Review of Systems: As mentioned in the history of present illness. All other systems reviewed and are negative. Past Medical History:  Diagnosis Date   Alcohol dependence (HCC)    Asthma    COPD (chronic obstructive pulmonary disease) (HCC)    GERD (gastroesophageal reflux disease)    Hypertension    Substance abuse (HCC)    Past Surgical History:  Procedure  Laterality Date   BRONCHIAL BRUSHINGS  01/03/2016   Procedure: BRONCHIAL BRUSHINGS;  Surgeon: Kari Baars, MD;  Location: [facility];  Service: Cardiopulmonary;;   BRONCHIAL WASHINGS  01/03/2016   Procedure: BRONCHIAL WASHINGS;  Surgeon: Kari Baars, MD;  Location: [facility];  Service: Cardiopulmonary;;   FLEXIBLE BRONCHOSCOPY N/A 01/03/2016   Procedure: FLEXIBLE BRONCHOSCOPY;  Surgeon: Kari Baars, MD;  Location: [facility];  Service: Cardiopulmonary;  Laterality: N/A;   Social History:  reports that he has been smoking cigarettes. He has never used smokeless tobacco. He reports current alcohol use of about 12.0 standard drinks of alcohol per week. He reports that he does not use drugs.  No Known Allergies  Family History  Problem Relation Age of Onset   Asthma Mother 84   Asthma Father 57    Prior to Admission medications   Medication Sig Start Date End Date Taking? Authorizing Provider  acetaminophen (TYLENOL) 500 MG tablet Take 500 mg by mouth every 6 (six) hours as needed for mild pain.   Yes [provider]  albuterol (PROVENTIL) (2.5 MG/3ML) 0.083% nebulizer solution Take 2.5 mg by nebulization 4 (four) times daily. 01/12/23  Yes [provider]  amLODipine (NORVASC) 10 MG tablet Take 10 mg by mouth daily. 05/31/21  Yes [provider]  atorvastatin (LIPITOR)  10 MG tablet Take 10 mg by mouth daily. 03/04/22  Yes [provider]  budesonide-formoterol (SYMBICORT) 160-4.5 MCG/ACT inhaler Inhale 2 puffs into the lungs 2 (two) times daily. 06/14/18  Yes Vassie Loll, MD  folic acid (FOLVITE) 1 MG tablet Take 1 tablet (1 mg total) by mouth daily. 08/17/22  Yes Johnson, Clanford L, MD  ipratropium-albuterol (DUONEB) 0.5-2.5 (3) MG/3ML SOLN Take 3 mLs by nebulization every 4 (four) hours as needed (SOB/wheezing). 08/17/22  Yes Johnson, Clanford L, MD  naproxen (NAPROSYN) 500 MG tablet Take 500 mg by mouth daily. 12/31/22  Yes [provider]  omeprazole (PRILOSEC) 20 MG capsule Take 20 mg by mouth daily.   Yes [provider]  PROAIR HFA 108 (90 Base) MCG/ACT inhaler Inhale 2 puffs into the lungs 4 (four) times daily. 12/26/19  Yes [provider]    Physical Exam: Vitals:   01/15/23 2135 01/15/23 2254 01/16/23 0207 01/16/23 0525  BP: 128/64  128/72 129/62  Pulse: 93  90 90  Resp: 16     Temp: 98.1 F (36.7 C)  97.8 F (36.6 C) 98 F (36.7 C)  TempSrc: Oral  Oral Oral  SpO2: 99% 100% 100% 100%  Weight: 55.1 kg     Height:       1.  General: Patient lying supine in bed, chronically ill-appearing  2. Psychiatric: Alert and oriented x 3, mood and behavior normal for situation, pleasant and cooperative with exam   3. Neurologic: Speech and language are normal, face is symmetric, moves all 4 extremities voluntarily, at baseline without acute deficits on limited exam   4. HEENMT:  Head is atraumatic, normocephalic, pupils reactive to light, neck is supple, trachea is midline, mucous membranes are moist   5. Respiratory : Diminished in the lower lung fields otherwise lungs are clear to auscultation bilaterally without wheezing, rhonchi, rales, no cyanosis, no increase in work of breathing or accessory muscle use   6. Cardiovascular : Heart rate normal, rhythm is regular, no rubs or gallops, minimal peripheral edema, peripheral pulses palpated   7. Gastrointestinal:  Abdomen is soft, nondistended, nontender to palpation bowel sounds active, no masses or organomegaly palpated   8. Skin:  Skin is warm, dry and intact without rashes, acute lesions, or ulcers on limited exam   9.Musculoskeletal:  Chest is tender to palpation on the left side, no acute deformities or trauma, no asymmetry in tone, minute peripheral edema, peripheral pulses palpated, no tenderness to palpation in the extremities  Data Reviewed: In the ED Patient is afebrile, heart rate is mostly normal 83-95, respiratory  rate is quite tachypneic up to 36, blood pressure 141/68-155/87, satting 96-100% on 3 L nasal cannula Borderline leukopenia at 4.1, hemoglobin stable at 14.6 Sodium initially 120, patient was given IV fluid boluses in the ED, on recheck sodium is 126-holding off on further fluids at this time so as not to overcorrect sodium Trope 6, 5 UA is not indicative of UTI Chest x-ray shows no acute changes EKG shows a heart rate of 82, sinus rhythm, QTc 443 Patient was started on CAP antibiotics, Solu-Medrol, normal saline Admission requested for COPD exacerbation with possible pneumonia  Assessment and Plan: * Hyponatremia -Na 120 -Has been as low as 116 in the past -possibly beer potomania -Last Na 130 08/17/22 -1L NS in ED -Stat BMP at admission -Will start fluids based on admission BMP -Monitor on tele  COPD exacerbation (HCC) -Increase in O2 requirement from 2L baseline to 3L  [place] -Improved after steroids and nebs in the ED -Continue steroids and nebs -Low suspicion for superimposed infection -Sputum culture pending -Procal undetectable -Rocephin and Zithromax given in the ED, holding off on further antibiotics at this time -Continue to monitor  Acute on chronic respiratory failure with hypoxia (HCC) -2L [place] at baseline -3LNC now -2/2 COPD exacerbation -See separate assessment and plan for COPD exacerbation  GERD (gastroesophageal reflux disease) -Continue protonix  HTN (hypertension) -Continue Norvasc -Continue to monitor  Hypochloremia -Cl 85 -1 L NS in the ED -Repeat BMP pending -Continue to monitor  Tobacco dependence -Nicotine patch -Counseled on importance of cessation   Alcohol dependence (HCC) -CIWA protocol      Advance Care Planning:   Code Status: Full Code  Consults: None at this time  Family Communication: No family at bedside  Severity of Illness: The appropriate patient status for this patient is OBSERVATION. Observation status is judged to be  reasonable and necessary in order to provide the required intensity of service to ensure the patient's safety. The patient's presenting symptoms, physical exam findings, and initial radiographic and laboratory data in the context of their medical condition is felt to place them at decreased risk for further clinical deterioration. Furthermore, it is anticipated that the patient will be medically stable for discharge from the hospital within 2 midnights of admission.   Author: Lilyan Gilford, DO 01/16/2023 5:43 AM  For on call review [email or web address].

## 2023-10-07 ENCOUNTER — Encounter: Payer: Self-pay | Admitting: *Deleted

## 2023-10-16 ENCOUNTER — Other Ambulatory Visit (HOSPITAL_COMMUNITY): Payer: Self-pay | Admitting: Gerontology

## 2023-10-16 DIAGNOSIS — Z87891 Personal history of nicotine dependence: Secondary | ICD-10-CM

## 2023-10-29 ENCOUNTER — Other Ambulatory Visit (HOSPITAL_COMMUNITY): Payer: Self-pay | Admitting: Gerontology

## 2023-10-29 ENCOUNTER — Ambulatory Visit (HOSPITAL_COMMUNITY)
Admission: RE | Admit: 2023-10-29 | Discharge: 2023-10-29 | Disposition: A | Discharge status: Home / Self Care | Source: Ambulatory Visit | Attending: Gerontology | Admitting: Gerontology

## 2023-10-29 DIAGNOSIS — M549 Dorsalgia, unspecified: Secondary | ICD-10-CM | POA: Insufficient documentation

## 2023-10-31 ENCOUNTER — Ambulatory Visit (HOSPITAL_COMMUNITY)
Admission: RE | Admit: 2023-10-31 | Discharge: 2023-10-31 | Disposition: A | Discharge status: Home / Self Care | Source: Ambulatory Visit | Attending: Gerontology | Admitting: Gerontology

## 2023-10-31 DIAGNOSIS — Z87891 Personal history of nicotine dependence: Secondary | ICD-10-CM | POA: Insufficient documentation

## 2023-12-09 ENCOUNTER — Observation Stay (HOSPITAL_COMMUNITY)
Admission: EM | Admit: 2023-12-09 | Discharge: 2023-12-10 | Disposition: A | Discharge status: Home / Self Care | Attending: Internal Medicine | Admitting: Internal Medicine

## 2023-12-09 ENCOUNTER — Emergency Department (HOSPITAL_COMMUNITY)

## 2023-12-09 ENCOUNTER — Other Ambulatory Visit: Payer: Self-pay

## 2023-12-09 ENCOUNTER — Encounter (HOSPITAL_COMMUNITY): Payer: Self-pay | Admitting: *Deleted

## 2023-12-09 DIAGNOSIS — R079 Chest pain, unspecified: Secondary | ICD-10-CM | POA: Diagnosis not present

## 2023-12-09 DIAGNOSIS — R0789 Other chest pain: Secondary | ICD-10-CM | POA: Diagnosis not present

## 2023-12-09 DIAGNOSIS — I10 Essential (primary) hypertension: Secondary | ICD-10-CM | POA: Diagnosis not present

## 2023-12-09 DIAGNOSIS — E871 Hypo-osmolality and hyponatremia: Secondary | ICD-10-CM | POA: Diagnosis not present

## 2023-12-09 DIAGNOSIS — F1020 Alcohol dependence, uncomplicated: Secondary | ICD-10-CM | POA: Diagnosis not present

## 2023-12-09 DIAGNOSIS — F17200 Nicotine dependence, unspecified, uncomplicated: Secondary | ICD-10-CM | POA: Diagnosis not present

## 2023-12-09 DIAGNOSIS — R0602 Shortness of breath: Secondary | ICD-10-CM | POA: Diagnosis present

## 2023-12-09 DIAGNOSIS — J441 Chronic obstructive pulmonary disease with (acute) exacerbation: Secondary | ICD-10-CM | POA: Diagnosis not present

## 2023-12-09 DIAGNOSIS — Z79899 Other long term (current) drug therapy: Secondary | ICD-10-CM | POA: Diagnosis not present

## 2023-12-09 DIAGNOSIS — F1029 Alcohol dependence with unspecified alcohol-induced disorder: Secondary | ICD-10-CM

## 2023-12-09 DIAGNOSIS — J45909 Unspecified asthma, uncomplicated: Secondary | ICD-10-CM | POA: Diagnosis not present

## 2023-12-09 LAB — MAGNESIUM: Magnesium: 1.8 mg/dL (ref 1.7–2.4)

## 2023-12-09 LAB — BASIC METABOLIC PANEL WITH GFR
Anion gap: 12 (ref 5–15)
BUN: 7 mg/dL — ABNORMAL LOW (ref 8–23)
CO2: 22 mmol/L (ref 22–32)
Calcium: 9.3 mg/dL (ref 8.9–10.3)
Chloride: 88 mmol/L — ABNORMAL LOW (ref 98–111)
Creatinine, Ser: 0.76 mg/dL (ref 0.61–1.24)
GFR, Estimated: >60 mL/min (ref >60)
Glucose, Bld: 81 mg/dL (ref 70–99)
Potassium: 4.3 mmol/L (ref 3.5–5.1)
Sodium: 122 mmol/L — ABNORMAL LOW (ref 135–145)

## 2023-12-09 LAB — CBC
HCT: 43.1 % (ref 39.0–52.0)
Hemoglobin: 15 g/dL (ref 13.0–17.0)
MCH: 37 pg — ABNORMAL HIGH (ref 26.0–34.0)
MCHC: 34.8 g/dL (ref 30.0–36.0)
MCV: 106.4 fL — ABNORMAL HIGH (ref 80.0–100.0)
Platelets: 202 K/uL (ref 150–400)
RBC: 4.05 MIL/uL — ABNORMAL LOW (ref 4.22–5.81)
RDW: 12.3 % (ref 11.5–15.5)
WBC: 7.5 K/uL (ref 4.0–10.5)
nRBC: 0 % (ref 0.0–0.2)

## 2023-12-09 LAB — TROPONIN I (HIGH SENSITIVITY)
Troponin I (High Sensitivity): 4 ng/L (ref <18)
Troponin I (High Sensitivity): 5 ng/L (ref <18)

## 2023-12-09 LAB — BRAIN NATRIURETIC PEPTIDE: B Natriuretic Peptide: 63 pg/mL (ref 0.0–100.0)

## 2023-12-09 LAB — TSH: TSH: 2.706 u[IU]/mL (ref 0.350–4.500)

## 2023-12-09 MED ORDER — SODIUM CHLORIDE 0.9 % IV BOLUS
500.0000 mL | Freq: Once | INTRAVENOUS | Status: AC
  Administered 2023-12-09: 500 mL via INTRAVENOUS

## 2023-12-09 MED ORDER — METOPROLOL TARTRATE 25 MG PO TABS
25.0000 mg | ORAL_TABLET | Freq: Two times a day (BID) | ORAL | Status: DC
  Administered 2023-12-09 – 2023-12-10 (×2): 25 mg via ORAL
  Filled 2023-12-09 (×2): qty 1

## 2023-12-09 MED ORDER — THIAMINE HCL 100 MG/ML IJ SOLN: 100.0000 mg | Freq: Every day | INTRAMUSCULAR | Status: DC

## 2023-12-09 MED ORDER — IPRATROPIUM-ALBUTEROL 0.5-2.5 (3) MG/3ML IN SOLN
3.0000 mL | Freq: Once | RESPIRATORY_TRACT | Status: AC
  Administered 2023-12-09: 3 mL via RESPIRATORY_TRACT
  Filled 2023-12-09: qty 3

## 2023-12-09 MED ORDER — IPRATROPIUM-ALBUTEROL 0.5-2.5 (3) MG/3ML IN SOLN: 3.0000 mL | RESPIRATORY_TRACT | Status: DC | PRN

## 2023-12-09 MED ORDER — ENOXAPARIN SODIUM 40 MG/0.4ML IJ SOSY
40.0000 mg | PREFILLED_SYRINGE | INTRAMUSCULAR | Status: DC
  Administered 2023-12-09: 40 mg via SUBCUTANEOUS
  Filled 2023-12-09: qty 0.4

## 2023-12-09 MED ORDER — IPRATROPIUM-ALBUTEROL 0.5-2.5 (3) MG/3ML IN SOLN
3.0000 mL | Freq: Three times a day (TID) | RESPIRATORY_TRACT | Status: DC
  Administered 2023-12-10: 3 mL via RESPIRATORY_TRACT
  Filled 2023-12-09: qty 3

## 2023-12-09 MED ORDER — POLYETHYLENE GLYCOL 3350 17 G PO PACK: 17.0000 g | PACK | Freq: Every day | ORAL | Status: DC | PRN

## 2023-12-09 MED ORDER — SODIUM CHLORIDE 0.9 % IV SOLN
500.0000 mg | INTRAVENOUS | Status: AC
  Administered 2023-12-09: 500 mg via INTRAVENOUS
  Filled 2023-12-09: qty 5

## 2023-12-09 MED ORDER — FLUTICASONE FUROATE-VILANTEROL 200-25 MCG/ACT IN AEPB
1.0000 | INHALATION_SPRAY | Freq: Every day | RESPIRATORY_TRACT | Status: DC
  Administered 2023-12-10: 1 via RESPIRATORY_TRACT
  Filled 2023-12-09: qty 28

## 2023-12-09 MED ORDER — ACETAMINOPHEN 650 MG RE SUPP: 650.0000 mg | Freq: Four times a day (QID) | RECTAL | Status: DC | PRN

## 2023-12-09 MED ORDER — PREDNISONE 20 MG PO TABS: 40.0000 mg | ORAL_TABLET | Freq: Every day | ORAL | Status: DC

## 2023-12-09 MED ORDER — THIAMINE MONONITRATE 100 MG PO TABS
100.0000 mg | ORAL_TABLET | Freq: Every day | ORAL | Status: DC
  Administered 2023-12-09 – 2023-12-10 (×2): 100 mg via ORAL
  Filled 2023-12-09 (×2): qty 1

## 2023-12-09 MED ORDER — FLUTICASONE FUROATE-VILANTEROL 200-25 MCG/ACT IN AEPB: 1.0000 | INHALATION_SPRAY | Freq: Every day | RESPIRATORY_TRACT | Status: DC

## 2023-12-09 MED ORDER — ADULT MULTIVITAMIN W/MINERALS CH
1.0000 | ORAL_TABLET | Freq: Every day | ORAL | Status: DC
  Administered 2023-12-09 – 2023-12-10 (×2): 1 via ORAL
  Filled 2023-12-09 (×2): qty 1

## 2023-12-09 MED ORDER — METHYLPREDNISOLONE SODIUM SUCC 125 MG IJ SOLR
125.0000 mg | Freq: Once | INTRAMUSCULAR | Status: AC
  Administered 2023-12-09: 125 mg via INTRAVENOUS
  Filled 2023-12-09: qty 2

## 2023-12-09 MED ORDER — LORAZEPAM 1 MG PO TABS: 1.0000 mg | ORAL_TABLET | ORAL | Status: DC | PRN

## 2023-12-09 MED ORDER — ONDANSETRON HCL 4 MG/2ML IJ SOLN: 4.0000 mg | Freq: Four times a day (QID) | INTRAMUSCULAR | Status: DC | PRN

## 2023-12-09 MED ORDER — LORAZEPAM 2 MG/ML IJ SOLN
1.0000 mg | INTRAMUSCULAR | Status: DC | PRN
  Administered 2023-12-09: 1 mg via INTRAVENOUS
  Filled 2023-12-09: qty 1

## 2023-12-09 MED ORDER — AMLODIPINE BESYLATE 5 MG PO TABS
10.0000 mg | ORAL_TABLET | Freq: Every day | ORAL | Status: DC
  Administered 2023-12-10: 10 mg via ORAL
  Filled 2023-12-09: qty 2

## 2023-12-09 MED ORDER — ONDANSETRON HCL 4 MG PO TABS: 4.0000 mg | ORAL_TABLET | Freq: Four times a day (QID) | ORAL | Status: DC | PRN

## 2023-12-09 MED ORDER — ASPIRIN 81 MG PO TBEC
81.0000 mg | DELAYED_RELEASE_TABLET | Freq: Every day | ORAL | Status: DC
  Administered 2023-12-09 – 2023-12-10 (×2): 81 mg via ORAL
  Filled 2023-12-09 (×2): qty 1

## 2023-12-09 MED ORDER — METHYLPREDNISOLONE SODIUM SUCC 125 MG IJ SOLR
60.0000 mg | Freq: Two times a day (BID) | INTRAMUSCULAR | Status: DC
  Administered 2023-12-10: 60 mg via INTRAVENOUS
  Filled 2023-12-09: qty 2

## 2023-12-09 MED ORDER — ATORVASTATIN CALCIUM 10 MG PO TABS
10.0000 mg | ORAL_TABLET | Freq: Every day | ORAL | Status: DC
  Administered 2023-12-10: 10 mg via ORAL
  Filled 2023-12-09: qty 1

## 2023-12-09 MED ORDER — ACETAMINOPHEN 325 MG PO TABS
650.0000 mg | ORAL_TABLET | Freq: Four times a day (QID) | ORAL | Status: DC | PRN
Start: 2023-12-09 — End: 2023-12-10

## 2023-12-09 MED ORDER — FOLIC ACID 1 MG PO TABS
1.0000 mg | ORAL_TABLET | Freq: Every day | ORAL | Status: DC
  Administered 2023-12-09 – 2023-12-10 (×2): 1 mg via ORAL
  Filled 2023-12-09 (×2): qty 1

## 2023-12-09 MED ORDER — PANTOPRAZOLE SODIUM 40 MG PO TBEC
40.0000 mg | DELAYED_RELEASE_TABLET | Freq: Every day | ORAL | Status: DC
  Administered 2023-12-10: 40 mg via ORAL
  Filled 2023-12-09: qty 1

## 2023-12-09 MED ORDER — GUAIFENESIN-DM 100-10 MG/5ML PO SYRP
15.0000 mL | ORAL_SOLUTION | Freq: Three times a day (TID) | ORAL | Status: DC
  Administered 2023-12-09 – 2023-12-10 (×2): 15 mL via ORAL
  Filled 2023-12-09 (×2): qty 15

## 2023-12-09 MED ORDER — AZITHROMYCIN 250 MG PO TABS: 500.0000 mg | ORAL_TABLET | Freq: Every day | ORAL | Status: DC

## 2023-12-09 NOTE — Assessment & Plan Note (Addendum)
 Left-sided chest pain, appears to be more related to his COPD exacerbation, but also has risk factors for coronary artery disease. EKG non-acute, troponin 4 > 5.  -Resume atorvastatin  - Aspirin  81 mg daily - Echo. -Further workup pending clinical course

## 2023-12-09 NOTE — Assessment & Plan Note (Addendum)
 Drinks at least 2 beers daily.  Last drink was last night-about 24 hours ago. Per sister at bedside- history of delirium tremens.  No seizure history.  At this time no sign of withdrawal. -CIWA as needed -Thiamine  folate multivitamins -Potassium 4.3, check magnesium 

## 2023-12-09 NOTE — ED Provider Notes (Signed)
 [place] EMERGENCY DEPARTMENT AT [facility] Provider Note   CSN: [id number] Arrival date & time: 12/09/23  1044     Patient presents with: Chest Pain   Jesus Walker is a 63 y.o. male.    Chest Pain Associated symptoms: shortness of breath and weakness   Associated symptoms: no abdominal pain, no cough, no dizziness, no fever, no nausea and no vomiting        Jesus Walker is a 63 y.o. male past medical history of asthma, GERD, COPD, alcohol dependence, hypertension who presents to the Emergency Department complaining of chest pain shortness of breath.  Symptoms for several days.  Shortness of breath is not exertional.  Also complains of generalized weakness.  No fever or chills.  No cough.  Does have a history of asthma uses oxygen  as needed and uses his inhalers when needed.  Also endorses intermittent edema of bilateral lower extremities.  States that he wakes up in the morning and his lower legs and ankles appear okay but soon as he stands and walks for any period of time he notices swelling of his lower legs through his ankles and feet.  Prior to Admission medications   Medication Sig Start Date End Date Taking? Authorizing Provider  acetaminophen  (TYLENOL ) 500 MG tablet Take 500 mg by mouth 2 (two) times daily.   Yes [provider]  albuterol  (PROVENTIL ) (2.5 MG/3ML) 0.083% nebulizer solution Take 2.5 mg by nebulization 4 (four) times daily. 01/12/23  Yes [provider]  amLODipine  (NORVASC ) 10 MG tablet Take 10 mg by mouth daily. 05/31/21  Yes [provider]  atorvastatin  (LIPITOR) 10 MG tablet Take 10 mg by mouth daily. 03/04/22  Yes [provider]  budesonide -formoterol  (SYMBICORT ) 160-4.5 MCG/ACT inhaler Inhale 2 puffs into the lungs 2 (two) times daily. 06/14/18  Yes Ricky Fines, MD  folic acid  (FOLVITE ) 1 MG tablet Take 1 tablet (1 mg total) by mouth daily. 08/17/22  Yes Johnson, Clanford L, MD  ipratropium-albuterol   (DUONEB) 0.5-2.5 (3) MG/3ML SOLN Take 3 mLs by nebulization every 4 (four) hours as needed (SOB/wheezing). 08/17/22  Yes Johnson, Clanford L, MD  metoprolol  tartrate (LOPRESSOR ) 25 MG tablet Take 25 mg by mouth 2 (two) times daily. 11/09/23  Yes [provider]  naproxen  (NAPROSYN ) 500 MG tablet Take 500 mg by mouth daily. 12/31/22  Yes [provider]  omeprazole (PRILOSEC) 20 MG capsule Take 20 mg by mouth daily.   Yes [provider]  PROAIR  HFA 108 (90 Base) MCG/ACT inhaler Inhale 2 puffs into the lungs 4 (four) times daily. 12/26/19  Yes [provider]    Allergies: Patient has no known allergies.    Review of Systems  Constitutional:  Negative for appetite change, chills and fever.  Respiratory:  Positive for shortness of breath. Negative for cough and chest tightness.   Cardiovascular:  Positive for chest pain and leg swelling.  Gastrointestinal:  Negative for abdominal pain, nausea and vomiting.  Genitourinary:  Negative for dysuria and flank pain.  Skin:  Negative for color change and wound.  Neurological:  Positive for weakness. Negative for dizziness.  Psychiatric/Behavioral:  Negative for confusion.     Updated Vital Signs BP 132/82   Pulse 79   Temp 98.3 F (36.8 C) (Oral)   Resp 14   Ht 5' 2 (1.575 m)   Wt 59.4 kg   SpO2 92%   BMI 23.96 kg/m   Physical Exam Vitals and nursing note reviewed.  Constitutional:      Appearance: He is well-developed. He is not ill-appearing or toxic-appearing.  HENT:     Mouth/Throat:     Mouth: Mucous membranes are moist.  Cardiovascular:     Rate and Rhythm: Normal rate and regular rhythm.     Pulses: Normal pulses.  Pulmonary:     Effort: Pulmonary effort is normal.     Breath sounds: No wheezing.     Comments: Scattered expiratory wheezes.  No rales no increased work of breathing on exam Abdominal:     Palpations: Abdomen is soft.     Tenderness: There is no abdominal tenderness.   Musculoskeletal:        General: Normal range of motion.  Skin:    General: Skin is warm.     Capillary Refill: Capillary refill takes less than 2 seconds.  Neurological:     General: No focal deficit present.     Mental Status: He is alert.     Sensory: No sensory deficit.     Motor: No weakness.     (all labs ordered are listed, but only abnormal results are displayed) Labs Reviewed  BASIC METABOLIC PANEL WITH GFR - Abnormal; Notable for the following components:      Result Value   Sodium 122 (*)    Chloride 88 (*)    BUN 7 (*)    All other components within normal limits  CBC - Abnormal; Notable for the following components:   RBC 4.05 (*)    MCV 106.4 (*)    MCH 37.0 (*)    All other components within normal limits  BRAIN NATRIURETIC PEPTIDE  TROPONIN I (HIGH SENSITIVITY)  TROPONIN I (HIGH SENSITIVITY)    EKG: None  Radiology: DG Chest 2 View Result Date: 12/09/2023 CLINICAL DATA:  chest pain EXAM: CHEST - 2 VIEW COMPARISON:  Oct 31, 2023 FINDINGS: Elevation of the right hemidiaphragm with subsegmental right basilar atelectasis, unchanged. No focal airspace consolidation, pleural effusion, or pneumothorax. No cardiomegaly. No acute fracture or destructive lesions. Multilevel thoracic osteophytosis. Osteopenia. IMPRESSION: No acute cardiopulmonary abnormality. Electronically Signed   By: Rogelia Myers M.D.   On: 12/09/2023 11:19     Procedures   Medications Ordered in the ED  sodium chloride  0.9 % bolus 500 mL (has no administration in time range)                                    Medical Decision Making Patient here for chest pain exertional dyspnea and bilateral lower leg swelling.  Describes swelling is intermittent worse after ambulation.  Some exertional shortness of breath as well.  History of asthma denies any cough fever or chills.  Also having some generalized weakness  On exam, patient well-appearing, 2+ pitting edema bilateral lower extremities.   No excessive warmth or erythema.  ACS, CHF, PE, pneumonia, asthma exacerbation all considered     Amount and/or Complexity of Data Reviewed Labs: ordered.    Details: Labs hyponatremic with sodium of 122.  Delta troponin unchanged.  BNP reassuring at 63. Radiology: ordered.    Details: Chest x-ray without acute cardiopulmonary findings ECG/medicine tests: ordered.    Details: EKG shows normal sinus rhythm Discussion of management or test interpretation with external provider(s): Patient with hyponatremia, given IV fluids here.  Has some expiratory wheezes on arrival was given albuterol  neb with some improvement.  No hypoxia here.  Discussed with  [facility] hospitalist, Dr. Pearlean who agrees to admit  Risk Prescription drug management. Decision regarding hospitalization.        Final diagnoses:  Hyponatremia    ED Discharge Orders     None          Herlinda Milling, PA-C 12/09/23 1753    Suzette Pac, MD 12/12/23 [phone]

## 2023-12-09 NOTE — Assessment & Plan Note (Addendum)
 Worsening dyspnea cough, with rhonchi on exam.  Chest x-ray clear.  Ongoing tobacco abuse. - DuoNebs as needed and scheduled - 125 mg Solu-Medrol , continue 60 twice daily - Mucolytics - Azithromycin 

## 2023-12-09 NOTE — Assessment & Plan Note (Addendum)
 Na- 122.  History of hyponatremia as low as 116 a year ago.  Last check 10 months ago sodium improved to 129 after hospitalization, thought secondary to alcohol abuse.  Chronic trace edema to bilateral lower extremities unchanged. -1 L bolus given, continue N/s75cc/hr - Serum osmolality, urine osmolality -Urine sodium - TSH

## 2023-12-09 NOTE — Assessment & Plan Note (Signed)
 Smokes half a pack of cigarettes daily. - Nicotine  Patch

## 2023-12-09 NOTE — H&P (Addendum)
 History and Physical    DANNER PAULDING FMW:[id number] DOB: September 29, 1960 DOA: 12/09/2023  PCP: Carlette Benita Area, MD   Patient coming from: Home  I have personally briefly reviewed patient's old medical records in [facility]  Chief Complaint: Chest Pain  HPI: Jesus Walker is a 63 y.o. male with medical history significant for COPD, seizure, hypertension, alcohol dependence. Patient presented to the ED with complaints of difficulty breathing generalized weakness and left-sided chest pain that started about 4 to 5 days ago.  Reports has had similar chest pains in the past.  Chest pain is left-sided, described as sharp, radiating down his left arm, and associated with difficulty breathing.  He reports both symptoms are worse with exertion and improved with using his breathing treatments and home O2.  He is not sure if just resting helps.  Also reports worsening cough. He reports chronic stable oral intake, no vomiting or diarrhea.  He denies excessive water intake.  He drinks at least 1-2 beers daily.  His drink was last night. Reports chronic intermittent bilateral lower extremity swelling involving mostly his feet.  ED Course: Temperature 97.5.  Heart rate 77-88.  Respirate rate 14-20.  Blood pressure systolic 132-183.  O2 sats 92 to 100% on room air. Sodium 122.  Troponin 4 >> 5. BNP 63.  Two-view chest x-ray negative for acute abnormality.  EKG without acute changes. 1 L bolus given.  \Review of Systems: As per HPI all other systems reviewed and negative.  Past Medical History:  Diagnosis Date   Alcohol dependence (HCC)    Asthma    COPD (chronic obstructive pulmonary disease) (HCC)    GERD (gastroesophageal reflux disease)    Hypertension    Substance abuse (HCC)     Past Surgical History:  Procedure Laterality Date   BRONCHIAL BRUSHINGS  01/03/2016   Procedure: BRONCHIAL BRUSHINGS;  Surgeon: Dallas Gelineau, MD;  [facility];  Service: Cardiopulmonary;;    BRONCHIAL WASHINGS  01/03/2016   Procedure: BRONCHIAL WASHINGS;  Surgeon: Dallas Gelineau, MD;  [facility]: [facility];  Service: Cardiopulmonary;;   FLEXIBLE BRONCHOSCOPY N/A 01/03/2016   Procedure: FLEXIBLE BRONCHOSCOPY;  Surgeon: Dallas Gelineau, MD;  Location: [facility];  Service: Cardiopulmonary;  Laterality: N/A;     reports that he has been smoking cigarettes. He has never used smokeless tobacco. He reports current alcohol use of about 12.0 standard drinks of alcohol per week. He reports that he does not use drugs.  No Known Allergies  Family History  Problem Relation Age of Onset   Asthma Mother 31   Asthma Father 47   Prior to Admission medications   Medication Sig Start Date End Date Taking? Authorizing Provider  acetaminophen  (TYLENOL ) 500 MG tablet Take 500 mg by mouth 2 (two) times daily.   Yes [provider]  albuterol  (PROVENTIL ) (2.5 MG/3ML) 0.083% nebulizer solution Take 2.5 mg by nebulization 4 (four) times daily. 01/12/23  Yes [provider]  amLODipine  (NORVASC ) 10 MG tablet Take 10 mg by mouth daily. 05/31/21  Yes [provider]  atorvastatin  (LIPITOR) 10 MG tablet Take 10 mg by mouth daily. 03/04/22  Yes [provider]  budesonide -formoterol  (SYMBICORT ) 160-4.5 MCG/ACT inhaler Inhale 2 puffs into the lungs 2 (two) times daily. 06/14/18  Yes Ricky Fines, MD  folic acid  (FOLVITE ) 1 MG tablet Take 1 tablet (1 mg total) by mouth daily. 08/17/22  Yes Johnson, Clanford L, MD  ipratropium-albuterol  (DUONEB) 0.5-2.5 (3) MG/3ML SOLN Take 3 mLs  by nebulization every 4 (four) hours as needed (SOB/wheezing). 08/17/22  Yes Johnson, Clanford L, MD  metoprolol  tartrate (LOPRESSOR ) 25 MG tablet Take 25 mg by mouth 2 (two) times daily. 11/09/23  Yes [provider]  naproxen  (NAPROSYN ) 500 MG tablet Take 500 mg by mouth daily. 12/31/22  Yes [provider]  omeprazole (PRILOSEC) 20 MG capsule Take 20 mg by mouth daily.   Yes [provider]  PROAIR  HFA 108 (90 Base) MCG/ACT inhaler Inhale 2 puffs into the lungs 4 (four) times daily. 12/26/19  Yes [provider]    Physical Exam: Vitals:   12/09/23 1645 12/09/23 1800 12/09/23 1826 12/09/23 1828  BP:   (!) 145/85   Pulse: 81 89 87 86  Resp: 20 18 20 17   Temp:      TempSrc:      SpO2: 99% 98% 96% 97%  Weight:      Height:        Constitutional: Acutely ill-appearing Vitals:   12/09/23 1645 12/09/23 1800 12/09/23 1826 12/09/23 1828  BP:   (!) 145/85   Pulse: 81 89 87 86  Resp: 20 18 20 17   Temp:      TempSrc:      SpO2: 99% 98% 96% 97%  Weight:      Height:       Eyes: PERRL, lids and conjunctivae normal ENMT: Mucous membranes are moist.  Neck: normal, supple, no masses, no thyromegaly Respiratory: Diffuse expiratory rhonchi, moderate increased work of breathing, patient sitting up at almost 90 degrees angle. Cardiovascular: Regular rate and rhythm, no murmurs / rubs / gallops.   Mild puffiness to bilateral feet, with trace edema just above the ankles.   Abdomen: no tenderness, no masses palpated. No hepatosplenomegaly. Bowel sounds positive.  Musculoskeletal: no clubbing / cyanosis. No joint deformity upper and lower extremities.  Skin: no rashes, lesions, ulcers. No induration Neurologic: No facial asymmetry, speech fluent, moving all extremities spontaneously. Psychiatric: Normal judgment and insight. Alert and oriented x 3. Normal mood.   Labs on Admission: I have personally reviewed following labs and imaging studies  CBC: Recent Labs  Lab 12/09/23 1126  WBC 7.5  HGB 15.0  HCT 43.1  MCV 106.4*  PLT 202   Basic Metabolic Panel: Recent Labs  Lab 12/09/23 1126  NA 122*  K 4.3  CL 88*  CO2 22  GLUCOSE 81  BUN 7*  CREATININE 0.76  CALCIUM  9.3   Urine analysis:    Component Value Date/Time   COLORURINE STRAW (A) 01/15/2023 1618   APPEARANCEUR CLEAR 01/15/2023 1618   LABSPEC 1.005 01/15/2023 1618   PHURINE 7.0  01/15/2023 1618   GLUCOSEU NEGATIVE 01/15/2023 1618   HGBUR NEGATIVE 01/15/2023 1618   BILIRUBINUR NEGATIVE 01/15/2023 1618   KETONESUR NEGATIVE 01/15/2023 1618   PROTEINUR NEGATIVE 01/15/2023 1618   NITRITE NEGATIVE 01/15/2023 1618   LEUKOCYTESUR NEGATIVE 01/15/2023 1618    Radiological Exams on Admission: DG Chest 2 View Result Date: 12/09/2023 CLINICAL DATA:  chest pain EXAM: CHEST - 2 VIEW COMPARISON:  Oct 31, 2023 FINDINGS: Elevation of the right hemidiaphragm with subsegmental right basilar atelectasis, unchanged. No focal airspace consolidation, pleural effusion, or pneumothorax. No cardiomegaly. No acute fracture or destructive lesions. Multilevel thoracic osteophytosis. Osteopenia. IMPRESSION: No acute cardiopulmonary abnormality. Electronically Signed   By: Rogelia Myers M.D.   On: 12/09/2023 11:19   EKG: Independently reviewed.  Sinus rhythm, rate 75, QTc 415.  Assessment/Plan Principal Problem:   Hyponatremia Active Problems:  Alcohol dependence (HCC)   COPD exacerbation (HCC)   Tobacco dependence   HTN (hypertension)   Chest pain   Assessment and Plan: * Hyponatremia Na- 122.  History of hyponatremia as low as 116 a year ago.  Last check 10 months ago sodium improved to 129 after hospitalization, thought secondary to alcohol abuse.  Chronic trace edema to bilateral lower extremities unchanged. -1 L bolus given, continue N/s75cc/hr - Serum osmolality, urine osmolality -Urine sodium - TSH  Alcohol dependence (HCC) Drinks at least 2 beers daily.  Last drink was last night-about 24 hours ago. Per sister at bedside- history of delirium tremens.  No seizure history.  At this time no sign of withdrawal. -CIWA as needed -Thiamine  folate multivitamins -Potassium 4.3, check magnesium   COPD exacerbation (HCC) Worsening dyspnea cough, with rhonchi on exam.  Chest x-ray clear.  Ongoing tobacco abuse. - DuoNebs as needed and scheduled - 125 mg Solu-Medrol , continue 60  twice daily - Mucolytics - Azithromycin    Chest pain Left-sided chest pain, appears to be more related to his COPD exacerbation, but also has risk factors for coronary artery disease. EKG non-acute, troponin 4 > 5.  -Resume atorvastatin  - Aspirin  81 mg daily - Echo. -Further workup pending clinical course  HTN (hypertension) Stable. -Resume Norvasc , metoprolol   Tobacco dependence Smokes half a pack of cigarettes daily. -Nicotine  Patch   DVT prophylaxis: Lovenox  Code Status: FULL Family Communication: SISter- Mary at bedside Disposition Plan: ~ 2 days Consults called: None  Admission status: Obs tele I certify that at the point of admission it is my clinical judgment that the patient will require inpatient hospital care spanning beyond 2 midnights from the point of admission due to high intensity of service, high risk for further deterioration and high frequency of surveillance required.   Author: Tully FORBES Carwin, MD 12/09/2023 7:44 PM  For on call review [email or web address].

## 2023-12-09 NOTE — ED Triage Notes (Signed)
 Pt c/o mid sternal chest pain that radiates to left side of chest; pt states the pain started x 2 days ago and he feels sob and weak

## 2023-12-09 NOTE — Assessment & Plan Note (Signed)
Stable. -Resume Norvasc, metoprolol 

## 2023-12-10 ENCOUNTER — Other Ambulatory Visit (HOSPITAL_COMMUNITY): Payer: Self-pay | Admitting: *Deleted

## 2023-12-10 ENCOUNTER — Observation Stay (HOSPITAL_COMMUNITY)

## 2023-12-10 DIAGNOSIS — E871 Hypo-osmolality and hyponatremia: Secondary | ICD-10-CM | POA: Diagnosis not present

## 2023-12-10 DIAGNOSIS — R079 Chest pain, unspecified: Secondary | ICD-10-CM | POA: Diagnosis not present

## 2023-12-10 LAB — BASIC METABOLIC PANEL WITH GFR
Anion gap: 9 (ref 5–15)
BUN: 7 mg/dL — ABNORMAL LOW (ref 8–23)
CO2: 25 mmol/L (ref 22–32)
Calcium: 9 mg/dL (ref 8.9–10.3)
Chloride: 93 mmol/L — ABNORMAL LOW (ref 98–111)
Creatinine, Ser: 0.69 mg/dL (ref 0.61–1.24)
GFR, Estimated: >60 mL/min (ref >60)
Glucose, Bld: 119 mg/dL — ABNORMAL HIGH (ref 70–99)
Potassium: 4.3 mmol/L (ref 3.5–5.1)
Sodium: 127 mmol/L — ABNORMAL LOW (ref 135–145)

## 2023-12-10 LAB — CBC
HCT: 41.1 % (ref 39.0–52.0)
Hemoglobin: 14.8 g/dL (ref 13.0–17.0)
MCH: 36.5 pg — ABNORMAL HIGH (ref 26.0–34.0)
MCHC: 36 g/dL (ref 30.0–36.0)
MCV: 101.2 fL — ABNORMAL HIGH (ref 80.0–100.0)
Platelets: 234 K/uL (ref 150–400)
RBC: 4.06 MIL/uL — ABNORMAL LOW (ref 4.22–5.81)
RDW: 11.9 % (ref 11.5–15.5)
WBC: 4.4 K/uL (ref 4.0–10.5)
nRBC: 0 % (ref 0.0–0.2)

## 2023-12-10 LAB — HIV ANTIBODY (ROUTINE TESTING W REFLEX): HIV Screen 4th Generation wRfx: NONREACTIVE

## 2023-12-10 LAB — ECHOCARDIOGRAM COMPLETE
Area-P 1/2: 3.03 cm2
Height: 62 in
P 1/2 time: 532 ms
S' Lateral: 2.6 cm
Weight: 1979.2 [oz_av]

## 2023-12-10 LAB — OSMOLALITY: Osmolality: 274 mosm/kg — ABNORMAL LOW (ref 275–295)

## 2023-12-10 MED ORDER — GUAIFENESIN-DM 100-10 MG/5ML PO SYRP: 15.0000 mL | ORAL_SOLUTION | Freq: Three times a day (TID) | ORAL | 0 refills | Status: DC | PRN

## 2023-12-10 MED ORDER — PREDNISONE 20 MG PO TABS: 40.0000 mg | ORAL_TABLET | Freq: Every day | ORAL | 0 refills | Status: AC

## 2023-12-10 MED ORDER — AZITHROMYCIN 500 MG PO TABS: 500.0000 mg | ORAL_TABLET | Freq: Every day | ORAL | 0 refills | Status: AC

## 2023-12-10 MED ORDER — VITAMIN B-1 100 MG PO TABS: 100.0000 mg | ORAL_TABLET | Freq: Every day | ORAL | 0 refills | Status: AC

## 2023-12-10 NOTE — Progress Notes (Signed)
*  PRELIMINARY RESULTS* Echocardiogram 2D Echocardiogram has been performed.  [facility] Walker 12/10/2023, 9:45 AM

## 2023-12-10 NOTE — Care Management Obs Status (Signed)
 MEDICARE OBSERVATION STATUS NOTIFICATION   Patient Details  Name: Jesus Walker MRN: [id number] Date of Birth: 13-Jun-1960   Medicare Observation Status Notification Given:  Yes    Sharlyne Stabs, RN 12/10/2023, 12:30 PM

## 2023-12-10 NOTE — Plan of Care (Signed)
   Problem: Nutrition: Goal: Adequate nutrition will be maintained Outcome: Progressing

## 2023-12-10 NOTE — Discharge Summary (Signed)
 Physician Discharge Summary  Jesus Walker FMW:[id number] DOB: [birth year] DOA: 12/09/2023  PCP: Jesus Benita Area, MD  Admit date: 12/09/2023  Discharge date: 12/10/2023  Admitted From:Home  Disposition:  Home  Recommendations for Outpatient Follow-up:  Follow up with PCP in 1-2 weeks Continue on azithromycin  and prednisone  as prescribed to complete course of treatment Use home inhalers for shortness of breath or wheezing Counseled on alcohol cessation Continue other home medications as prior  Home Health: None  Equipment/Devices: Has home oxygen   Discharge Condition:Stable  CODE STATUS: Full  Diet recommendation: Heart Healthy  Brief/Interim Summary: Jesus Walker is a 63 y.o. male with medical history significant for COPD, seizure, hypertension, alcohol dependence. Patient presented to the ED with complaints of difficulty breathing generalized weakness and left-sided chest pain that started about 4 to 5 days ago.  Patient was admitted for evaluation of atypical chest pain is along with acute COPD exacerbation which appears to have been related.  His chest pain is already much improved and his respiratory quality has improved as well.  2D echocardiogram with preserved LVEF and no acute abnormalities noted.  His hyponatremia has improved with use of IV fluid and is back to near baseline which is in the high 120 low 130 range.  He is asymptomatic from this and has been counseled on alcohol cessation at home.  He is overall doing quite well and ambulates without any significant difficulties and is stable for discharge today.  Discharge Diagnoses:  Principal Problem:   Hyponatremia Active Problems:   Alcohol dependence (HCC)   COPD exacerbation (HCC)   Tobacco dependence   HTN (hypertension)   Chest pain  Principal discharge diagnosis: Atypical chest pain secondary to acute COPD exacerbation along with acute on chronic hyponatremia related to alcohol use.  Discharge  Instructions  Discharge Instructions     Diet - low sodium heart healthy   Complete by: As directed    Increase activity slowly   Complete by: As directed       Allergies as of 12/10/2023   No Known Allergies      Medication List     TAKE these medications    acetaminophen  500 MG tablet Commonly known as: TYLENOL  Take 500 mg by mouth 2 (two) times daily.   amLODipine  10 MG tablet Commonly known as: NORVASC  Take 10 mg by mouth daily.   atorvastatin  10 MG tablet Commonly known as: LIPITOR Take 10 mg by mouth daily.   azithromycin  500 MG tablet Commonly known as: ZITHROMAX  Take 1 tablet (500 mg total) by mouth daily for 3 days.   budesonide -formoterol  160-4.5 MCG/ACT inhaler Commonly known as: Symbicort  Inhale 2 puffs into the lungs 2 (two) times daily.   folic acid  1 MG tablet Commonly known as: FOLVITE  Take 1 tablet (1 mg total) by mouth daily.   guaiFENesin -dextromethorphan  100-10 MG/5ML syrup Commonly known as: ROBITUSSIN DM Take 15 mLs by mouth every 8 (eight) hours as needed for cough.   ipratropium-albuterol  0.5-2.5 (3) MG/3ML Soln Commonly known as: DUONEB Take 3 mLs by nebulization every 4 (four) hours as needed (SOB/wheezing).   metoprolol  tartrate 25 MG tablet Commonly known as: LOPRESSOR  Take 25 mg by mouth 2 (two) times daily.   naproxen  500 MG tablet Commonly known as: NAPROSYN  Take 500 mg by mouth daily.   omeprazole 20 MG capsule Commonly known as: PRILOSEC Take 20 mg by mouth daily.   predniSONE  20 MG tablet Commonly known as: DELTASONE  Take 2 tablets (40 mg total) by  mouth daily with breakfast for 5 days. Start taking on: December 11, 2023   ProAir  HFA 108 (90 Base) MCG/ACT inhaler Generic drug: albuterol  Inhale 2 puffs into the lungs 4 (four) times daily.   albuterol  (2.5 MG/3ML) 0.083% nebulizer solution Commonly known as: PROVENTIL  Take 2.5 mg by nebulization 4 (four) times daily.   thiamine  100 MG tablet Commonly known as:  Vitamin B-1 Take 1 tablet (100 mg total) by mouth daily. Start taking on: December 11, 2023        Follow-up Information     Fanta, Tesfaye Demissie, MD. Schedule an appointment as soon as possible for a visit in 1 week(s).   Specialty: Internal Medicine Contact information: [address] [place] [address] [phone]                No Known Allergies  Consultations: None   Procedures/Studies: ECHOCARDIOGRAM COMPLETE Result Date: 12/10/2023    ECHOCARDIOGRAM REPORT   Patient Name:   Jesus Walker Date of Exam: 12/10/2023 Medical Rec #:  [id number]      Height:       62.0 in Accession #:    [id number]     Weight:       123.7 lb Date of Birth:  [birth year]      BSA:          1.559 m Patient Age:    63 years       BP:           129/73 mmHg Patient Gender: M              HR:           88 bpm. Exam Location:  [facility] Procedure: 2D Echo, Cardiac Doppler and Color Doppler (Both Spectral and Color            Flow Doppler were utilized during procedure). Indications:    Chest Pain R07.9  History:        Patient has no prior history of Echocardiogram examinations.                 COPD; Risk Factors:Current Smoker and Hypertension.  Sonographer:    [facility] Walker RCS Referring Phys: [id number] [facility]  Sonographer Comments: Image acquisition challenging due to respiratory motion. IMPRESSIONS  1. Left ventricular ejection fraction, by estimation, is 70 to 75%. The left ventricle has hyperdynamic function. The left ventricle has no regional wall motion abnormalities. Left ventricular diastolic parameters are consistent with Grade I diastolic dysfunction (impaired relaxation).  2. Right ventricular systolic function is hyperdynamic. The right ventricular size is normal. Tricuspid regurgitation signal is inadequate for assessing PA pressure.  3. The mitral valve is normal in structure. No evidence of mitral valve regurgitation. No evidence of mitral stenosis.  4. The aortic valve  is tricuspid. Aortic valve regurgitation is moderate. No aortic stenosis is present. Aortic regurgitation PHT measures 532 msec.  5. The inferior vena cava is normal in size with greater than 50% respiratory variability, suggesting right atrial pressure of 3 mmHg. Comparison(s): No prior Echocardiogram. FINDINGS  Left Ventricle: Left ventricular ejection fraction, by estimation, is 70 to 75%. The left ventricle has hyperdynamic function. The left ventricle has no regional wall motion abnormalities. Strain was performed and the global longitudinal strain is indeterminate. The left ventricular internal cavity size was normal in size. There is no left ventricular hypertrophy. Left ventricular diastolic parameters are consistent with Grade I diastolic dysfunction (impaired relaxation). Normal left ventricular  filling pressure. Right Ventricle: The right ventricular size is normal. No increase in right ventricular wall thickness. Right ventricular systolic function is hyperdynamic. Tricuspid regurgitation signal is inadequate for assessing PA pressure. Left Atrium: Left atrial size was normal in size. Right Atrium: Right atrial size was normal in size. Pericardium: There is no evidence of pericardial effusion. Mitral Valve: The mitral valve is normal in structure. No evidence of mitral valve regurgitation. No evidence of mitral valve stenosis. Tricuspid Valve: The tricuspid valve is not well visualized. Tricuspid valve regurgitation is not demonstrated. No evidence of tricuspid stenosis. Aortic Valve: The aortic valve is tricuspid. Aortic valve regurgitation is moderate. Aortic regurgitation PHT measures 532 msec. No aortic stenosis is present. Pulmonic Valve: The pulmonic valve was not well visualized. Pulmonic valve regurgitation is not visualized. No evidence of pulmonic stenosis. Aorta: The aortic root is normal in size and structure. Venous: The inferior vena cava is normal in size with greater than 50% respiratory  variability, suggesting right atrial pressure of 3 mmHg. IAS/Shunts: No atrial level shunt detected by color flow Doppler. Additional Comments: 3D was performed not requiring image post processing on an independent workstation and was indeterminate.  LEFT VENTRICLE PLAX 2D LVIDd:         4.10 cm   Diastology LVIDs:         2.60 cm   LV e' medial:    7.94 cm/s LV PW:         0.90 cm   LV E/e' medial:  6.5 LV IVS:        0.90 cm   LV e' lateral:   9.25 cm/s LVOT diam:     1.90 cm   LV E/e' lateral: 5.6 LV SV:         58 LV SV Index:   37 LVOT Walker:     2.84 cm  RIGHT VENTRICLE RV S prime:     20.40 cm/s TAPSE (M-mode): 2.3 cm LEFT ATRIUM           Index        RIGHT ATRIUM           Index LA diam:      3.70 cm 2.37 cm/m   RA Walker:     13.80 cm LA Vol (A4C): 34.4 ml 22.07 ml/m  RA Volume:   31.10 ml  19.95 ml/m  AORTIC VALVE LVOT Vmax:   122.00 cm/s LVOT Vmean:  73.600 cm/s LVOT VTI:    0.206 m AI PHT:      532 msec  AORTA Ao Root diam: 3.40 cm MITRAL VALVE MV Walker (PHT): 3.03 cm    SHUNTS MV Decel Time: 250 msec    Systemic VTI:  0.21 m MV E velocity: 51.80 cm/s  Systemic Diam: 1.90 cm MV A velocity: 89.00 cm/s MV E/A ratio:  0.58 Vishnu Priya Mallipeddi Electronically signed by Diannah Late Mallipeddi Signature Date/Time: 12/10/2023/11:53:50 AM    Final    DG Chest 2 View Result Date: 12/09/2023 CLINICAL DATA:  chest pain EXAM: CHEST - 2 VIEW COMPARISON:  Oct 31, 2023 FINDINGS: Elevation of the right hemidiaphragm with subsegmental right basilar atelectasis, unchanged. No focal airspace consolidation, pleural effusion, or pneumothorax. No cardiomegaly. No acute fracture or destructive lesions. Multilevel thoracic osteophytosis. Osteopenia. IMPRESSION: No acute cardiopulmonary abnormality. Electronically Signed   By: Rogelia Myers M.D.   On: 12/09/2023 11:19     Discharge Exam: Vitals:   12/10/23 0751 12/10/23 0933  BP:  123/67  Pulse:  96  Resp:  20  Temp:  98.5 F (36.9 C)  SpO2: 97% 96%    Vitals:   12/10/23 0502 12/10/23 0514 12/10/23 0751 12/10/23 0933  BP:  129/73  123/67  Pulse:  88  96  Resp:  19  20  Temp:  98 F (36.7 C)  98.5 F (36.9 C)  TempSrc:  Oral  Oral  SpO2: 97% 100% 97% 96%  Weight:      Height:        General: Pt is alert, awake, not in acute distress Cardiovascular: RRR, S1/S2 +, no rubs, no gallops Respiratory: CTA bilaterally, no wheezing, no rhonchi, nasal cannula oxygen  Abdominal: Soft, NT, ND, bowel sounds + Extremities: no edema, no cyanosis    The results of significant diagnostics from this hospitalization (including imaging, microbiology, ancillary and laboratory) are listed below for reference.     Microbiology: No results found for this or any previous visit (from the past 240 hours).   Labs: BNP (last 3 results) Recent Labs    12/09/23 1531  BNP 63.0   Basic Metabolic Panel: Recent Labs  Lab 12/09/23 1126 12/09/23 1531 12/10/23 0430  NA 122*  --  127*  K 4.3  --  4.3  CL 88*  --  93*  CO2 22  --  25  GLUCOSE 81  --  119*  BUN 7*  --  7*  CREATININE 0.76  --  0.69  CALCIUM  9.3  --  9.0  MG  --  1.8  --    Liver Function Tests: No results for input(s): AST, ALT, ALKPHOS, BILITOT, PROT, ALBUMIN in the last 168 hours. No results for input(s): LIPASE, AMYLASE in the last 168 hours. No results for input(s): AMMONIA in the last 168 hours. CBC: Recent Labs  Lab 12/09/23 1126 12/10/23 0430  WBC 7.5 4.4  HGB 15.0 14.8  HCT 43.1 41.1  MCV 106.4* 101.2*  PLT 202 234   Cardiac Enzymes: No results for input(s): CKTOTAL, CKMB, CKMBINDEX, TROPONINI in the last 168 hours. BNP: Invalid input(s): POCBNP CBG: No results for input(s): GLUCAP in the last 168 hours. D-Dimer No results for input(s): DDIMER in the last 72 hours. Hgb A1c No results for input(s): HGBA1C in the last 72 hours. Lipid Profile No results for input(s): CHOL, HDL, LDLCALC, TRIG, CHOLHDL,  LDLDIRECT in the last 72 hours. Thyroid function studies Recent Labs    12/09/23 1531  TSH 2.706   Anemia work up No results for input(s): VITAMINB12, FOLATE, FERRITIN, TIBC, IRON, RETICCTPCT in the last 72 hours. Urinalysis    Component Value Date/Time   COLORURINE STRAW (A) 01/15/2023 1618   APPEARANCEUR CLEAR 01/15/2023 1618   LABSPEC 1.005 01/15/2023 1618   PHURINE 7.0 01/15/2023 1618   GLUCOSEU NEGATIVE 01/15/2023 1618   HGBUR NEGATIVE 01/15/2023 1618   BILIRUBINUR NEGATIVE 01/15/2023 1618   KETONESUR NEGATIVE 01/15/2023 1618   PROTEINUR NEGATIVE 01/15/2023 1618   NITRITE NEGATIVE 01/15/2023 1618   LEUKOCYTESUR NEGATIVE 01/15/2023 1618   Sepsis Labs Recent Labs  Lab 12/09/23 1126 12/10/23 0430  WBC 7.5 4.4   Microbiology No results found for this or any previous visit (from the past 240 hours).   Time coordinating discharge: 35 minutes  SIGNED:   Adron JONETTA Fairly, DO [facility] 12/10/2023, 12:58 PM  If 7PM-7AM, please contact night-coverage [email or web address]

## 2023-12-10 NOTE — TOC Progression Note (Signed)
 Transition of Care [facility]) - Inpatient Brief Assessment   Patient Details  Name: Jesus Walker MRN: [id number] Date of Birth: 1960-08-22  Transition of [facility]) CM/SW Contact:    Sharlyne Stabs, RN Phone Number: 12/10/2023, 11:42 AM   Clinical Narrative: Discharge planning for patient to return home. Substance abuse resources added to AVS. TOC has complete multiple SA consults with resources. Patient is on home oxygen . Live home with spouse. No new needs identified.    Transition of Care Asessment: Insurance and Status: Insurance coverage has been reviewed Patient has primary care physician: Yes Home environment has been reviewed: Home Prior level of function:: independent Prior/Current Home Services: No current home services   Readmission risk has been reviewed: Yes Transition of care needs: no transition of care needs at this time T       Social Determinants of Health (SDOH) Interventions SDOH Screenings   Food Insecurity: No Food Insecurity (12/09/2023)  Housing: Unknown (12/09/2023)  Transportation Needs: No Transportation Needs (12/09/2023)  Utilities: Not At Risk (12/09/2023)  Social Connections: Moderately Isolated (12/09/2023)  Tobacco Use: High Risk (12/09/2023)

## 2024-01-14 NOTE — Progress Notes (Signed)
 Jesus Walker, male    DOB: 1960/10/19    MRN: [id number]   Brief patient profile:  40  yobf  active smoker asthma since childhood  referred to pulmonary clinic in [place]  01/15/2024 by Carlette  for abn cxr with RHD elevation x 2017     Pt not previously seen by PCCM service.    Admit date: 12/09/2023 Discharge date: 12/10/2023 Recommendations for Outpatient Follow-up:  Follow up with PCP in 1-2 weeks Continue on azithromycin  and prednisone  as prescribed to complete course of treatment Use home inhalers for shortness of breath or wheezing Counseled on alcohol cessation Continue other home medications as prior   Home Health: None Equipment/Devices: Has home oxygen   Brief/Interim Summary: Jesus Walker is a 63 y.o. male with medical history significant for COPD, seizure, hypertension, alcohol dependence. Patient presented to the ED with complaints of difficulty breathing generalized weakness and left-sided chest pain that started about 4 to 5 days ago.  Patient was admitted for evaluation of atypical chest pain is along with acute COPD exacerbation which appears to have been related.  His chest pain is already much improved and his respiratory quality has improved as well.  2D echocardiogram with preserved LVEF and no acute abnormalities noted.  His hyponatremia has improved with use of IV fluid and is back to near baseline which is in the high 120 low 130 range.  He is asymptomatic from this and has been counseled on alcohol cessation at home.  He is overall doing quite well and ambulates without any significant difficulties and is stable for discharge today.   Discharge Diagnoses:  Principal Problem:   Hyponatremia   Alcohol dependence (HCC)   COPD exacerbation (HCC)   Tobacco dependence   HTN (hypertension)   Chest pain    History of Present Illness  01/15/2024  Pulmonary/ 1st Walker eval/ Jesus Walker / Jesus Walker still smoking Chief Complaint  Patient presents with    Establish Care    Abn ct - shob   Dyspnea:  dropped off door maybe 2 aisles  Cough: white/ clear worse in am up to  Sleep: bed is flat  with 2 thick pillows  SABA use: seve times a day hfa - up to bid neb  02: concentrator x 2 lpm prn     No obvious day to day or daytime pattern/variability or assoc excess/ purulent sputum or mucus plugs or hemoptysis or cp or chest tightness, subjective wheeze or overt  hb symptoms.    Also denies any obvious fluctuation of symptoms with weather or environmental changes or other aggravating or alleviating factors except as outlined above   No unusual exposure hx or h/o childhood pna/ asthma or knowledge of premature birth.  Current Allergies, Complete Past Medical History, Past Surgical History, Family History, and Social History were reviewed in [employer] record.  ROS  The following are not active complaints unless bolded Hoarseness, sore throat, dysphagia, dental problems, itching, sneezing,  nasal congestion or discharge of excess mucus or purulent secretions, ear ache,   fever, chills, sweats, unintended wt loss or wt gain, classically pleuritic or exertional cp,  orthopnea pnd or arm/hand swelling  or leg swelling, presyncope, palpitations, abdominal pain, anorexia, nausea, vomiting, diarrhea  or change in bowel habits or change in bladder habits, change in stools or change in urine, dysuria, hematuria,  rash, arthralgias, visual complaints, headache, numbness, weakness or ataxia or problems with walking or coordination,  change in mood or  memory.            Outpatient Medications Prior to Visit  Medication Sig Dispense Refill   acetaminophen  (TYLENOL ) 500 MG tablet Take 500 mg by mouth 2 (two) times daily.     albuterol  (PROVENTIL ) (2.5 MG/3ML) 0.083% nebulizer solution Take 2.5 mg by nebulization 4 (four) times daily.     amLODipine  (NORVASC ) 10 MG tablet Take 10 mg by mouth daily.     atorvastatin  (LIPITOR) 10 MG tablet  Take 10 mg by mouth daily.     budesonide -formoterol  (SYMBICORT ) 160-4.5 MCG/ACT inhaler Inhale 2 puffs into the lungs 2 (two) times daily. 1 Inhaler 3   folic acid  (FOLVITE ) 1 MG tablet Take 1 tablet (1 mg total) by mouth daily. 30 tablet 2   ipratropium-albuterol  (DUONEB) 0.5-2.5 (3) MG/3ML SOLN Take 3 mLs by nebulization every 4 (four) hours as needed (SOB/wheezing). 360 mL 2   metoprolol  tartrate (LOPRESSOR ) 25 MG tablet Take 25 mg by mouth 2 (two) times daily.     naproxen  (NAPROSYN ) 500 MG tablet Take 500 mg by mouth daily.     omeprazole (PRILOSEC) 20 MG capsule Take 20 mg by mouth daily.     thiamine  (VITAMIN B-1) 100 MG tablet Take 1 tablet (100 mg total) by mouth daily. 30 tablet 0   PROAIR  HFA 108 (90 Base) MCG/ACT inhaler Inhale 2 puffs into the lungs 4 (four) times daily.     guaiFENesin -dextromethorphan  (ROBITUSSIN DM) 100-10 MG/5ML syrup Take 15 mLs by mouth every 8 (eight) hours as needed for cough. 118 mL 0   No facility-administered medications prior to visit.    Past Medical History:  Diagnosis Date   Alcohol dependence (HCC)    Asthma    COPD (chronic obstructive pulmonary disease) (HCC)    GERD (gastroesophageal reflux disease)    Hypertension    Substance abuse (HCC)       Objective:     BP (!) 147/68   Pulse 70   Ht 5' 2 (1.575 m)   Wt 131 lb (59.4 kg)   SpO2 98% Comment: ra  BMI 23.96 kg/m   SpO2: 98 % (ra) pleasant amb bm nad   HEENT : Oropharynx  clear with poor dentition   Nasal turbinates nl    NECK :  without  apparent JVD/ palpable Nodes/TM    LUNGS: no acc muscle use,  Min barrel  contour chest wall with bilateral  slightly decreased bs s audible wheeze and  without cough on insp or exp maneuvers and min  Hyperresonant  to  percussion bilaterally    CV:  RRR  no s3 or murmur or increase in P2, and no edema   ABD:  soft and nontender with  negative  Hoover's  in the supine position.  No bruits or organomegaly appreciated   MS:  Nl  gait/ ext warm with mild L hand  deformity  Or obvious joint restrictions  calf tenderness, cyanosis or clubbing     SKIN: warm and dry without lesions    NEURO:  alert, approp, nl sensorium with  no motor or cerebellar deficits apparent.       I personally reviewed images and agree with radiology impression as follows:  CXR: pa and lateral 12/09/23  Elevation of the right hemidiaphragm with subsegmental right basilar atelectasis, unchanged.      Assessment   Assessment & Plan Need for vaccination against Streptococcus pneumoniae - given today   COPD GOLD 0/AB - Spirometry 07/07/2018  FEV1  1.36 (60%)  Ratio 0.78 with straight exp f/v - LDSCT 10/31/23  Mild-to-moderate paraseptal and centrilobular emphysema - 01/15/2024   Walked on RA  x  3  lap(s) =  approx 450  ft  @ slow pace, stopped due to end of study with lowest 02 sats 93% - PFTs odered 01/15/2024  - 01/15/2024  After extensive coaching inhaler device,  effectiveness =   50% (short ti) with hfa > continue symbicort  160 with more approp saba than he's been using x years Re SABA :  I spent extra time with pt today reviewing appropriate use of albuterol  for prn use on exertion with the following points: 1) saba is for relief of sob that does not improve by walking a slower pace or resting but rather if the pt does not improve after trying this first. 2) If the pt is convinced, as many are, that saba helps recover from activity faster then it's easy to tell if this is the case by re-challenging : ie stop, take the inhaler, then p 5 minutes try the exact same activity (intensity of workload) that just caused the symptoms and see if they are substantially diminished or not after saba 3) if there is an activity that reproducibly causes the symptoms, try the saba 15 min before the activity on alternate days   If in fact the saba really does help, then fine to continue to use it prn but advised may need to look closer at the maintenance regimen  (symbicort  160 for  now) being used to achieve better control of airways disease with exertion.     Pulmonary nodule - Active longtime smoker - 9.8 LLL new from 10/22/22 LDSCT CT results reviewed with pt >>> Too small for PET or bx, not suspicious enough for excisional bx > really only option for now is follow the Fleischner society guidelines as rec by radiology= repeat study in 3 months - mid septemer w= Poor dentition Rec dental eval asap 01/15/2024  Cigarette smoker Counseled re importance of smoking cessation but did not meet time criteria for separate billing   Return in 6 weeks with all meds in hand using a trust but verify approach to confirm accurate Medication  Reconciliation The principal here is that until we are certain that the  patients are doing what we've asked, it makes no sense to ask them to do more.   >>>> pfts on return   Each maintenance medication was reviewed in detail including emphasizing most importantly the difference between maintenance and prns and under what circumstances the prns are to be triggered using an action plan format where appropriate.  Total time for H and P, chart review, counseling, reviewing hfa/nebs/02 device(s) , directly observing portions of ambulatory 02 saturation study/ and generating customized AVS unique to this Walker visit / same day charting = 64 min new pt eval                    Patient Instructions  Plan A = Automatic = Always=    Symbicort  160 Take 2 puffs first thing in am and then another 2 puffs about 12 hours later.    Work on inhaler technique:  relax and gently blow all the way out then take a nice smooth full deep breath back in, triggering the inhaler at same time you start breathing in.  Hold breath in for at least  5 seconds if you can. Blow out symbicort   thru nose. Rinse and gargle  with water when done.  If mouth or throat bother you at all,  try brushing teeth/gums/tongue with arm and hammer toothpaste/ make a slurry  and gargle and spit out.      Plan B = Backup (to supplement plan A, not to replace it) Use your albuterol  inhaler as a rescue medication to be used if you can't catch your breath by resting or slowing your pace  or doing a relaxed purse lip breathing pattern.  - The less you use it, the better it will work when you need it. - Ok to use the inhaler up to 2 puffs  every 4 hours if you must but call for appointment if use goes up over your usual need - Don't leave home without it !!  (think of it like the spare tire or starter fluid for your car)   Plan C = Crisis (instead of Plan B but only if Plan B stops working) - only use your albuterol  nebulizer if you first try Plan B and it fails to help > ok to use the nebulizer up to every 4 hours but if start needing it regularly call for immediate appointment  My Walker will be contacting you by phone for referral to CT chest in one month   - if you don't hear back from my Walker within one week please call us  back or notify us  thru MyChart and we'll address it right away.     The key is to stop smoking completely before smoking completely stops you!    See a dentist as soon as you can   Please schedule a follow up Walker visit in 6 weeks, sooner if needed  with all medications /inhalers/ solutions in hand so we can verify exactly what you are taking. This includes all medications from all doctors and over the counters   PFTs on return    Ozell America, MD 01/15/2024

## 2024-01-15 ENCOUNTER — Encounter: Payer: Self-pay | Admitting: Internal Medicine

## 2024-01-15 ENCOUNTER — Ambulatory Visit (INDEPENDENT_AMBULATORY_CARE_PROVIDER_SITE_OTHER): Admitting: Internal Medicine

## 2024-01-15 ENCOUNTER — Telehealth: Payer: Self-pay | Admitting: Internal Medicine

## 2024-01-15 VITALS — BP 147/68 | HR 70 | Ht 62.0 in | Wt 131.0 lb

## 2024-01-15 DIAGNOSIS — K089 Disorder of teeth and supporting structures, unspecified: Secondary | ICD-10-CM | POA: Insufficient documentation

## 2024-01-15 DIAGNOSIS — R911 Solitary pulmonary nodule: Secondary | ICD-10-CM | POA: Insufficient documentation

## 2024-01-15 DIAGNOSIS — F17210 Nicotine dependence, cigarettes, uncomplicated: Secondary | ICD-10-CM | POA: Insufficient documentation

## 2024-01-15 DIAGNOSIS — J449 Chronic obstructive pulmonary disease, unspecified: Secondary | ICD-10-CM | POA: Diagnosis not present

## 2024-01-15 DIAGNOSIS — Z23 Encounter for immunization: Secondary | ICD-10-CM | POA: Diagnosis not present

## 2024-01-15 MED ORDER — ALBUTEROL SULFATE HFA 108 (90 BASE) MCG/ACT IN AERS: INHALATION_SPRAY | RESPIRATORY_TRACT | 11 refills | Status: AC

## 2024-01-15 NOTE — Assessment & Plan Note (Addendum)
-   Spirometry 07/07/2018  FEV1 1.36 (60%)  Ratio 0.78 with straight exp f/v - LDSCT 10/31/23  Mild-to-moderate paraseptal and centrilobular emphysema - 01/15/2024   Walked on RA  x  3  lap(s) =  approx 450  ft  @ slow pace, stopped due to end of study with lowest 02 sats 93% - PFTs odered 01/15/2024  - 01/15/2024  After extensive coaching inhaler device,  effectiveness =   50% (short ti) with hfa > continue symbicort  160 with more approp saba than he's been using x years Re SABA :  I spent extra time with pt today reviewing appropriate use of albuterol  for prn use on exertion with the following points: 1) saba is for relief of sob that does not improve by walking a slower pace or resting but rather if the pt does not improve after trying this first. 2) If the pt is convinced, as many are, that saba helps recover from activity faster then it's easy to tell if this is the case by re-challenging : ie stop, take the inhaler, then p 5 minutes try the exact same activity (intensity of workload) that just caused the symptoms and see if they are substantially diminished or not after saba 3) if there is an activity that reproducibly causes the symptoms, try the saba 15 min before the activity on alternate days   If in fact the saba really does help, then fine to continue to use it prn but advised may need to look closer at the maintenance regimen (symbicort  160 for  now) being used to achieve better control of airways disease with exertion.

## 2024-01-15 NOTE — Patient Instructions (Addendum)
 Plan A = Automatic = Always=    Symbicort  160 Take 2 puffs first thing in am and then another 2 puffs about 12 hours later.    Work on inhaler technique:  relax and gently blow all the way out then take a nice smooth full deep breath back in, triggering the inhaler at same time you start breathing in.  Hold breath in for at least  5 seconds if you can. Blow out symbicort   thru nose. Rinse and gargle with water when done.  If mouth or throat bother you at all,  try brushing teeth/gums/tongue with arm and hammer toothpaste/ make a slurry and gargle and spit out.      Plan B = Backup (to supplement plan A, not to replace it) Use your albuterol  inhaler as a rescue medication to be used if you can't catch your breath by resting or slowing your pace  or doing a relaxed purse lip breathing pattern.  - The less you use it, the better it will work when you need it. - Ok to use the inhaler up to 2 puffs  every 4 hours if you must but call for appointment if use goes up over your usual need - Don't leave home without it !!  (think of it like the spare tire or starter fluid for your car)   Plan C = Crisis (instead of Plan B but only if Plan B stops working) - only use your albuterol  nebulizer if you first try Plan B and it fails to help > ok to use the nebulizer up to every 4 hours but if start needing it regularly call for immediate appointment  My office will be contacting you by phone for referral to CT chest in one month   - if you don't hear back from my office within one week please call us  back or notify us  thru MyChart and we'll address it right away.     The key is to stop smoking completely before smoking completely stops you!    See a dentist as soon as you can   Please schedule a follow up office visit in 6 weeks, sooner if needed  with all medications /inhalers/ solutions in hand so we can verify exactly what you are taking. This includes all medications from all doctors and over the counters    PFTs on return

## 2024-01-15 NOTE — Assessment & Plan Note (Signed)
-   Active longtime smoker - 9.8 LLL new from 10/22/22 LDSCT CT results reviewed with pt >>> Too small for PET or bx, not suspicious enough for excisional bx > really only option for now is follow the Fleischner society guidelines as rec by radiology= repeat study in 3 months - mid septemer w=

## 2024-01-15 NOTE — Assessment & Plan Note (Addendum)
 Counseled re importance of smoking cessation but did not meet time criteria for separate billing   Return in 6 weeks with all meds in hand using a trust but verify approach to confirm accurate Medication  Reconciliation The principal here is that until we are certain that the  patients are doing what we've asked, it makes no sense to ask them to do more.   >>>> pfts on return   Each maintenance medication was reviewed in detail including emphasizing most importantly the difference between maintenance and prns and under what circumstances the prns are to be triggered using an action plan format where appropriate.  Total time for H and P, chart review, counseling, reviewing hfa/nebs/02 device(s) , directly observing portions of ambulatory 02 saturation study/ and generating customized AVS unique to this office visit / same day charting = 64 min new pt eval

## 2024-01-15 NOTE — Telephone Encounter (Signed)
 Spoke with patient regarding the Thursday 03/10/24 10:00 am PFT at [facility] arrival time is 9:45 am 1st floor registration desk for check in--follow up with Dr. Darlean is 03/10/24 at 11:30am.  Will mail information to patient and he voiced his understanding

## 2024-01-15 NOTE — Assessment & Plan Note (Addendum)
 Rec dental eval asap 01/15/2024

## 2024-01-21 ENCOUNTER — Telehealth: Payer: Self-pay | Admitting: Internal Medicine

## 2024-01-21 NOTE — Telephone Encounter (Signed)
 Spoke with patient regarding the 02/06/24 3:00pm Chest CT appointment at [facility] time is 2:45 pm---1st floor registration desk---will mail information to patient and he voiced his understanding

## 2024-02-06 ENCOUNTER — Ambulatory Visit (HOSPITAL_COMMUNITY)
Admission: RE | Admit: 2024-02-06 | Discharge: 2024-02-06 | Disposition: A | Discharge status: Home / Self Care | Source: Ambulatory Visit | Attending: Internal Medicine | Admitting: Internal Medicine

## 2024-02-06 DIAGNOSIS — R911 Solitary pulmonary nodule: Secondary | ICD-10-CM | POA: Insufficient documentation

## 2024-02-20 ENCOUNTER — Ambulatory Visit: Payer: Self-pay | Admitting: Internal Medicine

## 2024-02-20 DIAGNOSIS — F17210 Nicotine dependence, cigarettes, uncomplicated: Secondary | ICD-10-CM

## 2024-02-20 DIAGNOSIS — R911 Solitary pulmonary nodule: Secondary | ICD-10-CM

## 2024-02-26 NOTE — Progress Notes (Signed)
 Called the pt and there was no answer- LMTCB

## 2024-02-29 NOTE — Progress Notes (Signed)
 Called and relayed results to pt - he confirmed understanding - refer back to lcs

## 2024-03-10 ENCOUNTER — Ambulatory Visit (HOSPITAL_COMMUNITY)
Admission: RE | Admit: 2024-03-10 | Discharge: 2024-03-10 | Disposition: A | Discharge status: Home / Self Care | Source: Ambulatory Visit | Attending: Internal Medicine | Admitting: Internal Medicine

## 2024-03-10 ENCOUNTER — Encounter: Payer: Self-pay | Admitting: Internal Medicine

## 2024-03-10 ENCOUNTER — Ambulatory Visit: Admitting: Internal Medicine

## 2024-03-10 ENCOUNTER — Ambulatory Visit (HOSPITAL_COMMUNITY)

## 2024-03-10 VITALS — BP 152/79 | HR 91 | Ht 62.0 in | Wt 132.0 lb

## 2024-03-10 DIAGNOSIS — K089 Disorder of teeth and supporting structures, unspecified: Secondary | ICD-10-CM | POA: Insufficient documentation

## 2024-03-10 DIAGNOSIS — N2889 Other specified disorders of kidney and ureter: Secondary | ICD-10-CM | POA: Diagnosis not present

## 2024-03-10 DIAGNOSIS — J449 Chronic obstructive pulmonary disease, unspecified: Secondary | ICD-10-CM

## 2024-03-10 DIAGNOSIS — F17210 Nicotine dependence, cigarettes, uncomplicated: Secondary | ICD-10-CM

## 2024-03-10 DIAGNOSIS — R911 Solitary pulmonary nodule: Secondary | ICD-10-CM

## 2024-03-10 DIAGNOSIS — Z23 Encounter for immunization: Secondary | ICD-10-CM | POA: Diagnosis present

## 2024-03-10 LAB — PULMONARY FUNCTION TEST
DL/VA % pred: 79 %
DL/VA: 3.37 ml/min/mmHg/L
DLCO unc % pred: 54 %
DLCO unc: 11.08 ml/min/mmHg
FEF 25-75 Post: 0.61 L/s
FEF 25-75 Pre: 0.58 L/s
FEF2575-%Change-Post: 5 %
FEF2575-%Pred-Post: 29 %
FEF2575-%Pred-Pre: 28 %
FEV1-%Change-Post: 0 %
FEV1-%Pred-Post: 48 %
FEV1-%Pred-Pre: 48 %
FEV1-Post: 1.2 L
FEV1-Pre: 1.19 L
FEV1FVC-%Change-Post: -4 %
FEV1FVC-%Pred-Pre: 77 %
FEV6-%Change-Post: 3 %
FEV6-%Pred-Post: 66 %
FEV6-%Pred-Pre: 64 %
FEV6-Post: 2.08 L
FEV6-Pre: 2 L
FEV6FVC-%Change-Post: -2 %
FEV6FVC-%Pred-Post: 102 %
FEV6FVC-%Pred-Pre: 104 %
FVC-%Change-Post: 5 %
FVC-%Pred-Post: 64 %
FVC-%Pred-Pre: 61 %
FVC-Post: 2.16 L
FVC-Pre: 2.05 L
Post FEV1/FVC ratio: 55 %
Post FEV6/FVC ratio: 96 %
Pre FEV1/FVC ratio: 58 %
Pre FEV6/FVC Ratio: 98 %
RV % pred: 112 %
RV: 2.15 L
TLC % pred: 79 %
TLC: 4.28 L

## 2024-03-10 MED ORDER — ALBUTEROL SULFATE (2.5 MG/3ML) 0.083% IN NEBU
2.5000 mg | INHALATION_SOLUTION | Freq: Once | RESPIRATORY_TRACT | Status: AC
  Administered 2024-03-10: 2.5 mg via RESPIRATORY_TRACT

## 2024-03-10 NOTE — Assessment & Plan Note (Addendum)
 1st detected in 2017 but increasing is since as of 02/06/24 to 3.2 cm - referred to urology 03/10/2024 >>>   Discussed in detail all the  indications, usual  risks and alternatives  relative to the benefits with patient who agrees to proceed with w/u as outlined.       Each maintenance medication was reviewed in detail including emphasizing most importantly the difference between maintenance and prns and under what circumstances the prns are to be triggered using an action plan format where appropriate.  Total time for H and P, chart review, counseling, reviewing hfa/neb device(s) and generating customized AVS unique to this office visit / same day charting = 32 min

## 2024-03-10 NOTE — Assessment & Plan Note (Addendum)
 4   min discussion re active cigarette smoking in addition to office E&M  Ask about tobacco use:   ongoing, as are all his budies  Advise quitting   in view of GOLD 3 copd, I took an extended  opportunity with this patient to outline the consequences of continued cigarette use  in airway disorders based on all the data we have from the multiple national lung health studies (perfomed over decades at millions of dollars in cost)  indicating that smoking cessation, not choice of inhalers or pulmonary physicians, is the most important aspect of his  care.   Assess willingness:  Not committed at this point Assist in quit attempt:  Per PCP when ready Arrange follow up:   Follow up per Primary Care planned

## 2024-03-10 NOTE — Progress Notes (Signed)
 Jesus Walker, male    DOB: 1960/09/18    MRN: [id number]   Brief patient profile:  12  yobf  active smoker asthma since childhood  referred to pulmonary clinic in [place]  01/15/2024 by Carlette  for abn cxr with RHD elevation x 2017     Pt not previously seen by PCCM service.    Admit date: 12/09/2023 Discharge date: 12/10/2023 Recommendations for Outpatient Follow-up:  Follow up with PCP in 1-2 weeks Continue on azithromycin  and prednisone  as prescribed to complete course of treatment Use home inhalers for shortness of breath or wheezing Counseled on alcohol cessation Continue other home medications as prior   Home Health: None Equipment/Devices: Has home oxygen   Brief/Interim Summary: Jesus Walker is a 63 y.o. male with medical history significant for COPD, seizure, hypertension, alcohol dependence. Patient presented to the ED with complaints of difficulty breathing generalized weakness and left-sided chest pain that started about 4 to 5 days ago.  Patient was admitted for evaluation of atypical chest pain is along with acute COPD exacerbation which appears to have been related.  His chest pain is already much improved and his respiratory quality has improved as well.  2D echocardiogram with preserved LVEF and no acute abnormalities noted.  His hyponatremia has improved with use of IV fluid and is back to near baseline which is in the high 120 low 130 range.  He is asymptomatic from this and has been counseled on alcohol cessation at home.  He is overall doing quite well and ambulates without any significant difficulties and is stable for discharge today.   Discharge Diagnoses:  Principal Problem:   Hyponatremia   Alcohol dependence (HCC)   COPD exacerbation (HCC)   Tobacco dependence   HTN (hypertension)   Chest pain    History of Present Illness  01/15/2024  Pulmonary/ 1st office eval/ Jesus Walker /  Office still smoking Chief Complaint  Patient presents with    Establish Care    Abn ct - shob   Dyspnea:  dropped off door maybe 2 aisles  Cough: white/ clear worse in am up to  Sleep: bed is flat  with 2 thick pillows  SABA use: seve times a day hfa - up to bid neb  02: concentrator x 2 lpm prn  Rec Plan A = Automatic = Always=    Symbicort  160 Take 2 puffs first thing in am and then another 2 puffs about 12 hours later.   Work on inhaler technique:  Plan B = Backup (to supplement plan A, not to replace it) Use your albuterol  inhaler as a rescue medication  Plan C = Crisis (instead of Plan B but only if Plan B stops working) - only use your albuterol  nebulizer if you first try Plan B  The key is to stop smoking completely before smoking completely stops you! See a dentist as soon as you can  Please schedule a follow up office visit in 6 weeks, sooner if needed  with all medications /inhalers/ solutions in hand       HRCT chest on 02/06/24  1. Lung-RADS 2, benign appearance or behavior. Continue annual screening with low-dose chest CT without contrast in 12 months. 2. Unchanged size of hypoattenuating 3.2 cm exophytic right renal interpolar lesion compared to more recent studies but slowly increasing in size from 03/07/2016. Recommend further evaluation with renal protocol MRI or CT. 3. Dilated pulmonary artery can be seen in the setting of pulmonary arterial hypertension. 4. Extensive  endobronchial secretions with chronic right middle lobe atelectasis. 5. Aortic Atherosclerosis (ICD10-I70.0) and Emphysema    03/10/2024  f/u ov/[place] office/Jesus Walker re: GOLD 3 COPD  maint on sybmbicrt 160 did not  bring meds and very easily confused with names of meds and equipment (did not know what a nebulzer was) and clearly not following the ABC plan above  Chief Complaint  Patient presents with   Shortness of Breath    PFT F/u    Dyspnea:  [employer] / slower pace than others  - does yard work [employer] but also doe s5-10 min of weed  eating. Cough:  some am x 5 min > white  Sleeping: flat bed/ 2 pillows    resp cc  SABA use: neb twice  daily and prn  02:  2lpm conc sometimes at hs   Lung cancer screening: 02/06/24  No obvious day to day or daytime variability or assoc excess/ purulent sputum or mucus plugs or hemoptysis or cp or chest tightness, subjective wheeze or overt sinus or hb symptoms.    Also denies any obvious fluctuation of symptoms with weather or environmental changes or other aggravating or alleviating factors except as outlined above   No unusual exposure hx or h/o childhood pna or knowledge of premature birth.  Current Allergies, Complete Past Medical History, Past Surgical History, Family History, and Social History were reviewed in [employer] record.  ROS  The following are not active complaints unless bolded Hoarseness, sore throat, dysphagia, dental problems, itching, sneezing,  nasal congestion or discharge of excess mucus or purulent secretions, ear ache,   fever, chills, sweats, unintended wt loss or wt gain, classically pleuritic or exertional cp,  orthopnea pnd or arm/hand swelling  or leg swelling, presyncope, palpitations, abdominal pain, anorexia, nausea, vomiting, diarrhea  or change in bowel habits or change in bladder habits, change in stools or change in urine, dysuria, hematuria,  rash, arthralgias, visual complaints, headache, numbness, weakness or ataxia or problems with walking or coordination,  change in mood or  memory.        Current Meds  Medication Sig   acetaminophen  (TYLENOL ) 500 MG tablet Take 500 mg by mouth 2 (two) times daily.   albuterol  (PROAIR  HFA) 108 (90 Base) MCG/ACT inhaler 2 puffs every 4 hours as needed only  if your can't catch your breath   albuterol  (PROVENTIL ) (2.5 MG/3ML) 0.083% nebulizer solution Take 2.5 mg by nebulization 4 (four) times daily.   amLODipine  (NORVASC ) 10 MG tablet Take 10 mg by mouth daily.   atorvastatin  (LIPITOR) 10 MG  tablet Take 10 mg by mouth daily.   budesonide -formoterol  (SYMBICORT ) 160-4.5 MCG/ACT inhaler Inhale 2 puffs into the lungs 2 (two) times daily.   folic acid  (FOLVITE ) 1 MG tablet Take 1 tablet (1 mg total) by mouth daily.   ipratropium-albuterol  (DUONEB) 0.5-2.5 (3) MG/3ML SOLN Take 3 mLs by nebulization every 4 (four) hours as needed (SOB/wheezing).   metoprolol  tartrate (LOPRESSOR ) 25 MG tablet Take 25 mg by mouth 2 (two) times daily.   naproxen  (NAPROSYN ) 500 MG tablet Take 500 mg by mouth daily.   omeprazole (PRILOSEC) 20 MG capsule Take 20 mg by mouth daily.   thiamine  (VITAMIN B-1) 100 MG tablet Take 1 tablet (100 mg total) by mouth daily.            Past Medical History:  Diagnosis Date   Alcohol dependence (HCC)    Asthma    COPD (chronic obstructive pulmonary disease) (  HCC)    GERD (gastroesophageal reflux disease)    Hypertension    Substance abuse (HCC)       Objective:    Wt Readings from Last 3 Encounters:  03/10/24 132 lb (59.9 kg)  01/15/24 131 lb (59.4 kg)  12/09/23 123 lb 11.2 oz (56.1 kg)      Vital signs reviewed  03/10/2024  - Note at rest 02 sats  96% on RA   General appearance:    amb bm mad   HEENT : Oropharynx  clear/ poor dentition        NECK :  without  apparent JVD/ palpable Nodes/TM    LUNGS: no acc muscle use,  Mild barrel  contour chest wall with bilateral  Distant bs s audible wheeze and  without cough on insp or exp maneuvers  and mild  Hyperresonant  to  percussion bilaterally     CV:  RRR  no s3 or murmur or increase in P2, and no edema   ABD:  soft and nontender   MS:  Nl gait/ ext warm without deformities Or obvious joint restrictions  calf tenderness, cyanosis or clubbing     SKIN: warm and dry without lesions    NEURO:  alert, approp, nl sensorium with  no motor or cerebellar deficits apparent.                Assessment   Assessment & Plan COPD GOLD 0/AB Active smoker -- Spirometry 07/07/2018  FEV1 1.36 (60%)  Ratio  0.78 with straight exp f/v - LDSCT 10/31/23  Mild-to-moderate paraseptal and centrilobular emphysema - 01/15/2024  After extensive coaching inhaler device,  effectiveness =    50% (short T)  - 01/15/2024   Walked on RA  x  3  lap(s) =  approx 450  ft  @ slow pace, stopped due to end of study with lowest 02 sats 93% - 01/15/2024  After extensive coaching inhaler device,  effectiveness =   50% (short ti) with hfa > continue symbicort  160 with more approp saba  03/10/2024  After extensive coaching inhaler device,  effectiveness =    70% hfa (short Ti)   PFT's  03/10/2024   FEV1 1.20 (48 % ) ratio 0.55  p 0 % improvement from saba p 0 prior to study with DLCO  11.0 (54%)   and FV curve classic mild concavity  >>>  continue symbiocrt 160 since breztri not covered and insight into use of meds is so limited need to keep it simple        Renal mass, right 1st detected in 2017 but increasing is since as of 02/06/24 to 3.2 cm - referred to urology 03/10/2024 >>>   Discussed in detail all the  indications, usual  risks and alternatives  relative to the benefits with patient who agrees to proceed with w/u as outlined.       Each maintenance medication was reviewed in detail including emphasizing most importantly the difference between maintenance and prns and under what circumstances the prns are to be triggered using an action plan format where appropriate.  Total time for H and P, chart review, counseling, reviewing hfa/neb device(s) and generating customized AVS unique to this office visit / same day charting = 32 min       Cigarette smoker 4   min discussion re active cigarette smoking in addition to office E&M  Ask about tobacco use:   ongoing, as are all his budies  Advise quitting  in view of GOLD 3 copd, I took an extended  opportunity with this patient to outline the consequences of continued cigarette use  in airway disorders based on all the data we have from the multiple national lung health  studies (perfomed over decades at millions of dollars in cost)  indicating that smoking cessation, not choice of inhalers or pulmonary physicians, is the most important aspect of his  care.   Assess willingness:  Not committed at this point Assist in quit attempt:  Per PCP when ready Arrange follow up:   Follow up per Primary Care planned           AVS  Patient Instructions  Plan A = Automatic = Always=    Symbicort  160 Take 2 puffs first thing in am and then another 2 puffs about 12 hours later.    Work on inhaler technique:  relax and gently blow all the way out then take a nice smooth full deep breath back in, triggering the inhaler at same time you start breathing in.  Hold breath in for at least  5 seconds if you can. Blow out symbicort   thru nose. Rinse and gargle with water when done.  If mouth or throat bother you at all,  try brushing teeth/gums/tongue with arm and hammer toothpaste/ make a slurry and gargle and spit out.      Plan B = Backup (to supplement plan A, not to replace it) Use your albuterol  inhaler as a rescue medication to be used if you can't catch your breath by resting or slowing your pace  or doing a relaxed purse lip breathing pattern.  - The less you use it, the better it will work when you need it. - Ok to use the inhaler up to 2 puffs  every 4 hours if you must but call for appointment if use goes up over your usual need - Don't leave home without it !!  (think of it like the spare tire or starter fluid for your car)   Plan C = Crisis (instead of Plan B but only if Plan B stops working) - only use your albuterol  nebulizer if you first try Plan B and it fails to help > ok to use the nebulizer up to every 4 hours but if start needing it regularly call for immediate appointment  The key is to stop smoking completely before smoking completely stops you!  For cough/ congestion > mucinex  or mucinex  dm  up to maximum of  1200 mg every 12 hours as needed     See  a dentist as soon as you can     Please schedule a follow up visit in 6  months but call sooner if needed  with all medications /inhalers/ solutions in hand so we can verify exactly what you are taking. This includes all medications from all doctors and over the counters    Ozell America, MD 03/10/2024

## 2024-03-10 NOTE — Patient Instructions (Addendum)
 Plan A = Automatic = Always=    Symbicort  160 Take 2 puffs first thing in am and then another 2 puffs about 12 hours later.    Work on inhaler technique:  relax and gently blow all the way out then take a nice smooth full deep breath back in, triggering the inhaler at same time you start breathing in.  Hold breath in for at least  5 seconds if you can. Blow out symbicort   thru nose. Rinse and gargle with water when done.  If mouth or throat bother you at all,  try brushing teeth/gums/tongue with arm and hammer toothpaste/ make a slurry and gargle and spit out.      Plan B = Backup (to supplement plan A, not to replace it) Use your albuterol  inhaler as a rescue medication to be used if you can't catch your breath by resting or slowing your pace  or doing a relaxed purse lip breathing pattern.  - The less you use it, the better it will work when you need it. - Ok to use the inhaler up to 2 puffs  every 4 hours if you must but call for appointment if use goes up over your usual need - Don't leave home without it !!  (think of it like the spare tire or starter fluid for your car)   Plan C = Crisis (instead of Plan B but only if Plan B stops working) - only use your albuterol  nebulizer if you first try Plan B and it fails to help > ok to use the nebulizer up to every 4 hours but if start needing it regularly call for immediate appointment  The key is to stop smoking completely before smoking completely stops you!  For cough/ congestion > mucinex  or mucinex  dm  up to maximum of  1200 mg every 12 hours as needed     See a dentist as soon as you can     Please schedule a follow up visit in 6  months but call sooner if needed  with all medications /inhalers/ solutions in hand so we can verify exactly what you are taking. This includes all medications from all doctors and over the counters

## 2024-03-10 NOTE — Assessment & Plan Note (Addendum)
 Active smoker -- Spirometry 07/07/2018  FEV1 1.36 (60%)  Ratio 0.78 with straight exp f/v - LDSCT 10/31/23  Mild-to-moderate paraseptal and centrilobular emphysema - 01/15/2024  After extensive coaching inhaler device,  effectiveness =    50% (short T)  - 01/15/2024   Walked on RA  x  3  lap(s) =  approx 450  ft  @ slow pace, stopped due to end of study with lowest 02 sats 93% - 01/15/2024  After extensive coaching inhaler device,  effectiveness =   50% (short ti) with hfa > continue symbicort  160 with more approp saba  03/10/2024  After extensive coaching inhaler device,  effectiveness =    70% hfa (short Ti)   PFT's  03/10/2024   FEV1 1.20 (48 % ) ratio 0.55  p 0 % improvement from saba p 0 prior to study with DLCO  11.0 (54%)   and FV curve classic mild concavity  >>>  continue symbiocrt 160 since breztri not covered and insight into use of meds is so limited need to keep it simple

## 2024-04-11 ENCOUNTER — Encounter (INDEPENDENT_AMBULATORY_CARE_PROVIDER_SITE_OTHER): Payer: Self-pay | Admitting: *Deleted
